# Patient Record
Sex: Male | Born: 1940 | Race: White | Hispanic: No | Marital: Married | Smoking: Former smoker
Health system: Southern US, Community
[De-identification: ages and names within clinical notes are randomized; demographics above are authoritative.]

## PROBLEM LIST (undated history)

## (undated) DIAGNOSIS — I10 Essential (primary) hypertension: Secondary | ICD-10-CM

## (undated) DIAGNOSIS — K5190 Ulcerative colitis, unspecified, without complications: Secondary | ICD-10-CM

## (undated) DIAGNOSIS — C801 Malignant (primary) neoplasm, unspecified: Secondary | ICD-10-CM

## (undated) DIAGNOSIS — M1990 Unspecified osteoarthritis, unspecified site: Secondary | ICD-10-CM

## (undated) DIAGNOSIS — F419 Anxiety disorder, unspecified: Secondary | ICD-10-CM

## (undated) DIAGNOSIS — D689 Coagulation defect, unspecified: Secondary | ICD-10-CM

## (undated) DIAGNOSIS — E785 Hyperlipidemia, unspecified: Secondary | ICD-10-CM

## (undated) DIAGNOSIS — D126 Benign neoplasm of colon, unspecified: Secondary | ICD-10-CM

## (undated) HISTORY — DX: Coagulation defect, unspecified: D68.9

## (undated) HISTORY — DX: Benign neoplasm of colon, unspecified: D12.6

## (undated) HISTORY — DX: Hyperlipidemia, unspecified: E78.5

## (undated) HISTORY — PX: JOINT REPLACEMENT: SHX530

## (undated) HISTORY — DX: Malignant (primary) neoplasm, unspecified: C80.1

## (undated) HISTORY — DX: Essential (primary) hypertension: I10

## (undated) HISTORY — PX: COLONOSCOPY W/ BIOPSIES AND POLYPECTOMY: SHX1376

## (undated) HISTORY — PX: TOTAL HIP ARTHROPLASTY: SHX124

## (undated) HISTORY — DX: Unspecified osteoarthritis, unspecified site: M19.90

## (undated) HISTORY — DX: Anxiety disorder, unspecified: F41.9

## (undated) HISTORY — DX: Ulcerative colitis, unspecified, without complications: K51.90

---

## 1989-10-03 DIAGNOSIS — C679 Malignant neoplasm of bladder, unspecified: Secondary | ICD-10-CM

## 1989-10-03 HISTORY — PX: OTHER SURGICAL HISTORY: SHX169

## 1989-10-03 HISTORY — DX: Malignant neoplasm of bladder, unspecified: C67.9

## 2011-11-03 ENCOUNTER — Encounter: Payer: Self-pay | Admitting: *Deleted

## 2011-11-03 DIAGNOSIS — I10 Essential (primary) hypertension: Secondary | ICD-10-CM | POA: Insufficient documentation

## 2011-11-03 DIAGNOSIS — I2699 Other pulmonary embolism without acute cor pulmonale: Secondary | ICD-10-CM

## 2011-11-03 DIAGNOSIS — N3090 Cystitis, unspecified without hematuria: Secondary | ICD-10-CM

## 2011-11-03 DIAGNOSIS — I82409 Acute embolism and thrombosis of unspecified deep veins of unspecified lower extremity: Secondary | ICD-10-CM

## 2011-11-03 DIAGNOSIS — F419 Anxiety disorder, unspecified: Secondary | ICD-10-CM

## 2011-11-03 DIAGNOSIS — M1990 Unspecified osteoarthritis, unspecified site: Secondary | ICD-10-CM | POA: Insufficient documentation

## 2011-11-04 ENCOUNTER — Ambulatory Visit (INDEPENDENT_AMBULATORY_CARE_PROVIDER_SITE_OTHER): Payer: Medicare Other | Admitting: Family Medicine

## 2011-11-04 ENCOUNTER — Encounter: Payer: Self-pay | Admitting: Family Medicine

## 2011-11-04 VITALS — BP 120/77 | HR 69 | Temp 97.1°F | Resp 20 | Ht 73.0 in | Wt 209.0 lb

## 2011-11-04 DIAGNOSIS — M109 Gout, unspecified: Secondary | ICD-10-CM

## 2011-11-04 DIAGNOSIS — E785 Hyperlipidemia, unspecified: Secondary | ICD-10-CM

## 2011-11-04 DIAGNOSIS — E782 Mixed hyperlipidemia: Secondary | ICD-10-CM

## 2011-11-04 DIAGNOSIS — I10 Essential (primary) hypertension: Secondary | ICD-10-CM

## 2011-11-04 DIAGNOSIS — Z0000 Encounter for general adult medical examination without abnormal findings: Secondary | ICD-10-CM

## 2011-11-04 LAB — COMPREHENSIVE METABOLIC PANEL
AST: 16 U/L (ref 0–37)
Albumin: 4.7 g/dL (ref 3.5–5.2)
Alkaline Phosphatase: 70 U/L (ref 39–117)
BUN: 26 mg/dL — ABNORMAL HIGH (ref 6–23)
Creat: 1.12 mg/dL (ref 0.50–1.35)
Glucose, Bld: 99 mg/dL (ref 70–99)
Potassium: 3.9 mEq/L (ref 3.5–5.3)
Total Bilirubin: 0.6 mg/dL (ref 0.3–1.2)

## 2011-11-04 LAB — LIPID PANEL
HDL: 36 mg/dL — ABNORMAL LOW (ref >39)
LDL Cholesterol: 147 mg/dL — ABNORMAL HIGH (ref 0–99)
Total CHOL/HDL Ratio: 7.2 Ratio
Triglycerides: 382 mg/dL — ABNORMAL HIGH (ref <150)

## 2011-11-04 LAB — URIC ACID: Uric Acid, Serum: 5.9 mg/dL (ref 4.0–7.8)

## 2011-11-04 MED ORDER — FENOFIBRATE 145 MG PO TABS: 145.0000 mg | ORAL_TABLET | Freq: Every day | ORAL | Status: DC

## 2011-11-04 MED ORDER — LOSARTAN POTASSIUM-HCTZ 50-12.5 MG PO TABS: 1.0000 | ORAL_TABLET | Freq: Every day | ORAL | Status: DC

## 2011-11-04 NOTE — Progress Notes (Deleted)
  Subjective:    Patient ID: Paul Luna, male    DOB: 31-May-1941, 71 y.o.   MRN: [id number]  HPI    Review of Systems     Objective:   Physical Exam        Assessment & Plan:

## 2011-11-04 NOTE — Progress Notes (Signed)
Subjective:    Paul Luna is a 71 y.o. male who presents for Medicare Initial preventive examination. He presents a document which is a summary of results for ARIC (a study being conducted a [employer]) in which the patient has been an active participant for 25 years. (these will be scanned into the record).  Preventive Screening-Counseling & Management  Tobacco History  Smoking status  . Former Smoker  . Quit date: 10/03/1989  Smokeless tobacco  . Not on file    Problems Prior to Visit 1. 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 HTN- normal ECG  July 2012 4. Arthritis 5. 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 Gout 7. Chronic Anxiety  Current Problems (verified) Patient Active Problem List  Diagnoses  . Pulmonary emboli  . DVT (deep venous thrombosis)  . Hypertension  . Arthritis  . Anxiety  . Bladder infection    Medications Prior to Visit Current Outpatient Prescriptions on File Prior to Visit  Medication Sig Dispense Refill  . ALLOPURINOL PO Take 100 mg by mouth.       . Sertraline HCl (ZOLOFT PO) Take 100 mg by mouth daily.       . Warfarin Sodium (COUMADIN PO) Take 5 mg by mouth daily.         Current Medications (verified) Current Outpatient Prescriptions  Medication Sig Dispense Refill  . acetaminophen (TYLENOL) 100 MG/ML solution Take 10 mg/kg by mouth as needed.      . ALLOPURINOL PO Take 100 mg by mouth.       . fenofibrate (TRICOR) 145 MG tablet Take 1 tablet (145 mg total) by mouth daily.  90 tablet  1  . Sertraline HCl (ZOLOFT PO) Take 100 mg by mouth daily.       . Warfarin Sodium (COUMADIN PO) Take 5 mg by mouth daily.       Marland Kitchen losartan-hydrochlorothiazide (HYZAAR) 50-12.5 MG per tablet Take 1 tablet by mouth daily.  90 tablet  3     Allergies (verified) Iodine and Shellfish allergy   PAST HISTORY  Family  History Family History  Problem Relation Age of Onset  . Hypertension Mother   . Heart disease Mother 49  . Hypertension Father   . Arthritis Father   . Stroke Father 78  . Cancer Brother   . Heart disease Brother 30    Social History History  Substance Use Topics  . Smoking status: Former Smoker    Quit date: 10/03/1989  . Smokeless tobacco: Not on file  . Alcohol Use: No    Are there smokers in your home (other than you)?  No  Risk Factors Current exercise habits: Home exercise routine includes walking 30 minutes daily hrs.  Dietary issues discussed: Heart Healthy; Gout prevention.  Cardiac risk factors: advanced age (older than 55 for men, 43 for women), dyslipidemia, hypertension, male gender and smoking/ tobacco exposure.  Depression Screen      ( Beck's Depression Inventory was completed) (Note: if answer to either of the following is "Yes", a more complete depression screening is indicated)   Q1: Over the past two weeks, have you felt down, depressed or hopeless? No  Q2: Over the past two weeks, have you felt little interest or pleasure in doing things? No  Have you lost interest or pleasure in daily life? No  Do you often feel hopeless? No  Do you cry easily over simple problems? No  Activities of Daily Living In your present state of health, do you have any difficulty performing the following activities?:  Driving? No Managing money?  No Feeding yourself? No Getting from bed to chair? No Climbing a flight of stairs? No Preparing food and eating?: No Bathing or showering? No Getting dressed: No Getting to the toilet? No Using the toilet:No Moving around from place to place: No In the past year have you fallen or had a near fall?:No   Are you sexually active?  No  Do you have more than one partner?  No  Hearing Difficulties: No Do you often ask people to speak up or repeat themselves? No Do you experience ringing or noises in your ears? No Do you have  difficulty understanding soft or whispered voices? No   Do you feel that you have a problem with memory? No  Do you often misplace items? No  Do you feel safe at home?  No  Cognitive Testing  Alert? Yes  Normal Appearance?Yes  Oriented to person? No  Place? No   Time? Yes  Recall of three objects?   Not performed  Can perform simple calculations?  Not performed  Displays appropriate judgment?Yes  Can read the correct time from a watch face?Yes   Advanced Directives have been discussed with the patient? No   List the Names of Other Physician/Practitioners you currently use: 1.  Dr. Logan Bores- [facility]  Indicate any recent Medical Services you may have received from other than [facility] providers in the past year (date may be approximate).                                       Screening Tests Health Maintenance  Topic Date Due  . Tetanus/tdap  01/05/2011  . Zostavax  01/09/2003  . Pneumococcal Polysaccharide Vaccine Age 41 And Over  01/09/2008  . Influenza Vaccine  07/04/2011  . Colonoscopy  03/03/2021    All answers were reviewed with the patient and necessary referrals were made:  [facility], MD   11/04/2011   History reviewed: allergies, current medications, past family history, past medical history, past social history, past surgical history and problem list  Review of Systems A comprehensive review of systems was negative except as per patient history.    Objective:     Vision by Snellen chart: right eye:, left eye:pt has eye exam done by [employer]; he wears prescription lenses Blood pressure 120/77, pulse 69, temperature 97.1 F (36.2 C), temperature source Oral, resp. rate 20, height 6\' 1"  (1.854 m), weight 209 lb (94.802 kg). Body mass index is 27.57 kg/(m^2).  General appearance: alert, cooperative, appears stated age and no distress  HEENT: Negative  Neck: Supple, normal ROM; no Thyromegaly, no LAN, no JVD, no bruits  Resp: nl effort, clear in  all fields w/o wheezes or rales  CV: reg rate and rhythm; no murmurs, gallops, rubs  Abd: soft, flat, nontender, no masses or organomegaly; no hernias  GU: DRE- prostate slightly enlarged, nontender; no rectal masses  Skin: warm and dry; no abnormal lesions or rashes  Ms/Sk: nl ROM in all major joints w/o erythema, swelling, atrophy, crepitus, warmth  Back: nl ROM w/o pain to palpation; no CVA tenderness  Neuro: A&O; CN 2-12 grossly intact. Motor and sensory function grossly normal. Gait is normal. DTRs 2+/equal.  Cerebral function is grossly normal.   ARIC results: 06/15/2011             Echocardiogram:  LVEF 64.7%                                                      Spirometry: Normal                                                      Total Cholesterol: 217                                                      LDL- chol: 123                                                      HDL- chol: 34                                                      TGs: 299                                                      Uric acid: 5.8                                                      Serum creatinine: 1.21   Assessment:   1. Routine general medical examination at a health care facility  Maintaining good general health; pt has a question re: use of Xarelto for anticoagulation (instead of Warfarin)the patient state this medication has been in use in [place] for years and he is considering switching to this medication for the benefits of no monthly lab test ,no dietary restrictions. I think this is reasonsable but the decision ultimately lies with Mr. Carrico.  2. Hyperlipidemia  He is trying to maintain healthy nutrition and an active lifestyle. Will advise him of results and any needed changes.  3. Gout  Uric acid checked; continue diet restrictions.   4.   Hypertension                                           Well controlled; continue current medication.        Plan:    As  noted above, Mr. Venezia will continues  current medications and follow-up with Dr.Evans at [facility] as well as [facility] in [place], [place].   During the course of the visit the patient was educated and counseled about appropriate screening and preventive services including:    Advanced directives:status unknown.  Diet review for nutrition referral? Yes ____  Not Indicated __x__   Patient Instructions (the written plan) was given to the patient.  Medicare Attestation I have personally reviewed: The patient's medical and social history Their use of alcohol, tobacco or illicit drugs Their current medications and supplements The patient's functional ability including ADLs,fall risks, home safety risks, cognitive, and hearing and visual impairment Diet and physical activities Evidence for depression or mood disorders  The patient's weight, height, BMI, and visual acuity have been recorded in the chart.  I have made referrals, counseling, and provided education to the patient based on review of the above and I have provided the patient with a written personalized care plan for preventive services.     Dow Adolph, MD   11/04/2011

## 2011-11-04 NOTE — Patient Instructions (Signed)
Today, we addressed Health Care Maintenance  And I feel that you are in good health and are compliant with your medications to insure control of your health issues. I refilled the Losartan- HCTZ and increased your Fenofibrate dose to 145 mg. Remember to separate  Warfarin from your other medications. Also begin a Vitamin D Supplement- 1000 IU daily is a good dose. You can let me know what you decide re: Xarelto.  Take care and it was a pleasure meeting you!

## 2011-11-07 ENCOUNTER — Encounter: Payer: Self-pay | Admitting: Family Medicine

## 2011-11-07 ENCOUNTER — Other Ambulatory Visit: Payer: Self-pay | Admitting: Family Medicine

## 2011-11-07 DIAGNOSIS — E785 Hyperlipidemia, unspecified: Secondary | ICD-10-CM

## 2011-11-07 NOTE — Progress Notes (Addendum)
Quick Note:  You labs are abnormal. Please call pt and notify him that his lipids are elevated. I recommend he start a medication to lower these values. I will route a prescription to his pharmacy for Pravastatin 20 mg for 4 months and schedule repeat Fasting Lipids in 8 weeks.  His uric acid level (Gout ) is normal.  After further review of the pt's meds and labs, I will not be prescribing Pravastatin 20 mg; at the visit on 11/03/10, Fenofibrate was increased to 145 mg; the pt will take this increased dose and return, as scheduled, for repeat labs in 8 weeks.  ______

## 2012-01-14 ENCOUNTER — Ambulatory Visit (INDEPENDENT_AMBULATORY_CARE_PROVIDER_SITE_OTHER): Payer: Medicare Other | Admitting: Family Medicine

## 2012-01-14 VITALS — BP 110/63 | HR 89 | Temp 98.8°F | Resp 16 | Ht 72.5 in | Wt 217.0 lb

## 2012-01-14 DIAGNOSIS — H6090 Unspecified otitis externa, unspecified ear: Secondary | ICD-10-CM

## 2012-01-14 DIAGNOSIS — H60399 Other infective otitis externa, unspecified ear: Secondary | ICD-10-CM

## 2012-01-14 DIAGNOSIS — L0390 Cellulitis, unspecified: Secondary | ICD-10-CM

## 2012-01-14 DIAGNOSIS — H6010 Cellulitis of external ear, unspecified ear: Secondary | ICD-10-CM

## 2012-01-14 DIAGNOSIS — H9209 Otalgia, unspecified ear: Secondary | ICD-10-CM

## 2012-01-14 DIAGNOSIS — L0291 Cutaneous abscess, unspecified: Secondary | ICD-10-CM

## 2012-01-14 MED ORDER — NEOMYCIN-POLYMYXIN-HC 3.5-10000-1 OT SOLN: 3.0000 [drp] | Freq: Three times a day (TID) | OTIC | Status: AC

## 2012-01-14 MED ORDER — DOXYCYCLINE HYCLATE 100 MG PO TABS: 100.0000 mg | ORAL_TABLET | Freq: Two times a day (BID) | ORAL | Status: AC

## 2012-01-14 MED ORDER — HYDROCODONE-ACETAMINOPHEN 5-500 MG PO TABS: 1.0000 | ORAL_TABLET | Freq: Three times a day (TID) | ORAL | Status: AC | PRN

## 2012-01-14 NOTE — Progress Notes (Signed)
  Subjective:    Patient ID: Paul Luna, male    DOB: 10/02/1941, 71 y.o.   MRN: [id number]  HPI 71 yo male on coumadin for h/o dvt with pe here with ear pain. Right.  Usually wears hearing aid in that ear.  Swollen, inflamed, throbbed.  Going on 4 days.  Hearing feels blocked.  NO fever.     Review of Systems Negative except as per HPI     Objective:   Physical Exam  Constitutional: He appears well-developed.  Pulmonary/Chest: Effort normal.  Neurological: He is alert.    Right ear - pinna red and inflamed.  Canal edematous - swollen shut.  Unable to see TM.  TTP and movement.   Ear wick inserted with small alligator forceps and 4 gtts of cortisporin otic instilled.       Assessment & Plan:  Ear pain Otitis externa Cellulitis  Cortisporin otic, doxy, and vicodin 5 #10.  F/u in 48 hours to have wick checked/removed if indicated.

## 2012-01-17 ENCOUNTER — Ambulatory Visit (INDEPENDENT_AMBULATORY_CARE_PROVIDER_SITE_OTHER): Payer: Medicare Other | Admitting: Family Medicine

## 2012-01-17 VITALS — BP 117/67 | HR 77 | Temp 98.0°F | Resp 16 | Ht 74.0 in | Wt 218.0 lb

## 2012-01-17 DIAGNOSIS — H6090 Unspecified otitis externa, unspecified ear: Secondary | ICD-10-CM

## 2012-01-17 DIAGNOSIS — H60399 Other infective otitis externa, unspecified ear: Secondary | ICD-10-CM

## 2012-01-17 NOTE — Progress Notes (Signed)
  Patient Name: Paul Luna Date of Birth: Jan 02, 1941 Medical Record Number: [id number] Gender: male Date of Encounter: 01/17/2012  History of Present Illness:  Paul Luna is a 71 y.o. very pleasant male patient who presents with the following:  Here today to recheck a right ear OE.   See last OV from 4/13- he had an ear wick placed for severe OE at that time.  The wick fell out yesterday and he feels that he is making steady progress. Pain is much less, hearing is better, ear canal is much less swollen and redness of pinna has resolved  Patient Active Problem List  Diagnoses  . Pulmonary emboli  . DVT (deep venous thrombosis)  . Hypertension  . Arthritis  . Anxiety  . Bladder infection   Past Medical History  Diagnosis Date  . Hypertension   . Arthritis   . 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 Anxiety     chronic   Past Surgical History  Procedure Date  . Cancer bladder 1991    managed by urologist  . Total hip arthroplasty     right   History  Substance Use Topics  . Smoking status: Former Smoker    Quit date: 10/03/1989  . Smokeless tobacco: Not on file  . Alcohol Use: No   Family History  Problem Relation Age of Onset  . Hypertension Mother   . Heart disease Mother 60  . Hypertension Father   . Arthritis Father   . Stroke Father 26  . Cancer Brother   . Heart disease Brother 81   Allergies  Allergen Reactions  . Iodine     childhood  . Shellfish Allergy Nausea And Vomiting    Gastrointestinal    Medication list has been reviewed and updated.  Review of Systems: As per HPI- otherwise negative. Feels that he is getting better- hearing in right ear better although he is not yet able to wear his hearing aid  Physical Examination: Filed Vitals:   01/17/12 0803  BP: 117/67  Pulse: 77  Temp: 98 F (36.7 C)  TempSrc: Oral  Resp: 16  Height: 6\' 2"  (1.88 m)  Weight: 218 lb (98.884 kg)    Body mass index is 27.99 kg/(m^2).  GEN: WDWN, NAD, Non-toxic, A  & O x 3 HEENT: Atraumatic, Normocephalic. Neck supple. No masses, No LAD.  Right ear: some ear canal edema but easily able to visualize TM which is intact.  No redness or swelling of pinna, no tenderness with external ear movement.  TM is dull with debris but is not red or bulging.  Left ear wnl internally and externally.   Oropharynx wnl, PEERL Ears and Nose: No external deformity. CV: RRR, No M/G/R. No JVD. No thrill. No extra heart sounds. PULM: CTA B, no wheezes, crackles, rhonchi. No retractions. No resp. distress. No accessory muscle use. ABD: S, NT, ND, +BS. No rebound. No HSM. EXTR: No c/c/e NEURO Normal gait.  PSYCH: Normally interactive. Conversant. Not depressed or anxious appearing.  Calm demeanor.    Assessment and Plan: 1. Otitis externa    Getting better-continue current regimen.  No need to replace wick today.  If not continuing to improve please let us know!  Sooner if worse.

## 2012-04-02 ENCOUNTER — Encounter: Payer: Self-pay | Admitting: Family Medicine

## 2012-04-16 ENCOUNTER — Telehealth: Payer: Self-pay

## 2012-04-16 NOTE — Telephone Encounter (Signed)
PT REQUESTING ALLOPURINAL 300 MG QTY 30,WE HAVE NOT FILLED THIS RX BEFORE,BUT DR MACPHERSON IS AWARE PT IS ON MED.   BEST PHONE NUMBER [phone]  PHARMACY [facility] @ [phone]

## 2012-04-17 MED ORDER — ALLOPURINOL 300 MG PO TABS: 300.0000 mg | ORAL_TABLET | Freq: Every day | ORAL | Status: DC

## 2012-04-17 NOTE — Telephone Encounter (Signed)
Dr. Audria Nine,   Do you want to RF?

## 2012-04-17 NOTE — Telephone Encounter (Signed)
I called the pharmacy to verify the dose ( Allopurinol 100 mg is dose on medication profile); pharmacy verified  Allopurinol 300 mg for last few months. I will authorize RFs.

## 2012-04-19 ENCOUNTER — Other Ambulatory Visit: Payer: Self-pay | Admitting: Family Medicine

## 2012-04-24 ENCOUNTER — Telehealth: Payer: Self-pay | Admitting: Family Medicine

## 2012-04-26 NOTE — Telephone Encounter (Signed)
n

## 2012-05-23 ENCOUNTER — Telehealth: Payer: Self-pay

## 2012-05-23 MED ORDER — SERTRALINE HCL 100 MG PO TABS: 100.0000 mg | ORAL_TABLET | Freq: Every day | ORAL | Status: DC

## 2012-05-23 NOTE — Telephone Encounter (Signed)
Was here in Feb for physical with Dr Audria Nine.

## 2012-05-23 NOTE — Telephone Encounter (Signed)
Pt was taking this medication at the time of his CPE in Feb 2013; I will e-prescribe refills for this medication. Please notify pt.

## 2012-05-23 NOTE — Telephone Encounter (Signed)
DR [facility]  PT WOULD LIKE A REFILL FOR HIS ZOLOFT 100 MG.  W/ 90 SUPPLY.  DR [facility] HAS NOT PRESCRIBED FOR THIS BEFORE, HE HAS GOTTEN FROM ANOTHER DR. TOLD HIM THAT HE WOULD PROBABLY NEED TO BE SEEN.

## 2012-05-23 NOTE — Telephone Encounter (Signed)
Have called patient to advise.  

## 2012-05-23 NOTE — Telephone Encounter (Signed)
Will forward to Dr. Audria Nine for review.

## 2012-06-02 ENCOUNTER — Other Ambulatory Visit: Payer: Self-pay | Admitting: Physician Assistant

## 2012-06-22 ENCOUNTER — Encounter: Payer: Self-pay | Admitting: Family Medicine

## 2012-06-22 ENCOUNTER — Ambulatory Visit (INDEPENDENT_AMBULATORY_CARE_PROVIDER_SITE_OTHER): Payer: Medicare Other | Admitting: Family Medicine

## 2012-06-22 VITALS — BP 108/71 | HR 86 | Temp 97.8°F | Resp 16 | Ht 72.75 in | Wt 214.4 lb

## 2012-06-22 DIAGNOSIS — I10 Essential (primary) hypertension: Secondary | ICD-10-CM

## 2012-06-22 DIAGNOSIS — Z23 Encounter for immunization: Secondary | ICD-10-CM

## 2012-06-22 DIAGNOSIS — E785 Hyperlipidemia, unspecified: Secondary | ICD-10-CM

## 2012-06-22 DIAGNOSIS — M109 Gout, unspecified: Secondary | ICD-10-CM | POA: Insufficient documentation

## 2012-06-22 LAB — BASIC METABOLIC PANEL
BUN: 36 mg/dL — ABNORMAL HIGH (ref 6–23)
Creat: 1.23 mg/dL (ref 0.50–1.35)

## 2012-06-22 LAB — ALT: ALT: 15 U/L (ref 0–53)

## 2012-06-22 LAB — LIPID PANEL: Cholesterol: 242 mg/dL — ABNORMAL HIGH (ref 0–200)

## 2012-06-22 MED ORDER — FENOFIBRATE 145 MG PO TABS: 145.0000 mg | ORAL_TABLET | Freq: Every day | ORAL | Status: DC

## 2012-06-22 MED ORDER — ALLOPURINOL 300 MG PO TABS: 300.0000 mg | ORAL_TABLET | Freq: Every day | ORAL | Status: DC

## 2012-06-22 NOTE — Progress Notes (Signed)
S: This pleasant 71 y.o. Cauc male is here for medication refill and labs for HTN and hyperlipidemia. He feels good and has new twin granddaughters. He has improved lifestyle choices and is compliant with medications. PT/INR is checked monthly at [facility]/ Cardiologist.  ROS: Noncontributory.  O: Filed Vitals:   06/22/12 0908  BP: 108/71  Pulse: 86  Temp: 97.8 F (36.6 C)  Resp: 16   GEN: In NAD; WN,WD. HENT: /AT: EOMI, conj/scl clear COR: RRR LUNGS; Normal resp rate and effort. NEURO: A&O x 3; CNs intact; otherwise normal.  A/P: 1. HTN (hypertension)  Basic metabolic panel  2. Hyperlipidemia  ALT, Lipid panel  3. Immunization due  Flu vaccine greater than or equal to 3yo preservative free IM  Pt advised to have Pneumovax but he declines to have it today. He will return to have it within next year (probably near next birthday).

## 2012-06-22 NOTE — Patient Instructions (Signed)
You are being reminded to have Pneumonia vaccine within the next 12 months. You will have a handout given about this vaccine.

## 2012-06-24 NOTE — Progress Notes (Signed)
Quick Note:  Please call pt and advise that the following labs are abnormal... Compared to your results from Sept 2012, your lipid profile is significantly worse. The values are only slightly better than 7 months ago. Continue your medication and aggressively change your nutrition habits. Maintain regular exercise habits; these values will be rechecked at your next visit. If there is no significant improvement /reversal of these numbers, medication change will have to occur.   Copy to pt. ______

## 2012-07-17 ENCOUNTER — Telehealth: Payer: Self-pay

## 2012-07-17 NOTE — Telephone Encounter (Signed)
The following email was submitted to your website from Paul Luna I would appreciate the posting on MyChart of the lipid panel and any other test results from my office visit on 06/22/2012.  Many thanks.  Date of birth: 01-16-1941

## 2012-07-17 NOTE — Telephone Encounter (Signed)
I sent him messages in my chart. Amy

## 2012-07-25 ENCOUNTER — Encounter: Payer: Self-pay | Admitting: Radiology

## 2012-11-03 ENCOUNTER — Telehealth: Payer: Self-pay

## 2012-11-03 ENCOUNTER — Other Ambulatory Visit: Payer: Self-pay | Admitting: Family Medicine

## 2012-11-03 NOTE — Telephone Encounter (Signed)
Pt came by and requested warfarin,lorartan,sertraline Has an appointment on 12/12/12 [facility]/ [facility]   He will call the pharmacy and ask they send these via e-script  [phone]

## 2012-11-05 NOTE — Telephone Encounter (Signed)
Looks like this was already done.

## 2012-12-12 ENCOUNTER — Encounter: Payer: Self-pay | Admitting: Family Medicine

## 2012-12-12 ENCOUNTER — Ambulatory Visit (INDEPENDENT_AMBULATORY_CARE_PROVIDER_SITE_OTHER): Payer: Medicare Other | Admitting: Family Medicine

## 2012-12-12 VITALS — BP 130/70 | HR 69 | Temp 98.5°F | Resp 16 | Ht 73.0 in | Wt 216.4 lb

## 2012-12-12 DIAGNOSIS — E785 Hyperlipidemia, unspecified: Secondary | ICD-10-CM

## 2012-12-12 DIAGNOSIS — Z0000 Encounter for general adult medical examination without abnormal findings: Secondary | ICD-10-CM

## 2012-12-12 DIAGNOSIS — Z8551 Personal history of malignant neoplasm of bladder: Secondary | ICD-10-CM | POA: Insufficient documentation

## 2012-12-12 DIAGNOSIS — Z130 Encounter for screening for diseases of the blood and blood-forming organs and certain disorders involving the immune mechanism: Secondary | ICD-10-CM

## 2012-12-12 DIAGNOSIS — I10 Essential (primary) hypertension: Secondary | ICD-10-CM

## 2012-12-12 DIAGNOSIS — Z23 Encounter for immunization: Secondary | ICD-10-CM

## 2012-12-12 LAB — COMPREHENSIVE METABOLIC PANEL
Albumin: 4.7 g/dL (ref 3.5–5.2)
BUN: 28 mg/dL — ABNORMAL HIGH (ref 6–23)
Calcium: 9.3 mg/dL (ref 8.4–10.5)
Chloride: 106 mEq/L (ref 96–112)
Glucose, Bld: 96 mg/dL (ref 70–99)
Potassium: 4.1 mEq/L (ref 3.5–5.3)
Total Protein: 7.1 g/dL (ref 6.0–8.3)

## 2012-12-12 LAB — POCT URINALYSIS DIPSTICK
Glucose, UA: NEGATIVE
Ketones, UA: NEGATIVE
Leukocytes, UA: NEGATIVE
Spec Grav, UA: 1.02
Urobilinogen, UA: 0.2

## 2012-12-12 LAB — CBC WITH DIFFERENTIAL/PLATELET
Basophils Relative: 1 % (ref 0–1)
Hemoglobin: 14.9 g/dL (ref 13.0–17.0)
Lymphs Abs: 1.1 10*3/uL (ref 0.7–4.0)
MCHC: 35 g/dL (ref 30.0–36.0)
Monocytes Relative: 11 % (ref 3–12)
Neutro Abs: 3.3 10*3/uL (ref 1.7–7.7)
Neutrophils Relative %: 63 % (ref 43–77)
Platelets: 274 10*3/uL (ref 150–400)
RBC: 4.87 MIL/uL (ref 4.22–5.81)

## 2012-12-12 LAB — LIPID PANEL: Cholesterol: 234 mg/dL — ABNORMAL HIGH (ref 0–200)

## 2012-12-12 MED ORDER — SERTRALINE HCL 100 MG PO TABS: 100.0000 mg | ORAL_TABLET | Freq: Every day | ORAL | Status: DC

## 2012-12-12 MED ORDER — LOSARTAN POTASSIUM-HCTZ 50-12.5 MG PO TABS: 1.0000 | ORAL_TABLET | Freq: Every day | ORAL | Status: DC

## 2012-12-12 MED ORDER — FENOFIBRATE 145 MG PO TABS: 145.0000 mg | ORAL_TABLET | Freq: Every day | ORAL | Status: DC

## 2012-12-12 NOTE — Patient Instructions (Signed)
Keeping you healthy  Get these tests  Blood pressure- Have your blood pressure checked once a year by your healthcare provider.  Normal blood pressure is 120/80  Weight- Have your body mass index (BMI) calculated to screen for obesity.  BMI is a measure of body fat based on height and weight. You can also calculate your own BMI at [email or web address].  Cholesterol- Have your cholesterol checked every year.  Diabetes- Have your blood sugar checked regularly if you have high blood pressure, high cholesterol, have a family history of diabetes or if you are overweight.  Screening for Colon Cancer- Colonoscopy starting at age 57.  Screening may begin sooner depending on your family history and other health conditions. Follow up colonoscopy as directed by your Gastroenterologist.  Screening for Prostate Cancer- Both blood work (PSA) and a rectal exam help screen for Prostate Cancer.  Screening begins at age 70 with African-American men and at age 18 with Caucasian men.  Screening may begin sooner depending on your family history.  Take these medicines  Aspirin- One aspirin daily can help prevent Heart disease and Stroke.  Flu shot- Every fall.  Tetanus- Every 10 years. UTD- 2012.  Zostavax- Once after the age of 98 to prevent Shingles. You received a prescription to have this vaccine given at  [facility] or [address].  Pneumonia shot- Once after the age of 72; if you are younger than 51, ask your healthcare provider if you need a Pneumonia shot. You received this vaccine today.  Take these steps  Don't smoke- If you do smoke, talk to your doctor about quitting.  For tips on how to quit, go to www.smokefree.gov or call 1-800-QUIT-NOW.  Be physically active- Exercise 5 days a week for at least 30 minutes.  If you are not already physically active start slow and gradually work up to 30 minutes of moderate physical activity.  Examples of moderate activity include walking briskly,  mowing the yard, dancing, swimming, bicycling, etc.  Eat a healthy diet- Eat a variety of healthy food such as fruits, vegetables, low fat milk, low fat cheese, yogurt, lean meant, poultry, fish, beans, tofu, etc. For more information go to www.thenutritionsource.org  Drink alcohol in moderation- Limit alcohol intake to less than two drinks a day. Never drink and drive.  Dentist- Brush and floss twice daily; visit your dentist twice a year.  Depression- Your emotional health is as important as your physical health. If you're feeling down, or losing interest in things you would normally enjoy please talk to your healthcare provider.  Eye exam- Visit your eye doctor every year.  Safe sex- If you may be exposed to a sexually transmitted infection, use a condom.  Seat belts- Seat belts can save your life; always wear one.  Smoke/Carbon Monoxide detectors- These detectors need to be installed on the appropriate level of your home.  Replace batteries at least once a year.  Skin cancer- When out in the sun, cover up and use sunscreen 15 SPF or higher.  Violence- If anyone is threatening you, please tell your healthcare provider.  Living Will/ Health care power of attorney- Speak with your healthcare provider and family.   Remember to ask your Urologist of he does the stool test for blood (which should be done yearly) when he does your prostate exam.

## 2012-12-12 NOTE — Progress Notes (Signed)
Subjective:    Patient ID: Paul Luna, male    DOB: 14-Apr-1941, 72 y.o.   MRN: [id number]  HPI  This 72 y.o. Cauc male is here for [facility] annual exam; he feels well. Daily exercise involves  walking dogs for 30 minutes; weight reduction is one of his concerns.   He enjoys an occasion beer or glass of wine. He is married and wife is currently hospitalized w/  GI problem; she is having a colonoscopy this afternoon.   He has bladder cancer follow-up annually in [place], [place] with [facility]-  Dr. Orlinda Blalock. At last visit in October 2013, DRE , PSA and cysto were performed.  Pt thinks physician performed stool test for occult blood. Last colonoscopy was in [place], [place]  in June 2012 (normal).   Pt is on chronic anticoagulation (Warfarin); INR is checked every 6 weeks at [facility].    Review of Systems  Constitutional: Negative.   HENT: Negative.   Eyes: Negative.   Respiratory: Negative.   Cardiovascular: Negative.   Gastrointestinal: Negative.   Endocrine: Negative.   Genitourinary: Positive for frequency.  Musculoskeletal: Negative.   Skin: Negative.   Allergic/Immunologic: Negative.   Neurological: Negative.   Hematological: Negative.   Psychiatric/Behavioral: Negative.        Objective:   Physical Exam  Nursing note and vitals reviewed. Constitutional: He is oriented to person, place, and time. Vital signs are normal. He appears well-developed and well-nourished. No distress.  HENT:  Head: Normocephalic and atraumatic.  Right Ear: Hearing, external ear and ear canal normal. Tympanic membrane is scarred. Tympanic membrane is not erythematous and not retracted. No middle ear effusion.  Left Ear: Hearing, external ear and ear canal normal. Tympanic membrane is scarred. Tympanic membrane is not erythematous and not retracted.  No middle ear effusion.  Nose: Nose normal. No mucosal edema, nasal deformity or septal deviation.  Mouth/Throat:  Uvula is midline, oropharynx is clear and moist and mucous membranes are normal. No oral lesions. Normal dentition. No dental caries.  Eyes: Conjunctivae and EOM are normal. Pupils are equal, round, and reactive to light. No scleral icterus.  Neck: Normal range of motion. Neck supple. No JVD present. No thyromegaly present.  Cardiovascular: Normal rate, regular rhythm, normal heart sounds and intact distal pulses.  Exam reveals no gallop and no friction rub.   No murmur heard. Pulmonary/Chest: Effort normal and breath sounds normal. No respiratory distress.  Abdominal: Soft. Normal appearance and bowel sounds are normal. He exhibits no distension, no pulsatile midline mass and no mass. There is no hepatosplenomegaly. There is no tenderness. There is no guarding and no CVA tenderness. No hernia.  Genitourinary:  Deferred- sees Urologist once a year  Musculoskeletal: Normal range of motion. He exhibits no edema and no tenderness.  Lymphadenopathy:    He has no cervical adenopathy.  Neurological: He is alert and oriented to person, place, and time. He has normal strength. No cranial nerve deficit or sensory deficit. He exhibits normal muscle tone. Coordination and gait normal.  Reflex Scores:      Tricep reflexes are 1+ on the right side and 1+ on the left side.      Bicep reflexes are 1+ on the right side and 1+ on the left side.      Patellar reflexes are 1+ on the right side and 1+ on the left side. Skin: Skin is warm and dry. No rash noted. No erythema. No pallor.  Psychiatric: He  has a normal mood and affect. His behavior is normal. Judgment and thought content normal.    Results for orders placed in visit on 12/12/12  POCT URINALYSIS DIPSTICK      Result Value Range   Color, UA yellow     Clarity, UA clear     Glucose, UA neg     Bilirubin, UA neg     Ketones, UA neg     Spec Grav, UA 1.020     Blood, UA neg     pH, UA 6.0     Protein, UA neg     Urobilinogen, UA 0.2     Nitrite,  UA neg     Leukocytes, UA Negative           Assessment & Plan:  Routine general medical examination at a health care facility - Plan: POCT urinalysis dipstick, CANCELED: POCT UA - Microscopic Only  Other and unspecified hyperlipidemia - continue Fenofibrate; add Fish Oil capsule cap daily.  Plan: Lipid panel, Comprehensive metabolic panel  Essential hypertension, benign - Plan: CBC with Differential  Screening for endocrine, nutritional, metabolic and immunity disorder  Need for prophylactic vaccination against Streptococcus pneumoniae (pneumococcus) - Plan: Pneumococcal polysaccharide vaccine 23-valent greater than or equal to 2yo subcutaneous/IM   Meds ordered this encounter  Medications  . losartan-hydrochlorothiazide (HYZAAR) 50-12.5 MG per tablet    Sig: Take 1 tablet by mouth daily.    Dispense:  90 tablet    Refill:  3  . sertraline (ZOLOFT) 100 MG tablet    Sig: Take 1 tablet (100 mg total) by mouth daily.    Dispense:  90 tablet    Refill:  3

## 2012-12-14 ENCOUNTER — Encounter: Payer: Self-pay | Admitting: Family Medicine

## 2013-02-18 ENCOUNTER — Other Ambulatory Visit: Payer: Self-pay | Admitting: Family Medicine

## 2013-04-02 ENCOUNTER — Other Ambulatory Visit: Payer: Self-pay | Admitting: Neurosurgery

## 2013-04-02 ENCOUNTER — Ambulatory Visit: Payer: Medicare Other

## 2013-04-02 ENCOUNTER — Ambulatory Visit (INDEPENDENT_AMBULATORY_CARE_PROVIDER_SITE_OTHER): Payer: Medicare Other | Admitting: Family Medicine

## 2013-04-02 VITALS — BP 122/74 | HR 66 | Temp 97.7°F | Resp 16 | Ht 73.0 in | Wt 217.0 lb

## 2013-04-02 DIAGNOSIS — M5430 Sciatica, unspecified side: Secondary | ICD-10-CM

## 2013-04-02 DIAGNOSIS — M216X9 Other acquired deformities of unspecified foot: Secondary | ICD-10-CM

## 2013-04-02 DIAGNOSIS — M21379 Foot drop, unspecified foot: Secondary | ICD-10-CM

## 2013-04-02 DIAGNOSIS — M549 Dorsalgia, unspecified: Secondary | ICD-10-CM

## 2013-04-02 NOTE — Patient Instructions (Addendum)
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 Go see the neuro surgeon today at [facility]. They are at [address].    Sciatica Sciatica is pain, weakness, numbness, or tingling along the path of the sciatic nerve. The nerve starts in the lower back and runs down the back of each leg. The nerve controls the muscles in the lower leg and in the back of the knee, while also providing sensation to the back of the thigh, lower leg, and the sole of your foot. Sciatica is a symptom of another medical condition. For instance, nerve damage or certain conditions, such as a herniated disk or bone spur on the spine, pinch or put pressure on the sciatic nerve. This causes the pain, weakness, or other sensations normally associated with sciatica. Generally, sciatica only affects one side of the body. CAUSES   Herniated or slipped disc.  Degenerative disk disease.  A pain disorder involving the narrow muscle in the buttocks (piriformis syndrome).  Pelvic injury or fracture.  Pregnancy.  Tumor (rare). SYMPTOMS  Symptoms can vary from mild to very severe. The symptoms usually travel from the low back to the buttocks and down the back of the leg. Symptoms can include:  Mild tingling or dull aches in the lower back, leg, or hip.  Numbness in the back of the calf or sole of the foot.  Burning sensations in the lower back, leg, or hip.  Sharp pains in the lower back, leg, or hip.  Leg weakness.  Severe back pain inhibiting movement. These symptoms may get worse with coughing, sneezing, laughing, or prolonged sitting or standing. Also, being overweight may worsen symptoms. DIAGNOSIS  Your caregiver will perform a physical exam to look for common symptoms of sciatica. He or she may ask you to do certain movements or activities that would trigger sciatic nerve pain. Other tests may be performed to find the cause of the sciatica. These may include:  Blood  tests.  X-rays.  Imaging tests, such as an MRI or CT scan. TREATMENT  Treatment is directed at the cause of the sciatic pain. Sometimes, treatment is not necessary and the pain and discomfort goes away on its own. If treatment is needed, your caregiver may suggest:  Over-the-counter medicines to relieve pain.  Prescription medicines, such as anti-inflammatory medicine, muscle relaxants, or narcotics.  Applying heat or ice to the painful area.  Steroid injections to lessen pain, irritation, and inflammation around the nerve.  Reducing activity during periods of pain.  Exercising and stretching to strengthen your abdomen and improve flexibility of your spine. Your caregiver may suggest losing weight if the extra weight makes the back pain worse.  Physical therapy.  Surgery to eliminate what is pressing or pinching the nerve, such as a bone spur or part of a herniated disk. HOME CARE INSTRUCTIONS   Only take over-the-counter or prescription medicines for pain or discomfort as directed by your caregiver.  Apply ice to the affected area for 20 minutes, 3 4 times a day for the first 48 72 hours. Then try heat in the same way.  Exercise, stretch, or perform your usual activities if these do not aggravate your pain.  Attend physical therapy sessions as directed by your caregiver.  Keep all follow-up appointments as directed by your caregiver.  Do not wear high heels or shoes that do not provide proper support.  Check your mattress to see if it is too soft. A firm mattress may  lessen your pain and discomfort. SEEK IMMEDIATE MEDICAL CARE IF:   You lose control of your bowel or bladder (incontinence).  You have increasing weakness in the lower back, pelvis, buttocks, or legs.  You have redness or swelling of your back.  You have a burning sensation when you urinate.  You have pain that gets worse when you lie down or awakens you at night.  Your pain is worse than you have  experienced in the past.  Your pain is lasting longer than 4 weeks.  You are suddenly losing weight without reason. MAKE SURE YOU:  Understand these instructions.  Will watch your condition.  Will get help right away if you are not doing well or get worse. Document Released: 09/13/2001 Document Revised: 03/20/2012 Document Reviewed: 01/29/2012 [facility] Patient Information 2014 [place], [place].

## 2013-04-02 NOTE — Progress Notes (Addendum)
Is a 73 year old gentleman who is retired from [employer] and comes in with acute right sciatica x1 week and foot drop on the right x5 days. He's had sciatica in the past but has always cleared. Is the worst episode he's ever had. He has no history of trauma.  Patient does have an associated history of pulmonary emboli and is on Coumadin for this. He's had some increase in swelling of both lower extremities but he has no localized pain, redness, or tenderness in the calves.  Other than the foot drop, patient feels that he has some instability of gait and is worried that he might have his leg on the right given out on him.  Objective: No acute distress, very pleasant and articulate gentleman Positive straight-leg raising on the right, negative on the left. Calves are nontender but he does have mild pretibial and ankle edema bilaterally. Patient exhibits foot drop on the right and is unable to dorsiflex the right ankle. Reflexes are absent in both ankles and very weak in the knees. Range of motion of knees and ankle are normal. Patient is tender over the right SI joint and posterior superior iliac spine on the right. [facility] reading (PRIMARY) by  Dr. Darvis Croft-L/S spine films show disc space narrowing between L5 and S1 and some spondylosis between the 23.  Assessment: Acute foot drop with right sciatica  Plan: Urgent neurosurgical consultation today at 2:00. Signed, Elvina Sidle and.  Marland Kitchen

## 2013-04-03 ENCOUNTER — Ambulatory Visit
Admission: RE | Admit: 2013-04-03 | Discharge: 2013-04-03 | Disposition: A | Discharge status: Home / Self Care | Payer: Medicare Other | Source: Ambulatory Visit | Attending: Neurosurgery | Admitting: Neurosurgery

## 2013-04-03 ENCOUNTER — Encounter (HOSPITAL_COMMUNITY): Payer: Self-pay | Admitting: Pharmacy Technician

## 2013-04-03 ENCOUNTER — Other Ambulatory Visit: Payer: Self-pay | Admitting: Neurosurgery

## 2013-04-03 DIAGNOSIS — M549 Dorsalgia, unspecified: Secondary | ICD-10-CM

## 2013-04-04 ENCOUNTER — Encounter (HOSPITAL_COMMUNITY): Payer: Self-pay | Admitting: *Deleted

## 2013-04-04 NOTE — Progress Notes (Signed)
Pt denies SOB, chest pain, and being under the care of a cardiologist. Pt states that he had a stress test more than five years ago in [place]. Pt PCP made aware of [facility] results. Pt asked that his wife be notified by phone when he is ready to be discharged; Mrs. Mullens phone number is [phone].

## 2013-04-07 MED ORDER — CEFAZOLIN SODIUM-DEXTROSE 2-3 GM-% IV SOLR
2.0000 g | INTRAVENOUS | Status: AC
  Administered 2013-04-08: 2 g via INTRAVENOUS
  Filled 2013-04-07: qty 50

## 2013-04-08 ENCOUNTER — Inpatient Hospital Stay (HOSPITAL_COMMUNITY): Payer: Medicare Other

## 2013-04-08 ENCOUNTER — Observation Stay (HOSPITAL_COMMUNITY)
Admission: RE | Admit: 2013-04-08 | Discharge: 2013-04-08 | Disposition: A | Discharge status: Home / Self Care | Payer: Medicare Other | Source: Ambulatory Visit | Attending: Neurosurgery | Admitting: Neurosurgery

## 2013-04-08 ENCOUNTER — Encounter (HOSPITAL_COMMUNITY): Payer: Self-pay | Admitting: Anesthesiology

## 2013-04-08 ENCOUNTER — Encounter (HOSPITAL_COMMUNITY)
Admission: RE | Disposition: A | Discharge status: Home / Self Care | Payer: Self-pay | Source: Ambulatory Visit | Attending: Neurosurgery

## 2013-04-08 ENCOUNTER — Inpatient Hospital Stay (HOSPITAL_COMMUNITY): Payer: Medicare Other | Admitting: Anesthesiology

## 2013-04-08 DIAGNOSIS — Z7901 Long term (current) use of anticoagulants: Secondary | ICD-10-CM | POA: Insufficient documentation

## 2013-04-08 DIAGNOSIS — Z79899 Other long term (current) drug therapy: Secondary | ICD-10-CM | POA: Insufficient documentation

## 2013-04-08 DIAGNOSIS — I10 Essential (primary) hypertension: Secondary | ICD-10-CM | POA: Insufficient documentation

## 2013-04-08 DIAGNOSIS — M5126 Other intervertebral disc displacement, lumbar region: Principal | ICD-10-CM | POA: Insufficient documentation

## 2013-04-08 HISTORY — PX: LUMBAR LAMINECTOMY/DECOMPRESSION MICRODISCECTOMY: SHX5026

## 2013-04-08 LAB — CBC WITH DIFFERENTIAL/PLATELET
Basophils Absolute: 0 10*3/uL (ref 0.0–0.1)
Basophils Relative: 1 % (ref 0–1)
HCT: 44.7 % (ref 39.0–52.0)
MCHC: 34.9 g/dL (ref 30.0–36.0)
Monocytes Absolute: 0.8 10*3/uL (ref 0.1–1.0)
Neutro Abs: 6.1 10*3/uL (ref 1.7–7.7)
Neutrophils Relative %: 74 % (ref 43–77)
Platelets: 257 10*3/uL (ref 150–400)
RDW: 13.7 % (ref 11.5–15.5)

## 2013-04-08 LAB — PROTIME-INR: Prothrombin Time: 13.5 seconds (ref 11.6–15.2)

## 2013-04-08 LAB — BASIC METABOLIC PANEL
BUN: 32 mg/dL — ABNORMAL HIGH (ref 6–23)
Calcium: 9.5 mg/dL (ref 8.4–10.5)
Creatinine, Ser: 1.32 mg/dL (ref 0.50–1.35)
GFR calc Af Amer: 61 mL/min — ABNORMAL LOW (ref >90)
GFR calc non Af Amer: 53 mL/min — ABNORMAL LOW (ref >90)

## 2013-04-08 LAB — SURGICAL PCR SCREEN
MRSA, PCR: POSITIVE — AB
Staphylococcus aureus: POSITIVE — AB

## 2013-04-08 LAB — APTT: aPTT: 24 seconds (ref 24–37)

## 2013-04-08 SURGERY — LUMBAR LAMINECTOMY/DECOMPRESSION MICRODISCECTOMY 1 LEVEL
Anesthesia: General | Site: Back | Laterality: Right | Wound class: Clean

## 2013-04-08 MED ORDER — EPHEDRINE SULFATE 50 MG/ML IJ SOLN
INTRAMUSCULAR | Status: DC | PRN
  Administered 2013-04-08 (×2): 5 mg via INTRAVENOUS
  Administered 2013-04-08: 10 mg via INTRAVENOUS
  Administered 2013-04-08: 5 mg via INTRAVENOUS

## 2013-04-08 MED ORDER — THROMBIN 5000 UNITS EX SOLR
CUTANEOUS | Status: DC | PRN
  Administered 2013-04-08 (×2): 5000 [IU] via TOPICAL

## 2013-04-08 MED ORDER — MUPIROCIN 2 % EX OINT
TOPICAL_OINTMENT | Freq: Two times a day (BID) | CUTANEOUS | Status: DC
  Filled 2013-04-08: qty 22

## 2013-04-08 MED ORDER — LACTATED RINGERS IV SOLN
INTRAVENOUS | Status: DC | PRN
  Administered 2013-04-08 (×2): via INTRAVENOUS

## 2013-04-08 MED ORDER — HEMOSTATIC AGENTS (NO CHARGE) OPTIME
TOPICAL | Status: DC | PRN
  Administered 2013-04-08: 1 via TOPICAL

## 2013-04-08 MED ORDER — CYCLOBENZAPRINE HCL 10 MG PO TABS: 10.0000 mg | ORAL_TABLET | Freq: Three times a day (TID) | ORAL | Status: DC | PRN

## 2013-04-08 MED ORDER — MENTHOL 3 MG MT LOZG: 1.0000 | LOZENGE | OROMUCOSAL | Status: DC | PRN

## 2013-04-08 MED ORDER — BACITRACIN 50000 UNITS IM SOLR
INTRAMUSCULAR | Status: DC | PRN
  Administered 2013-04-08: 08:00:00

## 2013-04-08 MED ORDER — ACETAMINOPHEN 325 MG PO TABS: 650.0000 mg | ORAL_TABLET | ORAL | Status: DC | PRN

## 2013-04-08 MED ORDER — CEFAZOLIN SODIUM 1-5 GM-% IV SOLN
1.0000 g | Freq: Three times a day (TID) | INTRAVENOUS | Status: DC
  Administered 2013-04-08: 1 g via INTRAVENOUS
  Filled 2013-04-08 (×2): qty 50

## 2013-04-08 MED ORDER — OXYCODONE-ACETAMINOPHEN 5-325 MG PO TABS: 1.0000 | ORAL_TABLET | ORAL | Status: DC | PRN

## 2013-04-08 MED ORDER — PROMETHAZINE HCL 25 MG/ML IJ SOLN: 6.2500 mg | INTRAMUSCULAR | Status: DC | PRN

## 2013-04-08 MED ORDER — LIDOCAINE HCL (CARDIAC) 20 MG/ML IV SOLN
INTRAVENOUS | Status: DC | PRN
  Administered 2013-04-08: 100 mg via INTRAVENOUS

## 2013-04-08 MED ORDER — HYDROMORPHONE HCL PF 1 MG/ML IJ SOLN: 0.2500 mg | INTRAMUSCULAR | Status: DC | PRN

## 2013-04-08 MED ORDER — NEOSTIGMINE METHYLSULFATE 1 MG/ML IJ SOLN
INTRAMUSCULAR | Status: DC | PRN
  Administered 2013-04-08: 3 mg via INTRAVENOUS

## 2013-04-08 MED ORDER — HYDROMORPHONE HCL PF 1 MG/ML IJ SOLN
0.5000 mg | INTRAMUSCULAR | Status: DC | PRN
Start: 2013-04-08 — End: 2013-04-08

## 2013-04-08 MED ORDER — 0.9 % SODIUM CHLORIDE (POUR BTL) OPTIME
TOPICAL | Status: DC | PRN
  Administered 2013-04-08: 1000 mL

## 2013-04-08 MED ORDER — KETOROLAC TROMETHAMINE 30 MG/ML IJ SOLN
30.0000 mg | Freq: Four times a day (QID) | INTRAMUSCULAR | Status: DC
  Administered 2013-04-08: 30 mg via INTRAVENOUS
  Filled 2013-04-08: qty 1

## 2013-04-08 MED ORDER — ALUM & MAG HYDROXIDE-SIMETH 200-200-20 MG/5ML PO SUSP: 30.0000 mL | Freq: Four times a day (QID) | ORAL | Status: DC | PRN

## 2013-04-08 MED ORDER — OXYCODONE HCL 5 MG PO TABS: 5.0000 mg | ORAL_TABLET | Freq: Once | ORAL | Status: DC | PRN

## 2013-04-08 MED ORDER — HYDROCODONE-ACETAMINOPHEN 5-325 MG PO TABS: 1.0000 | ORAL_TABLET | ORAL | Status: DC | PRN

## 2013-04-08 MED ORDER — SODIUM CHLORIDE 0.9 % IJ SOLN
3.0000 mL | Freq: Two times a day (BID) | INTRAMUSCULAR | Status: DC
  Administered 2013-04-08: 3 mL via INTRAVENOUS

## 2013-04-08 MED ORDER — BUPIVACAINE HCL (PF) 0.25 % IJ SOLN
INTRAMUSCULAR | Status: DC | PRN
  Administered 2013-04-08: 20 mL

## 2013-04-08 MED ORDER — MUPIROCIN 2 % EX OINT
TOPICAL_OINTMENT | CUTANEOUS | Status: AC
  Administered 2013-04-08: 1 via NASAL
  Filled 2013-04-08: qty 22

## 2013-04-08 MED ORDER — ONDANSETRON HCL 4 MG/2ML IJ SOLN
INTRAMUSCULAR | Status: DC | PRN
  Administered 2013-04-08: 4 mg via INTRAVENOUS

## 2013-04-08 MED ORDER — BACITRACIN 50000 UNITS IM SOLR
INTRAMUSCULAR | Status: AC
  Filled 2013-04-08: qty 1

## 2013-04-08 MED ORDER — KETOROLAC TROMETHAMINE 30 MG/ML IJ SOLN
INTRAMUSCULAR | Status: DC | PRN
  Administered 2013-04-08: 30 mg via INTRAVENOUS

## 2013-04-08 MED ORDER — SENNA 8.6 MG PO TABS: 1.0000 | ORAL_TABLET | Freq: Two times a day (BID) | ORAL | Status: DC

## 2013-04-08 MED ORDER — ROCURONIUM BROMIDE 100 MG/10ML IV SOLN
INTRAVENOUS | Status: DC | PRN
  Administered 2013-04-08: 50 mg via INTRAVENOUS

## 2013-04-08 MED ORDER — SODIUM CHLORIDE 0.9 % IJ SOLN: 3.0000 mL | INTRAMUSCULAR | Status: DC | PRN

## 2013-04-08 MED ORDER — DEXAMETHASONE SODIUM PHOSPHATE 10 MG/ML IJ SOLN
10.0000 mg | INTRAMUSCULAR | Status: AC
  Administered 2013-04-08: 10 mg via INTRAVENOUS

## 2013-04-08 MED ORDER — ARTIFICIAL TEARS OP OINT
TOPICAL_OINTMENT | OPHTHALMIC | Status: DC | PRN
  Administered 2013-04-08: 1 via OPHTHALMIC

## 2013-04-08 MED ORDER — SODIUM CHLORIDE 0.9 % IV SOLN
INTRAVENOUS | Status: AC
  Filled 2013-04-08: qty 500

## 2013-04-08 MED ORDER — LIDOCAINE HCL 4 % MT SOLN
OROMUCOSAL | Status: DC | PRN
  Administered 2013-04-08: 4 mL via TOPICAL

## 2013-04-08 MED ORDER — DEXAMETHASONE SODIUM PHOSPHATE 10 MG/ML IJ SOLN
INTRAMUSCULAR | Status: AC
  Filled 2013-04-08: qty 1

## 2013-04-08 MED ORDER — ONDANSETRON HCL 4 MG/2ML IJ SOLN: 4.0000 mg | INTRAMUSCULAR | Status: DC | PRN

## 2013-04-08 MED ORDER — OXYCODONE HCL 5 MG/5ML PO SOLN: 5.0000 mg | Freq: Once | ORAL | Status: DC | PRN

## 2013-04-08 MED ORDER — GLYCOPYRROLATE 0.2 MG/ML IJ SOLN
INTRAMUSCULAR | Status: DC | PRN
  Administered 2013-04-08: 0.4 mg via INTRAVENOUS

## 2013-04-08 MED ORDER — ACETAMINOPHEN 650 MG RE SUPP: 650.0000 mg | RECTAL | Status: DC | PRN

## 2013-04-08 MED ORDER — PROPOFOL 10 MG/ML IV BOLUS
INTRAVENOUS | Status: DC | PRN
  Administered 2013-04-08: 200 mg via INTRAVENOUS

## 2013-04-08 MED ORDER — PHENOL 1.4 % MT LIQD: 1.0000 | OROMUCOSAL | Status: DC | PRN

## 2013-04-08 MED ORDER — FENTANYL CITRATE 0.05 MG/ML IJ SOLN
INTRAMUSCULAR | Status: DC | PRN
  Administered 2013-04-08: 100 ug via INTRAVENOUS
  Administered 2013-04-08: 50 ug via INTRAVENOUS

## 2013-04-08 MED ORDER — MIDAZOLAM HCL 5 MG/5ML IJ SOLN
INTRAMUSCULAR | Status: DC | PRN
  Administered 2013-04-08: 2 mg via INTRAVENOUS

## 2013-04-08 SURGICAL SUPPLY — 53 items
BAG DECANTER FOR FLEXI CONT (MISCELLANEOUS) ×2 IMPLANT
BENZOIN TINCTURE PRP APPL 2/3 (GAUZE/BANDAGES/DRESSINGS) ×2 IMPLANT
BLADE SURG ROTATE 9660 (MISCELLANEOUS) IMPLANT
BRUSH SCRUB EZ PLAIN DRY (MISCELLANEOUS) ×2 IMPLANT
BUR CUTTER 7.0 ROUND (BURR) ×2 IMPLANT
CANISTER SUCTION 2500CC (MISCELLANEOUS) ×2 IMPLANT
CLOTH BEACON ORANGE TIMEOUT ST (SAFETY) ×2 IMPLANT
CONT SPEC 4OZ CLIKSEAL STRL BL (MISCELLANEOUS) ×2 IMPLANT
DECANTER SPIKE VIAL GLASS SM (MISCELLANEOUS) ×2 IMPLANT
DERMABOND ADHESIVE PROPEN (GAUZE/BANDAGES/DRESSINGS) ×1
DERMABOND ADVANCED (GAUZE/BANDAGES/DRESSINGS) ×1
DERMABOND ADVANCED .7 DNX12 (GAUZE/BANDAGES/DRESSINGS) ×1 IMPLANT
DERMABOND ADVANCED .7 DNX6 (GAUZE/BANDAGES/DRESSINGS) ×1 IMPLANT
DRAPE LAPAROTOMY 100X72X124 (DRAPES) ×2 IMPLANT
DRAPE MICROSCOPE LEICA (MISCELLANEOUS) ×2 IMPLANT
DRAPE MICROSCOPE ZEISS OPMI (DRAPES) IMPLANT
DRAPE POUCH INSTRU U-SHP 10X18 (DRAPES) ×2 IMPLANT
DRAPE PROXIMA HALF (DRAPES) IMPLANT
DRAPE SURG 17X23 STRL (DRAPES) ×4 IMPLANT
DURAPREP 26ML APPLICATOR (WOUND CARE) ×2 IMPLANT
ELECT REM PT RETURN 9FT ADLT (ELECTROSURGICAL) ×2
ELECTRODE REM PT RTRN 9FT ADLT (ELECTROSURGICAL) ×1 IMPLANT
GAUZE SPONGE 4X4 16PLY XRAY LF (GAUZE/BANDAGES/DRESSINGS) IMPLANT
GLOVE BIOGEL PI IND STRL 7.0 (GLOVE) ×2 IMPLANT
GLOVE BIOGEL PI INDICATOR 7.0 (GLOVE) ×2
GLOVE ECLIPSE 8.5 STRL (GLOVE) ×2 IMPLANT
GLOVE EXAM NITRILE LRG STRL (GLOVE) IMPLANT
GLOVE EXAM NITRILE MD LF STRL (GLOVE) IMPLANT
GLOVE EXAM NITRILE XL STR (GLOVE) IMPLANT
GLOVE EXAM NITRILE XS STR PU (GLOVE) IMPLANT
GLOVE SS BIOGEL STRL SZ 6.5 (GLOVE) ×2 IMPLANT
GLOVE SUPERSENSE BIOGEL SZ 6.5 (GLOVE) ×2
GOWN BRE IMP SLV AUR LG STRL (GOWN DISPOSABLE) ×2 IMPLANT
GOWN BRE IMP SLV AUR XL STRL (GOWN DISPOSABLE) ×2 IMPLANT
GOWN STRL REIN 2XL LVL4 (GOWN DISPOSABLE) IMPLANT
KIT BASIN OR (CUSTOM PROCEDURE TRAY) ×2 IMPLANT
KIT ROOM TURNOVER OR (KITS) ×2 IMPLANT
NEEDLE HYPO 22GX1.5 SAFETY (NEEDLE) ×2 IMPLANT
NEEDLE SPNL 22GX3.5 QUINCKE BK (NEEDLE) ×2 IMPLANT
NS IRRIG 1000ML POUR BTL (IV SOLUTION) ×2 IMPLANT
PACK LAMINECTOMY NEURO (CUSTOM PROCEDURE TRAY) ×2 IMPLANT
PAD ARMBOARD 7.5X6 YLW CONV (MISCELLANEOUS) ×6 IMPLANT
RUBBERBAND STERILE (MISCELLANEOUS) ×4 IMPLANT
SPONGE GAUZE 4X4 12PLY (GAUZE/BANDAGES/DRESSINGS) ×2 IMPLANT
SPONGE SURGIFOAM ABS GEL SZ50 (HEMOSTASIS) ×2 IMPLANT
STRIP CLOSURE SKIN 1/2X4 (GAUZE/BANDAGES/DRESSINGS) ×2 IMPLANT
SUT VIC AB 2-0 CT1 18 (SUTURE) ×2 IMPLANT
SUT VIC AB 3-0 SH 8-18 (SUTURE) ×2 IMPLANT
SYR 20ML ECCENTRIC (SYRINGE) ×2 IMPLANT
TAPE CLOTH SURG 4X10 WHT LF (GAUZE/BANDAGES/DRESSINGS) ×2 IMPLANT
TOWEL OR 17X24 6PK STRL BLUE (TOWEL DISPOSABLE) ×2 IMPLANT
TOWEL OR 17X26 10 PK STRL BLUE (TOWEL DISPOSABLE) ×2 IMPLANT
WATER STERILE IRR 1000ML POUR (IV SOLUTION) ×2 IMPLANT

## 2013-04-08 NOTE — Anesthesia Postprocedure Evaluation (Signed)
Anesthesia Post Note  Patient: Paul Luna  Procedure(s) Performed: Procedure(s) (LRB): LUMBAR FOUR-FIVE LUMBAR LAMINECTOMY/DECOMPRESSION MICRODISCECTOMY 1 LEVEL (Right)  Anesthesia type: general  Patient location: PACU  Post pain: Pain level controlled  Post assessment: Patient's Cardiovascular Status Stable  Last Vitals:  Filed Vitals:   04/08/13 1001  BP: 157/89  Pulse: 64  Temp: 36.9 C  Resp: 18    Post vital signs: Reviewed and stable  Level of consciousness: sedated  Complications: No apparent anesthesia complications

## 2013-04-08 NOTE — Anesthesia Preprocedure Evaluation (Addendum)
Anesthesia Evaluation  Patient identified by MRN, date of birth, ID band Patient awake    Reviewed: Allergy & Precautions, H&P , NPO status , Patient's Chart, lab work & pertinent test results  Airway Mallampati: II TM Distance: >3 FB Neck ROM: Full    Dental  (+) Teeth Intact and Dental Advisory Given   Pulmonary neg pulmonary ROS, former smoker,    Pulmonary exam normal       Cardiovascular hypertension, Pt. on medications     Neuro/Psych PSYCHIATRIC DISORDERS Anxiety negative neurological ROS     GI/Hepatic negative GI ROS, Neg liver ROS,   Endo/Other  negative endocrine ROS  Renal/GU negative Renal ROS     Musculoskeletal   Abdominal   Peds  Hematology   Anesthesia Other Findings   Reproductive/Obstetrics                          Anesthesia Physical Anesthesia Plan  ASA: III  Anesthesia Plan: General   Post-op Pain Management:    Induction: Intravenous  Airway Management Planned: Oral ETT  Additional Equipment:   Intra-op Plan:   Post-operative Plan: Extubation in OR  Informed Consent: I have reviewed the patients History and Physical, chart, labs and discussed the procedure including the risks, benefits and alternatives for the proposed anesthesia with the patient or authorized representative who has indicated his/her understanding and acceptance.   Dental advisory given  Plan Discussed with: CRNA, Anesthesiologist and Surgeon  Anesthesia Plan Comments:        Anesthesia Quick Evaluation

## 2013-04-08 NOTE — Transfer of Care (Signed)
Immediate Anesthesia Transfer of Care Note  Patient: Paul Luna  Procedure(s) Performed: Procedure(s): LUMBAR FOUR-FIVE LUMBAR LAMINECTOMY/DECOMPRESSION MICRODISCECTOMY 1 LEVEL (Right)  Patient Location: PACU  Anesthesia Type:General  Level of Consciousness: awake, alert  and oriented  Airway & Oxygen Therapy: Patient Spontanous Breathing and Patient connected to face mask oxygen  Post-op Assessment: Report given to PACU RN  Post vital signs: Reviewed and stable  Complications: No apparent anesthesia complications

## 2013-04-08 NOTE — Brief Op Note (Signed)
04/08/2013  8:49 AM  PATIENT:  Paul Luna  72 y.o. male  PRE-OPERATIVE DIAGNOSIS:  Herniated Nucleus Pulposus  POST-OPERATIVE DIAGNOSIS:  * No post-op diagnosis entered *  PROCEDURE:  Procedure(s): LUMBAR FOUR-FIVE LUMBAR LAMINECTOMY/DECOMPRESSION MICRODISCECTOMY 1 LEVEL (Right)  SURGEON:  Surgeon(s) and Role:    * Temple Pacini, MD - Primary  PHYSICIAN ASSISTANT:   ASSISTANTS:    ANESTHESIA:   general  EBL:  Total I/O In: 1250 [I.V.:1250] Out: 50 [Blood:50]  BLOOD ADMINISTERED:none  DRAINS: none   LOCAL MEDICATIONS USED:  MARCAINE     SPECIMEN:  No Specimen  DISPOSITION OF SPECIMEN:  N/A  COUNTS:  YES  TOURNIQUET:  * No tourniquets in log *  DICTATION: .Dragon Dictation  PLAN OF CARE: Admit for overnight observation  PATIENT DISPOSITION:  PACU - hemodynamically stable.   Delay start of Pharmacological VTE agent (>24hrs) due to surgical blood loss or risk of bleeding: yes

## 2013-04-08 NOTE — Discharge Summary (Signed)
Physician Discharge Summary  Patient ID: Paul Luna MRN: [id number] DOB/AGE: 70-Dec-1942 70 y.o.  Admit date: 04/08/2013 Discharge date: 04/08/2013  Admission Diagnoses:  Discharge Diagnoses:  Active Problems:   * No active hospital problems. *   Discharged Condition: good  Hospital Course: Patient in the hospital where and when uncomplicated L4-5 laminotomy and microdiscectomy. Postoperative Z. very well. Back and lower trimming pain much improved. Strength much improved. Ready for discharge home.  Consults:  Significant Diagnostic Studies:   Treatments:   Discharge Exam: Blood pressure 146/76, pulse 80, temperature 97.4 F (36.3 C), temperature source Oral, resp. rate 18, height 6\' 1"  (1.854 m), weight 98.884 kg (218 lb), SpO2 94.00%. Awake and alert. Oriented and appropriate cranial nerve function intact. Motor exam intact except for some very minimal dorsiflexion weakness on the right. Sensory exam normal. Wound clean and dry. Chest and abdomen benign.  Disposition: Final discharge disposition not confirmed   Future Appointments Provider Department Dept Phone   06/19/2013 9:00 AM Maurice March, MD [facility] [phone]       Medication List         acetaminophen 325 MG tablet  Commonly known as:  TYLENOL  Take 650 mg by mouth every 6 (six) hours as needed for pain. As Needed     allopurinol 300 MG tablet  Commonly known as:  ZYLOPRIM  Take 1 tablet (300 mg total) by mouth daily.     ascorbic acid 500 MG tablet  Commonly known as:  VITAMIN C  Take 500 mg by mouth daily.     COUMADIN PO  Take 4 mg by mouth daily.     cyclobenzaprine 10 MG tablet  Commonly known as:  FLEXERIL  Take 1 tablet (10 mg total) by mouth 3 (three) times daily as needed for muscle spasms.     fenofibrate 145 MG tablet  Commonly known as:  TRICOR  Take 1 tablet (145 mg total) by mouth daily.     HYDROcodone-acetaminophen 5-325 MG per tablet  Commonly known as:   NORCO/VICODIN  Take 1-2 tablets by mouth every 4 (four) hours as needed.     KRILL OIL PO  Take 1 capsule by mouth daily.     losartan-hydrochlorothiazide 50-12.5 MG per tablet  Commonly known as:  HYZAAR  Take 1 tablet by mouth daily.     methylPREDNISolone 4 MG tablet  Commonly known as:  MEDROL  Take 4 mg by mouth daily.     oxycodone-acetaminophen 2.5-325 MG per tablet  Commonly known as:  PERCOCET  Take 1 tablet by mouth every 4 (four) hours as needed for pain.     sertraline 100 MG tablet  Commonly known as:  ZOLOFT  Take 1 tablet (100 mg total) by mouth daily.     VITAMIN D-3 PO  Take 1 tablet by mouth daily.           Follow-up Information   Follow up with Darean Rote A, MD. Call in 1 week. (ext [phone])    Contact information:   [address] [place] [address] [phone]       Signed: Julio Sicks A 04/08/2013, 5:24 PM

## 2013-04-08 NOTE — H&P (Signed)
Paul Luna is an 72 y.o. male.   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 Symptoms and ongoing pro over 2 weeks. Patient has not had marked dorsiflexion weakness during that time. He has not responded to conservative management. He has no left-sided symptoms. Back pain is recently minimal. 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  Past Medical History  Diagnosis Date  . Hypertension   . Arthritis   . 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 Anxiety     chronic  . Clotting disorder     Past Surgical History  Procedure Laterality Date  . Cancer bladder  1991    managed by urologist  . Total hip arthroplasty      right  . Joint replacement    . Colonoscopy w/ biopsies and polypectomy      Hx: of    Family History  Problem Relation Age of Onset  . Hypertension Mother   . Heart disease Mother 66  . Hypertension Father   . Arthritis Father   . Stroke Father 108  . Cancer Brother   . Heart disease Brother 75   Social History:  reports that he quit smoking about 23 years ago. He has never used smokeless tobacco. He reports that  drinks alcohol. He reports that he does not use illicit drugs.  Allergies:  Allergies  Allergen Reactions  . Iodine     childhood  . Shellfish Allergy Nausea And Vomiting    Gastrointestinal    Medications Prior to Admission  Medication Sig Dispense Refill  . acetaminophen (TYLENOL) 325 MG tablet Take 650 mg by mouth every 6 (six) hours as needed for pain. As Needed      . allopurinol (ZYLOPRIM) 300 MG tablet Take 1 tablet (300 mg total) by mouth daily.  90 tablet   3  . ascorbic acid (VITAMIN C) 500 MG tablet Take 500 mg by mouth daily.      . Cholecalciferol (VITAMIN D-3 PO) Take 1 tablet by mouth daily.      . fenofibrate (TRICOR) 145 MG tablet Take 1 tablet (145 mg total) by mouth daily.  90 tablet  3  . KRILL OIL PO Take 1 capsule by mouth daily.      Marland Kitchen losartan-hydrochlorothiazide (HYZAAR) 50-12.5 MG per tablet Take 1 tablet by mouth daily.  90 tablet  3  . methylPREDNISolone (MEDROL) 4 MG tablet Take 4 mg by mouth daily.      Marland Kitchen oxycodone-acetaminophen (PERCOCET) 2.5-325 MG per tablet Take 1 tablet by mouth every 4 (four) hours as needed for pain.      Marland Kitchen sertraline (ZOLOFT) 100 MG tablet Take 1 tablet (100 mg total) by mouth daily.  90 tablet  3  . Warfarin Sodium (COUMADIN PO) Take 4 mg by mouth daily.         Results for orders placed during the hospital encounter of 04/08/13 (from the past 48 hour(s))  BASIC METABOLIC PANEL     Status: Abnormal   Collection Time    04/08/13  6:25 AM      Result Value Range   Sodium 139  135 - 145 mEq/L   Potassium 4.4  3.5 -  5.1 mEq/L   Chloride 100  96 - 112 mEq/L   CO2 31  19 - 32 mEq/L   Glucose, Bld 111 (*) 70 - 99 mg/dL   BUN 32 (*) 6 - 23 mg/dL   Creatinine, Ser 1.61  0.50 - 1.35 mg/dL   Calcium 9.5  8.4 - 09.6 mg/dL   GFR calc non Af Amer 53 (*) >90 mL/min   GFR calc Af Amer 61 (*) >90 mL/min   Comment:            The eGFR has been calculated     using the CKD EPI equation.     This calculation has not been     validated in all clinical     situations.     eGFR's persistently     <90 mL/min signify     possible Chronic Kidney Disease.  CBC WITH DIFFERENTIAL     Status: None   Collection Time    04/08/13  6:25 AM      Result Value Range   WBC 8.2  4.0 - 10.5 K/uL   RBC 4.97  4.22 - 5.81 MIL/uL   Hemoglobin 15.6  13.0 - 17.0 g/dL   HCT 04.5  40.9 - 81.1 %   MCV 89.9  78.0 - 100.0 fL   MCH 31.4  26.0 - 34.0 pg   MCHC 34.9  30.0 - 36.0 g/dL   RDW 91.4  78.2 - 95.6 %   Platelets 257   150 - 400 K/uL   Neutrophils Relative % 74  43 - 77 %   Neutro Abs 6.1  1.7 - 7.7 K/uL   Lymphocytes Relative 14  12 - 46 %   Lymphs Abs 1.2  0.7 - 4.0 K/uL   Monocytes Relative 10  3 - 12 %   Monocytes Absolute 0.8  0.1 - 1.0 K/uL   Eosinophils Relative 2  0 - 5 %   Eosinophils Absolute 0.2  0.0 - 0.7 K/uL   Basophils Relative 1  0 - 1 %   Basophils Absolute 0.0  0.0 - 0.1 K/uL  PROTIME-INR     Status: None   Collection Time    04/08/13  6:46 AM      Result Value Range   Prothrombin Time 13.5  11.6 - 15.2 seconds   INR 1.05  0.00 - 1.49  APTT     Status: None   Collection Time    04/08/13  6:46 AM      Result Value Range   aPTT 24  24 - 37 seconds   Dg Chest 2 View  04/08/2013   *RADIOLOGY REPORT*  Clinical Data: Preop for lumbar surgery today.  Previous smoker.  CHEST - 2 VIEW  Comparison: None.  Findings: Mild hyperinflation suggesting emphysematous change. Increased density in the right cardiophrenic angle likely representing prominent fat pad or cyst.  No focal airspace consolidation in the lungs.  No blunting of costophrenic angles. No pneumothorax.  Degenerative changes in the spine.  IMPRESSION: No evidence of active pulmonary disease.   Original Report Authenticated By: Burman Nieves, M.D.    Review of Systems  Constitutional: Negative.   HENT: Negative.   Eyes: Negative.   Respiratory: Negative.   Cardiovascular: Negative.   Gastrointestinal: Negative.   Genitourinary: Negative.   Musculoskeletal: Negative.   Skin: Negative.   Neurological: Negative.   Endo/Heme/Allergies: Negative.   Psychiatric/Behavioral: Negative.     Blood pressure 145/89, pulse 68, temperature 98.1 F (36.7  C), temperature source Oral, resp. rate 18, height 6\' 1"  (1.854 m), weight 98.884 kg (218 lb), SpO2 95.00%. Physical Exam  Constitutional: He is oriented to person, place, and time. He appears well-developed and well-nourished. No distress.  HENT:  Head: Normocephalic and atraumatic.   Right Ear: External ear normal.  Left Ear: External ear normal.  Nose: Nose normal.  Mouth/Throat: Oropharynx is clear and moist.  Eyes: Conjunctivae and EOM are normal. Pupils are equal, round, and reactive to light. Right eye exhibits no discharge. Left eye exhibits no discharge.  Neck: Normal range of motion. Neck supple. No tracheal deviation present. No thyromegaly present.  Cardiovascular: Normal rate, regular rhythm, normal heart sounds and intact distal pulses.  Exam reveals no friction rub.   No murmur heard. Respiratory: Effort normal and breath sounds normal. No respiratory distress. He has no wheezes.  GI: Soft. Bowel sounds are normal. He exhibits no distension. There is no tenderness.  Musculoskeletal: Normal range of motion. He exhibits no edema and no tenderness.  Neurological: He is alert and oriented to person, place, and time. He has normal reflexes. No cranial nerve deficit. Coordination normal.  Skin: Skin is warm and dry. No rash noted. He is not diaphoretic. No erythema. No pallor.  Psychiatric: He has a normal mood and affect. His behavior is normal. Judgment and thought content normal.     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 Risks and benefits have been explained. Patient wishes to proceed.  Bodhi Moradi A 04/08/2013, 7:38 AM

## 2013-04-08 NOTE — Op Note (Signed)
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  Asst. Surgeon: None  Anesthesia: General  Indication: 72 year old male with back and right lower extremity pain paresthesias and weakness consistent with a right-sided L5 radiculopathy presents now for microdiscectomy in hopes of improving his symptoms.  Operative note: After induction anesthesia, patient positioned prone onto Wilson frame and appropriately padded. Murray region prepped and draped. Incision made overlying L4-5. Supper off Henderson Newcomer performed the right side exposing the lamina facet joints of L4 and L5. Deep self-retaining retractor was placed x-ray taken. L5-S1 exposed. Dissection redirected 1 level cephalad. Retractor replaced. Laminotomy performed using high-speed drill and Kerrison rongeurs. Ligament flavum elevated and resected. Underlying thecal sac and L5 nerve root identified. Microscope brought into the field these were microdissection of the L5 nerve root and underlying disc herniation. Epidural venous plexus was coagulated and cut. Thecal sac and nerve root gently mobilized and a moderately large right-sided L4-5 disc herniation with inferior free fragment was readily apparent. Fragment was dissected free and removed using pituitary rongeurs and blunt nerve hooks. The space and incised 15 blade in a rectangular fashion. Y. disc space clear was achieved using pituitary rongeurs operating theater rongeurs and Epstein curettes. All loose are obviously degenerative tear was removed and interspace. This point a very thorough discectomy had been achieved. There is no evidence of injury to thecal sac and nerve roots. Wound is then irrigated with and bike solution. Gelfoam was placed topically for  hemostasis which then the beaded. Microscope and retractor system were removed. Hemostasis of the muscle was achieved Elaina Hoops. Wounds and close in layers with Vicryl sutures. Steri-Strips and sterile dressing were applied. There were no apparent locations. Patient tolerated the procedure well and he returns to the recovery room postop.

## 2013-04-08 NOTE — Plan of Care (Signed)
Problem: Consults Goal: Diagnosis - Spinal Surgery Outcome: Completed/Met Date Met:  04/08/13 Microdiscectomy     

## 2013-04-08 NOTE — Progress Notes (Signed)
Pt. Alert and oriented,follows simple instructions, denies pain. Incision area without swelling, redness or S/S of infection. Voiding adequate clear yellow urine. Moving all extremities well and vitals stable and documented. Lumbar surgery notes instructions given to patient and family member for home safety and precautions. Pt. and family stated understanding of instructions given.  

## 2013-04-08 NOTE — Preoperative (Signed)
Beta Blockers   Reason not to administer Beta Blockers:Not Applicable 

## 2013-04-09 ENCOUNTER — Encounter (HOSPITAL_COMMUNITY): Payer: Self-pay | Admitting: Neurosurgery

## 2013-04-10 ENCOUNTER — Ambulatory Visit (HOSPITAL_COMMUNITY)
Admission: RE | Admit: 2013-04-10 | Discharge: 2013-04-10 | Disposition: A | Discharge status: Home / Self Care | Payer: Medicare Other | Source: Ambulatory Visit | Attending: Family Medicine | Admitting: Family Medicine

## 2013-04-10 ENCOUNTER — Ambulatory Visit (INDEPENDENT_AMBULATORY_CARE_PROVIDER_SITE_OTHER): Payer: Medicare Other | Admitting: Family Medicine

## 2013-04-10 VITALS — BP 122/78 | HR 90 | Temp 97.9°F | Resp 18 | Ht 73.0 in | Wt 218.0 lb

## 2013-04-10 DIAGNOSIS — I2699 Other pulmonary embolism without acute cor pulmonale: Secondary | ICD-10-CM

## 2013-04-10 DIAGNOSIS — M79609 Pain in unspecified limb: Secondary | ICD-10-CM | POA: Insufficient documentation

## 2013-04-10 DIAGNOSIS — M7989 Other specified soft tissue disorders: Secondary | ICD-10-CM

## 2013-04-10 DIAGNOSIS — Z7901 Long term (current) use of anticoagulants: Secondary | ICD-10-CM

## 2013-04-10 MED ORDER — ENOXAPARIN SODIUM 100 MG/ML ~~LOC~~ SOLN: 100.0000 mg | Freq: Two times a day (BID) | SUBCUTANEOUS | Status: DC

## 2013-04-10 MED ORDER — ENOXAPARIN SODIUM 100 MG/ML ~~LOC~~ SOLN
100.0000 mg | Freq: Once | SUBCUTANEOUS | Status: AC
  Administered 2013-04-10: 100 mg via SUBCUTANEOUS

## 2013-04-10 NOTE — Progress Notes (Signed)
VASCULAR LAB PRELIMINARY  PRELIMINARY  PRELIMINARY  PRELIMINARY  Left lower extremity venous duplex completed.    Preliminary report:  Left:  No evidence of DVT or Baker's cyst. Positive for a superficial thrombus noted in the left greater saphenous coursing from the knee to just distal to the saphenofemoral junction.  Bettina Warn, RVS 04/10/2013, 6:46 PM

## 2013-04-10 NOTE — Progress Notes (Signed)
Subjective:    Patient ID: Paul Luna, male    DOB: 15-Mar-1941, 72 y.o.   MRN: [id number] Chief Complaint  Patient presents with  . Follow-up   HPI  Was seen here 1 week previously for sciatica with foot drop - seen by Dr. Cala Bradford - had urgent neursurg eval with MRI same day showing disc impingement onto L4-5 nerve root.  Had surgery 2d ago and is doing great. Both weakness and numbness are starting to improve a little.  However, left foot is now swollen - first noticed today.  He also had a red rash on inner upper thigh this morning - was warm and firm.  Had chills last night and subjective fever this morning.  Had an unprovoked PE 4 years ago. Was hospitalized at [facility] with full w/u - no etiology found but his mother has also had DVTs and father had PVD.  Does not have any chest pain, tightness, palpitations, shortness of breath, cough - remembers these symptoms well from his prior PE and nothing like that at all.  INR is followed by [facility] - usu runs about 3.4 - 3.6. Stopped coumadin last Wed - 5d prior to surgery, restarted yesterday, no bridge - was told he didn't need one. Hasn't wanted to change to one of the new anticoag as he is so stable on his coumadin normally.  Past Medical History  Diagnosis Date  . Hypertension   . Arthritis   . 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 Anxiety     chronic  . Clotting disorder    Current Outpatient Prescriptions on File Prior to Visit  Medication Sig Dispense Refill  . acetaminophen (TYLENOL) 325 MG tablet Take 650 mg by mouth every 6 (six) hours as needed for pain. As Needed      . allopurinol (ZYLOPRIM) 300 MG tablet Take 1 tablet (300 mg total) by mouth daily.  90 tablet  3  . ascorbic acid (VITAMIN C) 500 MG tablet Take 500 mg by mouth daily.      . Cholecalciferol (VITAMIN D-3 PO) Take 1 tablet by mouth daily.      . cyclobenzaprine (FLEXERIL) 10 MG tablet Take 1 tablet (10 mg total) by mouth 3 (three) times daily as needed for muscle  spasms.  30 tablet  0  . fenofibrate (TRICOR) 145 MG tablet Take 1 tablet (145 mg total) by mouth daily.  90 tablet  3  . HYDROcodone-acetaminophen (NORCO/VICODIN) 5-325 MG per tablet Take 1-2 tablets by mouth every 4 (four) hours as needed.  30 tablet  0  . KRILL OIL PO Take 1 capsule by mouth daily.      Marland Kitchen losartan-hydrochlorothiazide (HYZAAR) 50-12.5 MG per tablet Take 1 tablet by mouth daily.  90 tablet  3  . methylPREDNISolone (MEDROL) 4 MG tablet Take 4 mg by mouth daily.      Marland Kitchen oxycodone-acetaminophen (PERCOCET) 2.5-325 MG per tablet Take 1 tablet by mouth every 4 (four) hours as needed for pain.      Marland Kitchen sertraline (ZOLOFT) 100 MG tablet Take 1 tablet (100 mg total) by mouth daily.  90 tablet  3  . Warfarin Sodium (COUMADIN PO) Take 4 mg by mouth daily.        No current facility-administered medications on file prior to visit.   Allergies  Allergen Reactions  . Iodine     childhood  . Shellfish Allergy Nausea And Vomiting    Gastrointestinal     Review of Systems  Constitutional: Positive for activity change. Negative for fever and chills.  Eyes: Negative for visual disturbance.  Respiratory: Negative for cough, chest tightness and shortness of breath.   Cardiovascular: Positive for leg swelling. Negative for chest pain and palpitations.  Musculoskeletal: Positive for myalgias, back pain, joint swelling and gait problem.  Skin: Positive for color change and rash.  Neurological: Positive for weakness and numbness. Negative for dizziness, syncope, facial asymmetry, light-headedness and headaches.  Hematological: Negative for adenopathy.      BP 122/78  Pulse 90  Temp(Src) 97.9 F (36.6 C) (Oral)  Resp 18  Ht 6\' 1"  (1.854 m)  Wt 218 lb (98.884 kg)  BMI 28.77 kg/m2  SpO2 96% Objective:   Physical Exam  Constitutional: He is oriented to person, place, and time. He appears well-developed and well-nourished. No distress.  HENT:  Head: Normocephalic and atraumatic.  Eyes:  No scleral icterus.  Pulmonary/Chest: Effort normal.  Musculoskeletal:  9 cm distal to tibial tuberosity left leg 41.5 cm right leg 43 cm Cap refill equal bilaterally at approx 2 secs 1+ pitting edema bilaterally. Upper left medial thigh with mild induration and VERY MILD erhythema  Neurological: He is alert and oriented to person, place, and time.  Skin: Skin is warm and dry. He is not diaphoretic.  Psychiatric: He has a normal mood and affect. His behavior is normal.     Pt sent to [facility] lab for stat doppler to r/o DVT -     Left lower ext venous doppler Preliminary report: Left: No evidence of DVT or Baker's cyst. Positive for a superficial thrombus noted in the left greater saphenous coursing from the knee to just distal to the saphenofemoral junction. 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  Pt and wife feel unable to administer lovenox so he plans to come to clinic bid for sq injection and will bring the lovenox w/ him  Meds ordered this encounter  Medications  . enoxaparin (LOVENOX) 100 MG/ML injection    Sig: Inject 1 mL (100 mg total) into the skin every 12 (twelve) hours.    Dispense:  6 mL    Refill:  0  . enoxaparin (LOVENOX) injection 100 mg    Sig:

## 2013-04-11 ENCOUNTER — Ambulatory Visit (INDEPENDENT_AMBULATORY_CARE_PROVIDER_SITE_OTHER): Payer: Medicare Other | Admitting: *Deleted

## 2013-04-11 DIAGNOSIS — I749 Embolism and thrombosis of unspecified artery: Secondary | ICD-10-CM

## 2013-04-11 DIAGNOSIS — I8290 Acute embolism and thrombosis of unspecified vein: Secondary | ICD-10-CM

## 2013-04-11 MED ORDER — ENOXAPARIN SODIUM 100 MG/ML ~~LOC~~ SOLN
100.0000 mg | Freq: Once | SUBCUTANEOUS | Status: AC
  Administered 2013-04-11: 100 mg via SUBCUTANEOUS

## 2013-04-11 NOTE — Addendum Note (Signed)
Addended by: Thelma Barge D on: 04/11/2013 06:33 PM   Modules accepted: Orders

## 2013-04-12 ENCOUNTER — Ambulatory Visit (INDEPENDENT_AMBULATORY_CARE_PROVIDER_SITE_OTHER): Payer: Medicare Other | Admitting: *Deleted

## 2013-04-12 VITALS — BP 100/80 | HR 85 | Temp 98.0°F | Resp 16

## 2013-04-12 DIAGNOSIS — I82409 Acute embolism and thrombosis of unspecified deep veins of unspecified lower extremity: Secondary | ICD-10-CM

## 2013-04-12 MED ORDER — ENOXAPARIN SODIUM 100 MG/ML ~~LOC~~ SOLN
100.0000 mg | Freq: Once | SUBCUTANEOUS | Status: AC
  Administered 2013-04-12: 100 mg via SUBCUTANEOUS

## 2013-04-13 ENCOUNTER — Ambulatory Visit (INDEPENDENT_AMBULATORY_CARE_PROVIDER_SITE_OTHER): Payer: Medicare Other | Admitting: Family Medicine

## 2013-04-13 DIAGNOSIS — I82402 Acute embolism and thrombosis of unspecified deep veins of left lower extremity: Secondary | ICD-10-CM

## 2013-04-13 DIAGNOSIS — I82812 Embolism and thrombosis of superficial veins of left lower extremities: Secondary | ICD-10-CM

## 2013-04-13 DIAGNOSIS — I82409 Acute embolism and thrombosis of unspecified deep veins of unspecified lower extremity: Secondary | ICD-10-CM

## 2013-04-13 DIAGNOSIS — Z7901 Long term (current) use of anticoagulants: Secondary | ICD-10-CM

## 2013-04-13 LAB — PROTIME-INR: INR: 1.16 (ref <1.50)

## 2013-04-13 MED ORDER — ENOXAPARIN SODIUM 100 MG/ML ~~LOC~~ SOLN
100.0000 mg | Freq: Once | SUBCUTANEOUS | Status: AC
  Administered 2013-04-13: 100 mg via SUBCUTANEOUS

## 2013-04-13 MED ORDER — ENOXAPARIN SODIUM 100 MG/ML ~~LOC~~ SOLN: 100.0000 mg | Freq: Two times a day (BID) | SUBCUTANEOUS | Status: DC

## 2013-04-14 ENCOUNTER — Ambulatory Visit (INDEPENDENT_AMBULATORY_CARE_PROVIDER_SITE_OTHER): Payer: Medicare Other | Admitting: Radiology

## 2013-04-14 DIAGNOSIS — Z7901 Long term (current) use of anticoagulants: Secondary | ICD-10-CM

## 2013-04-14 DIAGNOSIS — I82409 Acute embolism and thrombosis of unspecified deep veins of unspecified lower extremity: Secondary | ICD-10-CM

## 2013-04-14 MED ORDER — ENOXAPARIN SODIUM 100 MG/ML ~~LOC~~ SOLN
100.0000 mg | Freq: Once | SUBCUTANEOUS | Status: AC
  Administered 2013-04-14: 100 mg via SUBCUTANEOUS

## 2013-04-14 NOTE — Addendum Note (Signed)
Addended by: Eddie Candle on: 04/14/2013 05:23 PM   Modules accepted: Orders

## 2013-04-15 ENCOUNTER — Ambulatory Visit (INDEPENDENT_AMBULATORY_CARE_PROVIDER_SITE_OTHER): Payer: Medicare Other | Admitting: *Deleted

## 2013-04-15 VITALS — BP 113/71 | HR 72 | Temp 98.1°F | Resp 16

## 2013-04-15 DIAGNOSIS — I82402 Acute embolism and thrombosis of unspecified deep veins of left lower extremity: Secondary | ICD-10-CM

## 2013-04-15 DIAGNOSIS — I82409 Acute embolism and thrombosis of unspecified deep veins of unspecified lower extremity: Secondary | ICD-10-CM

## 2013-04-15 DIAGNOSIS — Z7901 Long term (current) use of anticoagulants: Secondary | ICD-10-CM

## 2013-04-15 LAB — PROTIME-INR: Prothrombin Time: 16 seconds — ABNORMAL HIGH (ref 11.6–15.2)

## 2013-04-15 MED ORDER — ENOXAPARIN SODIUM 100 MG/ML ~~LOC~~ SOLN
100.0000 mg | Freq: Once | SUBCUTANEOUS | Status: AC
  Administered 2013-04-15: 100 mg via SUBCUTANEOUS

## 2013-04-15 NOTE — Progress Notes (Signed)
  Subjective:    Patient ID: Paul Luna, male    DOB: Feb 20, 1941, 72 y.o.   MRN: [id number]  HPI Patient here for labs only.   Review of Systems     Objective:   Physical Exam        Assessment & Plan:

## 2013-04-16 NOTE — Progress Notes (Signed)
Here for lovenox inj and INR - was not seen by a provider.

## 2013-04-18 ENCOUNTER — Ambulatory Visit (INDEPENDENT_AMBULATORY_CARE_PROVIDER_SITE_OTHER): Payer: Medicare Other | Admitting: Family Medicine

## 2013-04-18 ENCOUNTER — Encounter: Payer: Self-pay | Admitting: Family Medicine

## 2013-04-18 DIAGNOSIS — I82409 Acute embolism and thrombosis of unspecified deep veins of unspecified lower extremity: Secondary | ICD-10-CM

## 2013-04-18 DIAGNOSIS — Z7901 Long term (current) use of anticoagulants: Secondary | ICD-10-CM

## 2013-04-18 LAB — PROTIME-INR: INR: 1.8 — ABNORMAL HIGH (ref <1.50)

## 2013-04-18 NOTE — Progress Notes (Signed)
Lab visit only. 

## 2013-06-19 ENCOUNTER — Encounter: Payer: Self-pay | Admitting: Family Medicine

## 2013-06-19 ENCOUNTER — Ambulatory Visit (INDEPENDENT_AMBULATORY_CARE_PROVIDER_SITE_OTHER): Payer: Medicare Other | Admitting: Family Medicine

## 2013-06-19 VITALS — BP 120/71 | HR 68 | Temp 97.8°F | Resp 16 | Ht 73.0 in | Wt 216.0 lb

## 2013-06-19 DIAGNOSIS — M5137 Other intervertebral disc degeneration, lumbosacral region: Secondary | ICD-10-CM

## 2013-06-19 DIAGNOSIS — Z23 Encounter for immunization: Secondary | ICD-10-CM

## 2013-06-19 DIAGNOSIS — I10 Essential (primary) hypertension: Secondary | ICD-10-CM

## 2013-06-19 DIAGNOSIS — M5136 Other intervertebral disc degeneration, lumbar region: Secondary | ICD-10-CM | POA: Insufficient documentation

## 2013-06-19 DIAGNOSIS — M51379 Other intervertebral disc degeneration, lumbosacral region without mention of lumbar back pain or lower extremity pain: Secondary | ICD-10-CM

## 2013-06-19 DIAGNOSIS — Z760 Encounter for issue of repeat prescription: Secondary | ICD-10-CM

## 2013-06-19 MED ORDER — ALLOPURINOL 300 MG PO TABS: 300.0000 mg | ORAL_TABLET | Freq: Every day | ORAL | Status: DC

## 2013-06-19 MED ORDER — FENOFIBRATE 145 MG PO TABS: 145.0000 mg | ORAL_TABLET | Freq: Every day | ORAL | Status: DC

## 2013-06-20 NOTE — Progress Notes (Signed)
S: This 72 y.o. Cauc male has HTN and is here for f/u and to provide update re: medical events since last visit. Pt had lumbar laminectomy in July 2014 performed by Dr. Julio Sicks. He is proud to report significant improvement of R foot drop since surgery; PT is ongoing and pt is diligent w/ exercises. He has resumes walking and feels good in general. He continues to take Sertraline for chronic anxiety. Pt has a good appetite, sleeps well and exhibits not unusual behaviors, thoughts or ideations.  HTN- compliance w/ medication w/o side effects. No report of diaphoresis, vision disturbances, CP or tightness, palpitations, edema, SOB or DOE , cough, HA, dizziness, numbness or weakness.   Patient Active Problem List   Diagnosis Date Noted  . DDD (degenerative disc disease), lumbar 06/19/2013  . Long term (current) use of anticoagulants 04/13/2013  . History of bladder cancer 12/12/2012  . Gout 06/22/2012  . Pulmonary emboli 11/03/2011  . DVT (deep venous thrombosis) 11/03/2011  . Hypertension 11/03/2011  . Arthritis 11/03/2011  . Anxiety 11/03/2011   PMHx, Soc Hx and Medications reconciled.  ROS: As per HPI; otherwise noncontributory.  O: Filed Vitals:   06/19/13 0912  BP: 120/71  Pulse: 68  Temp: 97.8 F (36.6 C)  Resp: 16   GEN: In NAD; WN,WD. HENT: [place]/AT; EOMI w/ clear conj/sclerae; otherwise unremarkable. COR: RRR. LUNGS: Normal resp rate and effort. SKIN: W&D; intact w/o erythema, rashes or pallor. NEURO: A&O x3 ; CNs intact. Motor- lower ext R dorsiflexion 3+-4/5; good ROM in R ankle. Gait near normal.  A/P: Hypertension- Stable and well controlled on current medication.  DDD (degenerative disc disease), lumbar- Improving; continue PT and f/u with neurosurgeon as scheduled.  Issue of repeat prescriptions  Need for prophylactic vaccination and inoculation against influenza - Plan: Flu Vaccine QUAD 36+ mos IM  Meds ordered this encounter  Medications  . allopurinol  (ZYLOPRIM) 300 MG tablet    Sig: Take 1 tablet (300 mg total) by mouth daily.    Dispense:  90 tablet    Refill:  3  . fenofibrate (TRICOR) 145 MG tablet    Sig: Take 1 tablet (145 mg total) by mouth daily.    Dispense:  30 tablet    Refill:  5

## 2013-10-13 ENCOUNTER — Ambulatory Visit (INDEPENDENT_AMBULATORY_CARE_PROVIDER_SITE_OTHER): Payer: Medicare Other | Admitting: Family Medicine

## 2013-10-13 VITALS — BP 116/72 | HR 78 | Temp 97.8°F | Resp 18 | Ht 73.0 in | Wt 222.0 lb

## 2013-10-13 DIAGNOSIS — H109 Unspecified conjunctivitis: Secondary | ICD-10-CM

## 2013-10-13 MED ORDER — TOBRAMYCIN 0.3 % OP SOLN: 1.0000 [drp] | Freq: Four times a day (QID) | OPHTHALMIC | Status: DC

## 2013-10-13 NOTE — Progress Notes (Signed)
Patient ID: Paul Luna MRN: [id number], DOB: [birth year], 73 y.o. Date of Encounter: 10/13/2013, 10:03 AM  Primary Physician: Kennon Portela, MD  Chief Complaint: Conjunctivitis  HPI: 73 y.o. year old male with history below presents with sx of bilateral conjunctivitis, onset 1 week ago. Pt reports associated crust and drainage in the mornings. States initial onset began in the left upper eyelid and spread. Pt had back surgery about 8 months ago and reports mild foot drop since then. Pt states x-ray and initial consultation done for back injury was done by Dr. Joseph Art. Pt is from [place] and states he has not been back in the last 5-6 years. Pt states he has not eaten meat in 25-30 years; states he will have chicken from time to time. States daughter and son started out as vegetarians and he soon followed.    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 Authorizing Provider  allopurinol (ZYLOPRIM) 300 MG tablet Take 1 tablet (300 mg total) by mouth daily. 06/19/13 06/19/14 Yes Barton Fanny, MD  ascorbic acid (VITAMIN C) 500 MG tablet Take 500 mg by mouth daily.   Yes Historical Provider, MD  Cholecalciferol (VITAMIN D-3 PO) Take 1 tablet by mouth daily.   Yes Historical Provider, MD  fenofibrate (TRICOR) 145 MG tablet Take 1 tablet (145 mg total) by mouth daily. 06/19/13  Yes Barton Fanny, MD  KRILL OIL PO Take 1 capsule by mouth daily.   Yes Historical Provider, MD  losartan-hydrochlorothiazide (HYZAAR) 50-12.5 MG per tablet Take 1 tablet by mouth daily. 12/12/12  Yes Barton Fanny, MD  sertraline (ZOLOFT) 100 MG tablet Take 1 tablet (100 mg total) by mouth daily. 12/12/12  Yes Barton Fanny, MD  Warfarin Sodium (COUMADIN PO) Take 4 mg by mouth daily.    Yes Historical  Provider, MD    Allergies:  Allergies  Allergen Reactions   Iodine     childhood   Shellfish Allergy Nausea And Vomiting    Gastrointestinal    History   Social History   Marital Status: Married    Spouse Name: N/A    Number of Children: N/A   Years of Education: N/A   Occupational History   Not on file.   Social History Main Topics   Smoking status: Former Smoker    Quit date: 10/03/1989   Smokeless tobacco: Never Used   Alcohol Use: Yes     Comment: 1 glass a week   Drug Use: No   Sexual Activity: Yes    Partners: Male    Museum/gallery curator: None   Other Topics Concern   Not on file   Social History Narrative   No narrative on file     Review of Systems: Constitutional: negative for chills, fever, night sweats, weight changes, or fatigue  HEENT: negative for vision changes, hearing loss, congestion, rhinorrhea, ST, epistaxis, or sinus pressure Eyes: Bilateral conjunctivitis  Cardiovascular: negative for chest pain or palpitations Respiratory: negative for hemoptysis, wheezing, shortness of breath, or cough Abdominal: negative for abdominal pain, nausea, vomiting, diarrhea, or constipation Dermatological: negative for rash Neurologic: negative for headache, dizziness, or syncope All other systems reviewed and are otherwise negative with the exception to those  above and in the HPI.   Physical Exam: Blood pressure 116/72, pulse 78, temperature 97.8 F (36.6 C), temperature source Oral, resp. rate 18, height 6\' 1"  (1.854 m), weight 222 lb (100.699 kg), SpO2 97.00%., Body mass index is 29.3 kg/(m^2). General: Well developed, well nourished, in no acute distress. Head: Normocephalic, atraumatic, eyes without discharge, sclera non-icteric, nares are without discharge. Bilateral auditory canals clear, TM's are without perforation, pearly grey and translucent with reflective cone of light bilaterally. Oral cavity moist, posterior pharynx without  exudate, erythema, peritonsillar abscess, or post nasal drip. Eyes:  Injected eyes bilaterally, greater on the left, with exudates along the eyelash borders of both eyes. Fundi normal Neck: Supple. No thyromegaly. Full ROM. No lymphadenopathy. Lungs: Clear bilaterally to auscultation without wheezes, rales, or rhonchi. Breathing is unlabored. Heart: RRR with S1 S2. No murmurs, rubs, or gallops appreciated. Abdomen: Soft, non-tender, non-distended with normoactive bowel sounds. No hepatomegaly. No rebound/guarding. No obvious abdominal masses. Msk:  Strength and tone normal for age. Extremities/Skin: Warm and dry. No clubbing or cyanosis. No edema. No rashes or suspicious lesions. Neuro: Alert and oriented X 3. Moves all extremities spontaneously. Gait is normal. CNII-XII grossly in tact. Psych:  Responds to questions appropriately with a normal affect.   Labs:   ASSESSMENT AND PLAN:  73 y.o. year old male with Conjunctivitis - Plan: tobramycin (TOBREX) 0.3 % ophthalmic solution     Signed, Robyn Haber, MD 10/13/2013 10:03 AM       HPI Review of Systems Physical Exam

## 2013-10-13 NOTE — Patient Instructions (Signed)

## 2013-11-24 ENCOUNTER — Ambulatory Visit: Payer: Medicare Other

## 2013-11-24 ENCOUNTER — Ambulatory Visit (INDEPENDENT_AMBULATORY_CARE_PROVIDER_SITE_OTHER): Payer: Medicare Other | Admitting: Emergency Medicine

## 2013-11-24 VITALS — BP 130/80 | HR 56 | Temp 97.7°F | Resp 16 | Ht 72.5 in | Wt 220.0 lb

## 2013-11-24 DIAGNOSIS — R071 Chest pain on breathing: Secondary | ICD-10-CM

## 2013-11-24 DIAGNOSIS — R0789 Other chest pain: Secondary | ICD-10-CM

## 2013-11-24 MED ORDER — HYDROCODONE-ACETAMINOPHEN 5-325 MG PO TABS: 1.0000 | ORAL_TABLET | ORAL | Status: DC | PRN

## 2013-11-24 NOTE — Progress Notes (Signed)
[facility] [address], [place] [address] [phone]  Date:  11/24/2013   Name:  Evaan Tidwell   DOB:  December 11, 1940   MRN:  [id number]  PCP:  Kennon Portela, MD    Chief Complaint: Chest Injury   History of Present Illness:  Tavares Levinson is a 73 y.o. very pleasant male patient who presents with the following:  [place] in the yard a week ago and injured his left upper chest.  Since has experienced a sharp pain in the infraclavicular fossa that is increasing and associated with movement, coughing, deep breathing and sneezing.  No shortness of breath, hemoptysis or wheezing. No nausea or vomiting.  Denies other injury from the fall.  No improvement with over the counter medications or other home remedies. Denies other complaint or health concern today.   Patient Active Problem List   Diagnosis Date Noted  . DDD (degenerative disc disease), lumbar 06/19/2013  . Long term (current) use of anticoagulants 04/13/2013  . History of bladder cancer 12/12/2012  . Gout 06/22/2012  . Pulmonary emboli 11/03/2011  . DVT (deep venous thrombosis) 11/03/2011  . Hypertension 11/03/2011  . Arthritis 11/03/2011  . Anxiety 11/03/2011    Past Medical History  Diagnosis Date  . Hypertension   . Arthritis   . 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 Anxiety     chronic  . Clotting disorder     Past Surgical History  Procedure Laterality Date  . Cancer bladder  1991    managed by urologist  . Total hip arthroplasty      right  . Joint replacement    . Colonoscopy w/ biopsies and polypectomy      Hx: of  . Lumbar laminectomy/decompression microdiscectomy Right 04/08/2013    Procedure: LUMBAR FOUR-FIVE LUMBAR LAMINECTOMY/DECOMPRESSION MICRODISCECTOMY 1 LEVEL;  Surgeon: Charlie Pitter, MD;  Location: [facility];  Service: Neurosurgery;  Laterality: Right;    History  Substance Use Topics  . Smoking status: Former Smoker    Quit date: 10/03/1989  . Smokeless tobacco: Never Used   . Alcohol Use: Yes     Comment: 1 glass a week    Family History  Problem Relation Age of Onset  . Hypertension Mother   . Heart disease Mother 48  . Hypertension Father   . Arthritis Father   . Stroke Father 8  . Cancer Brother   . Heart disease Brother 27    Allergies  Allergen Reactions  . Iodine     childhood  . Shellfish Allergy Nausea And Vomiting    Gastrointestinal    Medication list has been reviewed and updated.  Current Outpatient Prescriptions on File Prior to Visit  Medication Sig Dispense Refill  . ascorbic acid (VITAMIN C) 500 MG tablet Take 500 mg by mouth daily.      . Cholecalciferol (VITAMIN D-3 PO) Take 1 tablet by mouth daily.      . fenofibrate (TRICOR) 145 MG tablet Take 1 tablet (145 mg total) by mouth daily.  30 tablet  5  . KRILL OIL PO Take 1 capsule by mouth daily.      Marland Kitchen losartan-hydrochlorothiazide (HYZAAR) 50-12.5 MG per tablet Take 1 tablet by mouth daily.  90 tablet  3  . sertraline (ZOLOFT) 100 MG tablet Take 1 tablet (100 mg total) by mouth daily.  90 tablet  3  . Warfarin Sodium (COUMADIN PO) Take 4 mg by mouth daily.       Marland Kitchen  allopurinol (ZYLOPRIM) 300 MG tablet Take 1 tablet (300 mg total) by mouth daily.  90 tablet  3   No current facility-administered medications on file prior to visit.    Review of Systems:  As per HPI, otherwise negative.    Physical Examination: Filed Vitals:   11/24/13 1307  BP: 130/80  Pulse: 56  Temp: 97.7 F (36.5 C)  Resp: 16   Filed Vitals:   11/24/13 1307  Height: 6' 0.5" (1.842 m)  Weight: 220 lb (99.791 kg)   Body mass index is 29.41 kg/(m^2). Ideal Body Weight: Weight in (lb) to have BMI = 25: 186.5  GEN: WDWN, NAD, Non-toxic, A & O x 3 HEENT: Atraumatic, Normocephalic. Neck supple. No masses, No LAD. Ears and Nose: No external deformity. CV: RRR, No M/G/R. No JVD. No thrill. No extra heart sounds. PULM: CTA B, no wheezes, crackles, rhonchi. No retractions. No resp. distress. No  accessory muscle use. ABD: S, NT, ND, +BS. No rebound. No HSM. EXTR: No c/c/e NEURO Normal gait.  PSYCH: Normally interactive. Conversant. Not depressed or anxious appearing.  Calm demeanor.  CHEST:  Splints left upper.  Marked tenderness.  No ecchymosis or flail or crepitus  Assessment and Plan: Chest wall contusion possible rib fracture  vicodin   Signed,  Ellison Carwin, MD   [facility] reading (PRIMARY) by  Dr. Ouida Sills negative chest.

## 2013-11-24 NOTE — Patient Instructions (Signed)
Rib Fracture  A rib fracture is a break or crack in one of the bones of the ribs. The ribs are a group of long, curved bones that wrap around your chest and attach to your spine. They protect your lungs and other organs in the chest cavity. A broken or cracked rib is often painful, but most do not cause other problems. Most rib fractures heal on their own over time. However, rib fractures can be more serious if multiple ribs are broken or if broken ribs move out of place and push against other structures.  CAUSES   · A direct blow to the chest. For example, this could happen during contact sports, a car accident, or a fall against a hard object.  · Repetitive movements with high force, such as pitching a baseball or having severe coughing spells.  SYMPTOMS   · Pain when you breathe in or cough.  · Pain when someone presses on the injured area.  DIAGNOSIS   Your caregiver will perform a physical exam. Various imaging tests may be ordered to confirm the diagnosis and to look for related injuries. These tests may include a chest X-ray, computed tomography (CT), magnetic resonance imaging (MRI), or a bone scan.  TREATMENT   Rib fractures usually heal on their own in 1 3 months. The longer healing period is often associated with a continued cough or other aggravating activities. During the healing period, pain control is very important. Medication is usually given to control pain. Hospitalization or surgery may be needed for more severe injuries, such as those in which multiple ribs are broken or the ribs have moved out of place.   HOME CARE INSTRUCTIONS   · Avoid strenuous activity and any activities or movements that cause pain. Be careful during activities and avoid bumping the injured rib.  · Gradually increase activity as directed by your caregiver.  · Only take over-the-counter or prescription medications as directed by your caregiver. Do not take other medications without asking your caregiver first.  · Apply ice  to the injured area for the first 1 2 days after you have been treated or as directed by your caregiver. Applying ice helps to reduce inflammation and pain.  · Put ice in a plastic bag.  · Place a towel between your skin and the bag.    · Leave the ice on for 15 20 minutes at a time, every 2 hours while you are awake.  · Perform deep breathing as directed by your caregiver. This will help prevent pneumonia, which is a common complication of a broken rib. Your caregiver may instruct you to:  · Take deep breaths several times a day.  · Try to cough several times a day, holding a pillow against the injured area.  · Use a device called an incentive spirometer to practice deep breathing several times a day.  · Drink enough fluids to keep your urine clear or pale yellow. This will help you avoid constipation.    · Do not wear a rib belt or binder. These restrict breathing, which can lead to pneumonia.    SEEK IMMEDIATE MEDICAL CARE IF:   · You have a fever.    · You have difficulty breathing or shortness of breath.    · You develop a continual cough, or you cough up thick or bloody sputum.  · You feel sick to your stomach (nausea), throw up (vomit), or have abdominal pain.    · You have worsening pain not controlled with medications.      MAKE SURE YOU:  · Understand these instructions.  · Will watch your condition.  · Will get help right away if you are not doing well or get worse.  Document Released: 09/19/2005 Document Revised: 05/22/2013 Document Reviewed: 11/21/2012  [employer]® Patient Information ©2014 [employer].

## 2013-12-07 ENCOUNTER — Telehealth: Payer: Self-pay

## 2013-12-07 NOTE — Telephone Encounter (Signed)
PATIENT HAS APPT WITH DR MCPHERSON IN 2 WEEKS BUT BEFORE THEN NEEDS REFILLS ON HIS LOSARTAN AND SERTRALINE IF POSSIBLE TO THE [facility] [place]

## 2013-12-09 ENCOUNTER — Other Ambulatory Visit: Payer: Self-pay | Admitting: Family Medicine

## 2013-12-09 MED ORDER — SERTRALINE HCL 100 MG PO TABS: 100.0000 mg | ORAL_TABLET | Freq: Every day | ORAL | Status: DC

## 2013-12-09 MED ORDER — LOSARTAN POTASSIUM-HCTZ 50-12.5 MG PO TABS: 1.0000 | ORAL_TABLET | Freq: Every day | ORAL | Status: DC

## 2013-12-09 NOTE — Telephone Encounter (Signed)
Sent in RFs and notified pt. 

## 2013-12-18 ENCOUNTER — Ambulatory Visit: Payer: Medicare Other | Admitting: Family Medicine

## 2013-12-20 ENCOUNTER — Ambulatory Visit (INDEPENDENT_AMBULATORY_CARE_PROVIDER_SITE_OTHER): Payer: Medicare Other | Admitting: Family Medicine

## 2013-12-20 ENCOUNTER — Encounter: Payer: Self-pay | Admitting: Family Medicine

## 2013-12-20 VITALS — BP 137/80 | HR 76 | Temp 97.7°F | Resp 16 | Ht 73.0 in | Wt 219.0 lb

## 2013-12-20 DIAGNOSIS — I4949 Other premature depolarization: Secondary | ICD-10-CM

## 2013-12-20 DIAGNOSIS — I10 Essential (primary) hypertension: Secondary | ICD-10-CM

## 2013-12-20 DIAGNOSIS — I493 Ventricular premature depolarization: Secondary | ICD-10-CM

## 2013-12-20 DIAGNOSIS — Z0000 Encounter for general adult medical examination without abnormal findings: Secondary | ICD-10-CM

## 2013-12-20 DIAGNOSIS — E785 Hyperlipidemia, unspecified: Secondary | ICD-10-CM

## 2013-12-20 DIAGNOSIS — Z139 Encounter for screening, unspecified: Secondary | ICD-10-CM

## 2013-12-20 LAB — LIPID PANEL
Cholesterol: 236 mg/dL — ABNORMAL HIGH (ref 0–200)
HDL: 35 mg/dL — ABNORMAL LOW (ref >39)
LDL CALC: 146 mg/dL — AB (ref 0–99)
Total CHOL/HDL Ratio: 6.7 Ratio
Triglycerides: 277 mg/dL — ABNORMAL HIGH (ref <150)
VLDL: 55 mg/dL — AB (ref 0–40)

## 2013-12-20 LAB — COMPREHENSIVE METABOLIC PANEL
ALT: 16 U/L (ref 0–53)
AST: 15 U/L (ref 0–37)
Albumin: 4.2 g/dL (ref 3.5–5.2)
Alkaline Phosphatase: 56 U/L (ref 39–117)
BILIRUBIN TOTAL: 0.5 mg/dL (ref 0.2–1.2)
BUN: 25 mg/dL — AB (ref 6–23)
CHLORIDE: 104 meq/L (ref 96–112)
CO2: 28 mEq/L (ref 19–32)
CREATININE: 1.21 mg/dL (ref 0.50–1.35)
Calcium: 9.5 mg/dL (ref 8.4–10.5)
Glucose, Bld: 95 mg/dL (ref 70–99)
Potassium: 4.2 mEq/L (ref 3.5–5.3)
Sodium: 140 mEq/L (ref 135–145)
Total Protein: 6.8 g/dL (ref 6.0–8.3)

## 2013-12-20 LAB — IFOBT (OCCULT BLOOD): IFOBT: POSITIVE

## 2013-12-20 MED ORDER — FENOFIBRATE 145 MG PO TABS: 145.0000 mg | ORAL_TABLET | Freq: Every day | ORAL | Status: DC

## 2013-12-20 MED ORDER — HYDROCORTISONE ACETATE 25 MG RE SUPP: 25.0000 mg | Freq: Two times a day (BID) | RECTAL | Status: DC | PRN

## 2013-12-20 MED ORDER — LOSARTAN POTASSIUM-HCTZ 50-12.5 MG PO TABS: 1.0000 | ORAL_TABLET | Freq: Every day | ORAL | Status: DC

## 2013-12-20 MED ORDER — SERTRALINE HCL 100 MG PO TABS: 100.0000 mg | ORAL_TABLET | Freq: Every day | ORAL | Status: DC

## 2013-12-20 NOTE — Patient Instructions (Addendum)
Premature Ventricular Contraction Premature ventricular contraction (PVC) is an irregularity of the heart rhythm involving extra or skipped heartbeats. In some cases, they may occur without obvious cause or heart disease. Other times, they can be caused by an electrolyte change in the blood. These need to be corrected. They can also be seen when there is not enough oxygen going to the heart. A common cause of this is plaque or cholesterol buildup. This buildup decreases the blood supply to the heart. In addition, extra beats may be caused or aggravated by:  Excessive smoking.  Alcohol consumption.  Caffeine.  Certain medications  Some street drugs. SYMPTOMS   The sensation of feeling your heart skipping a beat (palpitations).  In many cases, the person may have no symptoms. SIGNS AND TESTS   A physical examination may show an occasional irregularity, but if the PVC beats do not happen often, they may not be found on physical exam.  Blood pressure is usually normal.  Other tests that may find extra beats of the heart are:  An EKG (electrocardiogram)  A Holter monitor which can monitor your heart over longer periods of time  An Angiogram (study of the heart arteries). TREATMENT  Usually extra heartbeats do not need treatment. The condition is treated only if symptoms are severe or if extra beats are very frequent or are causing problems. An underlying cause, if discovered, may also require treatment.  Treatment may also be needed if there may be a risk for other more serious cardiac arrhythmias.  PREVENTION   Moderation in caffeine, alcohol, and tobacco use may reduce the risk of ectopic heartbeats in some people.  Exercise often helps people who lead a sedentary (inactive) lifestyle. PROGNOSIS  PVC heartbeats are generally harmless and do not need treatment.  RISKS AND COMPLICATIONS   Ventricular tachycardia (occasionally).  There usually are no complications.  Other  arrhythmias (occasionally). SEEK IMMEDIATE MEDICAL CARE IF:   You feel palpitations that are frequent or continual.  You develop chest pain or other problems such as shortness of breath, sweating, or nausea and vomiting.  You become light-headed or faint (pass out).  You get worse or do not improve with treatment. Document Released: 05/06/2004 Document Revised: 12/12/2011 Document Reviewed: 11/16/2007 [facility] Patient Information 2014 [place].   I have ordered a referral to Dr. Daneen Schick for full cardiac evaluation; continue current medications. Return if symptoms occur or you feel "unwell".  A member of our staff will contact you about your appointment within the nexr week or so.

## 2013-12-20 NOTE — Progress Notes (Signed)
Subjective:    Patient ID: Paul Luna, male    DOB: [birth year], 73 y.o.   MRN: [id number]  HPI  This 73 y.o. 73 male is here for [facility] annual CPE; he feels well. [place]. He had lumbar spine surgery last summer and is fully recovered. He is compliant w/ medications w/o adverse effects.  HCM: CRS- Current (negative; has internal hemorrhoids).           IMM- CUrrent.           PSA- Sees Urology for follow-up bladder cancer.           Vision- Annually or biannually.           Dental- Routine.  Patient Active Problem List   Diagnosis Date Noted  . Other and unspecified hyperlipidemia 12/20/2013  . DDD (degenerative disc disease), lumbar 06/19/2013  . Long term (current) use of anticoagulants 04/13/2013  . History of bladder cancer 12/12/2012  . Gout 06/22/2012  . Pulmonary emboli 11/03/2011  . DVT (deep venous thrombosis) 11/03/2011  . Hypertension 11/03/2011  . Arthritis 11/03/2011  . Anxiety 11/03/2011    Prior to Admission medications   Medication Sig Start Date End Date Taking? Authorizing Provider  allopurinol (ZYLOPRIM) 300 MG tablet Take 1 tablet (300 mg total) by mouth daily. 06/19/13  Yes Barton Fanny, MD  ascorbic acid (VITAMIN C) 500 MG tablet Take 500 mg by mouth daily.   Yes Historical Provider, MD  Cholecalciferol (VITAMIN D-3 PO) Take 1 tablet by mouth daily.   Yes Historical Provider, MD  fenofibrate (TRICOR) 145 MG tablet Take 1 tablet (145 mg total) by mouth daily.   Yes Barton Fanny, MD  HYDROcodone-acetaminophen (NORCO) 5-325 MG per tablet Take 1-2 tablets by mouth every 4 (four) hours as needed for moderate pain. 11/24/13  Yes Ellison Carwin, MD  KRILL OIL PO Take 1 capsule by mouth daily.   Yes Historical Provider, MD  losartan-hydrochlorothiazide (HYZAAR) 50-12.5 MG per tablet Take 1 tablet by mouth daily.   Yes Barton Fanny, MD  sertraline (ZOLOFT) 100 MG tablet Take 1 tablet  (100 mg total) by mouth daily.   Yes Barton Fanny, MD  Warfarin Sodium (COUMADIN PO) Take 4 mg by mouth daily.    Yes Historical Provider, MD  hydrocortisone (ANUSOL-HC) 25 MG suppository Place 1 suppository (25 mg total) rectally 2 (two) times daily as needed for hemorrhoids or itching.    Barton Fanny, MD   PMHx, Surg Hx, Soc and Fam Hx reviewed.  HOME SAFETY Assessment: No hazards identified.   Review of Systems  Constitutional: Positive for activity change.       Exercising 3 days a week w/ wife at free program at the [employer].  Gastrointestinal: Positive for anal bleeding.       Known hemorrhoids.  Psychiatric/Behavioral:       Chronic anxiety and depression well controlled on current medication.      Objective:   Physical Exam  Nursing note and vitals reviewed. Constitutional: He is oriented to person, place, and time. Vital signs are normal. He appears well-developed and well-nourished. No distress.  HENT:  Head: Normocephalic and atraumatic.  Right Ear: Hearing, external ear and ear canal normal.  Left Ear: Hearing, external ear and ear canal normal.  Eyes: Conjunctivae, EOM and lids are normal. Pupils are equal, round, and reactive to light. No scleral icterus.  Fundoscopic exam:  The right eye shows no papilledema. The right eye shows red reflex.       The left eye shows no papilledema. The left eye shows red reflex.  Wears corrective lenses.  Neck: Trachea normal, normal range of motion and full passive range of motion without pain. Neck supple. No JVD present. No spinous process tenderness and no muscular tenderness present. Carotid bruit is not present. No mass and no thyromegaly present.  Cardiovascular: Normal rate, regular rhythm, S1 normal, S2 normal, normal heart sounds and normal pulses.   Occasional extrasystoles are present. PMI is not displaced.  Exam reveals no gallop and no friction rub.   No murmur heard. Pulmonary/Chest: Effort normal  and breath sounds normal. No respiratory distress.  Abdominal: Soft. Normal appearance and bowel sounds are normal. He exhibits no distension, no abdominal bruit, no pulsatile midline mass and no mass. There is no hepatosplenomegaly. There is no tenderness. There is no guarding and no CVA tenderness. No hernia.  Genitourinary: Prostate normal. Rectal exam shows mass, tenderness and anal tone abnormal. Rectal exam shows no fissure. Guaiac positive stool.  Musculoskeletal: Normal range of motion. He exhibits no edema and no tenderness.       Cervical back: Normal.       Thoracic back: Normal.       Lumbar back: Normal.       Back:  Lymphadenopathy:       Head (right side): No submental, no submandibular, no tonsillar, no posterior auricular and no occipital adenopathy present.       Head (left side): No submental, no submandibular, no tonsillar, no posterior auricular and no occipital adenopathy present.    He has no cervical adenopathy.       Right: No inguinal and no supraclavicular adenopathy present.       Left: No inguinal and no supraclavicular adenopathy present.  Neurological: He is alert and oriented to person, place, and time. He has normal strength and normal reflexes. He displays no atrophy and no tremor. No cranial nerve deficit or sensory deficit. He exhibits normal muscle tone. Coordination and gait normal.  Skin: Skin is warm, dry and intact. No bruising, no lesion and no rash noted. He is not diaphoretic. No cyanosis or erythema. No pallor. Nails show no clubbing.  Psychiatric: He has a normal mood and affect. His speech is normal and behavior is normal. Judgment and thought content normal. Cognition and memory are normal.    Results for orders placed in visit on 12/20/13  IFOBT (OCCULT BLOOD)      Result Value Ref Range   IFOBT Positive     ECG: NSR w/ unifocal PVC.     Assessment & Plan:  Routine general medical examination at a health care facility - Plan: EKG 12-Lead,  IFOBT POC (occult bld, rslt in office), Hepatitis C antibody, Lipid panel, Comprehensive metabolic panel  Other and unspecified hyperlipidemia - Plan: Lipid panel, Comprehensive metabolic panel  Hypertension - Stable and well controlled on current medication. Plan: Comprehensive metabolic panel  Asymptomatic PVCs - Plan: Ambulatory referral to Cardiology  Meds ordered this encounter  Medications  . fenofibrate (TRICOR) 145 MG tablet    Sig: Take 1 tablet (145 mg total) by mouth daily.    Dispense:  30 tablet    Refill:  11  . losartan-hydrochlorothiazide (HYZAAR) 50-12.5 MG per tablet    Sig: Take 1 tablet by mouth daily.    Dispense:  30 tablet    Refill:  11  .  sertraline (ZOLOFT) 100 MG tablet    Sig: Take 1 tablet (100 mg total) by mouth daily.    Dispense:  30 tablet    Refill:  11  . hydrocortisone (ANUSOL-HC) 25 MG suppository    Sig: Place 1 suppository (25 mg total) rectally 2 (two) times daily as needed for hemorrhoids or itching.    Dispense:  12 suppository    Refill:  1

## 2013-12-21 LAB — HEPATITIS C ANTIBODY: HCV Ab: NEGATIVE

## 2013-12-22 ENCOUNTER — Encounter: Payer: Self-pay | Admitting: Family Medicine

## 2013-12-23 ENCOUNTER — Encounter: Payer: Self-pay | Admitting: Family Medicine

## 2014-01-08 ENCOUNTER — Other Ambulatory Visit: Payer: Self-pay | Admitting: Family Medicine

## 2014-01-22 ENCOUNTER — Institutional Professional Consult (permissible substitution): Payer: Medicare Other | Admitting: Interventional Cardiology

## 2014-02-13 ENCOUNTER — Encounter: Payer: Self-pay | Admitting: Cardiology

## 2014-02-13 ENCOUNTER — Ambulatory Visit (INDEPENDENT_AMBULATORY_CARE_PROVIDER_SITE_OTHER): Payer: Medicare Other | Admitting: Cardiology

## 2014-02-13 VITALS — BP 122/58 | HR 72 | Ht 73.0 in | Wt 219.0 lb

## 2014-02-13 DIAGNOSIS — I491 Atrial premature depolarization: Secondary | ICD-10-CM | POA: Insufficient documentation

## 2014-02-13 NOTE — Patient Instructions (Signed)
Your physician recommends that you continue on your current medications as directed. Please refer to the Current Medication list given to you today.   Your physician recommends that you schedule a follow-up appointment as needed  

## 2014-02-13 NOTE — Progress Notes (Signed)
[place], [place], [place]  [address] Phone: [phone] Fax:  [phone]  Date:  02/13/2014   ID:  Paul Luna, DOB 15-May-1941, MRN [id number]  PCP:  Kennon Portela, MD  Cardiologist:  Fransico Him, MD     History of Present Illness: This is a 73yo male with a history of HTN who presents today for evaluation of "PVC's" that were noted on recent EKG on routine PE.  He is completely asymptomatic and does not feel the PVC's.  He denies any chest pain, SOB, DOE, PND, orthopnea, dizziness or syncope.  Occasionally he has some RLE edema which is from prior DVT.  He spends an hour at the [facility] days weekly and then walks his dog for an hour.   Wt Readings from Last 3 Encounters:  02/13/14 219 lb (99.338 kg)  12/20/13 219 lb (99.338 kg)  11/24/13 220 lb (99.791 kg)     Past Medical History  Diagnosis Date  . Hypertension   . Arthritis   . 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 Anxiety     chronic  . Clotting disorder     Current Outpatient Prescriptions  Medication Sig Dispense Refill  . allopurinol (ZYLOPRIM) 300 MG tablet Take 1 tablet (300 mg total) by mouth daily.  90 tablet  3  . ascorbic acid (VITAMIN C) 500 MG tablet Take 500 mg by mouth daily.      . Cholecalciferol (VITAMIN D-3 PO) Take 1 tablet by mouth daily.      . fenofibrate (TRICOR) 145 MG tablet Take 1 tablet (145 mg total) by mouth daily.  30 tablet  11  . KRILL OIL PO Take 1 capsule by mouth daily.      Marland Kitchen losartan-hydrochlorothiazide (HYZAAR) 50-12.5 MG per tablet Take 1 tablet by mouth daily.  30 tablet  11  . sertraline (ZOLOFT) 100 MG tablet Take 1 tablet (100 mg total) by mouth daily.  30 tablet  11  . Warfarin Sodium (COUMADIN PO) Take 4 mg by mouth daily.        No current facility-administered medications for this visit.    Allergies:    Allergies  Allergen Reactions  . Iodine     childhood  . Shellfish Allergy Nausea And Vomiting    Gastrointestinal    Social History:  The patient   reports that he quit smoking about 24 years ago. He has never used smokeless tobacco. He reports that he drinks alcohol. He reports that he does not use illicit drugs.   Family History:  The patient's family history includes Arthritis in his father; Cancer in his brother; Heart disease (age of onset: 57) in his brother; Heart disease (age of onset: 49) in his mother; Hypertension in his father and mother; Stroke (age of onset: 56) in his father.   ROS:  Please see the history of present illness.      All other systems reviewed and negative.   PHYSICAL EXAM: VS:  BP 122/58  Pulse 72  Ht 6\' 1"  (1.854 m)  Wt 219 lb (99.338 kg)  BMI 28.90 kg/m2 Well nourished, well developed, in no acute distress HEENT: normal Neck: no JVD Cardiac:  normal S1, S2; RRR; no murmur Lungs:  clear to auscultation bilaterally, no wheezing, rhonchi or rales Abd: soft, nontender, no hepatomegaly Ext: no edema Skin: warm and dry Neuro:  CNs 2-12 intact, no focal abnormalities noted  EKG:  NSR with Pac's     ASSESSMENT  AND PLAN:  1. ? PVC's - EKG was reviewed by me and there is no evidence of PVC's.  The extra heart beats are PAC's and are definitely from the atria.  The QRS is narrow complex.  Some occur in a trigeminal pattern.  These are completely benign which I explained to the patient.  He is completely asymptomatic and no further workup or treatment needed.   Followup with me PRN  Signed, Fransico Him, MD 02/13/2014 11:38 AM

## 2014-04-25 ENCOUNTER — Ambulatory Visit: Payer: Medicare Other | Admitting: Family Medicine

## 2014-05-09 ENCOUNTER — Encounter: Payer: Self-pay | Admitting: Family Medicine

## 2014-05-09 ENCOUNTER — Ambulatory Visit (INDEPENDENT_AMBULATORY_CARE_PROVIDER_SITE_OTHER): Payer: Medicare Other | Admitting: Family Medicine

## 2014-05-09 VITALS — BP 124/70 | HR 79 | Temp 97.6°F | Resp 16 | Ht 73.0 in | Wt 215.4 lb

## 2014-05-09 DIAGNOSIS — E785 Hyperlipidemia, unspecified: Secondary | ICD-10-CM

## 2014-05-09 DIAGNOSIS — I10 Essential (primary) hypertension: Secondary | ICD-10-CM

## 2014-05-10 LAB — LIPID PANEL
CHOL/HDL RATIO: 5.7 ratio
Cholesterol: 247 mg/dL — ABNORMAL HIGH (ref 0–200)
HDL: 43 mg/dL (ref >39)
LDL Cholesterol: 150 mg/dL — ABNORMAL HIGH (ref 0–99)
Triglycerides: 272 mg/dL — ABNORMAL HIGH (ref <150)
VLDL: 54 mg/dL — AB (ref 0–40)

## 2014-05-10 LAB — BASIC METABOLIC PANEL
BUN: 21 mg/dL (ref 6–23)
CO2: 26 mEq/L (ref 19–32)
Calcium: 9.4 mg/dL (ref 8.4–10.5)
Chloride: 104 mEq/L (ref 96–112)
Creat: 1.25 mg/dL (ref 0.50–1.35)
Glucose, Bld: 101 mg/dL — ABNORMAL HIGH (ref 70–99)
Potassium: 4.3 mEq/L (ref 3.5–5.3)
SODIUM: 142 meq/L (ref 135–145)

## 2014-05-10 LAB — ALT: ALT: 17 U/L (ref 0–53)

## 2014-05-10 NOTE — Progress Notes (Signed)
S:  This 73 y.o. Cauc male has HTN, well controlled on current medication w/o adverse effects. He is compliant w/ all medications, including Fenofibrate and OTC krill oil for lipid disorder. He exercises several days per week w/o difficulty and maintains a healthy diet. He is knowledgeable about Mediterranean Diet. Pt arrived earlier to day for fasting lab draw.  Medication RFs are needed.  Patient Active Problem List   Diagnosis Date Noted  . PAC (premature atrial contraction) 02/13/2014  . Other and unspecified hyperlipidemia 12/20/2013  . DDD (degenerative disc disease), lumbar 06/19/2013  . Long term (current) use of anticoagulants 04/13/2013  . History of bladder cancer 12/12/2012  . Gout 06/22/2012  . Pulmonary emboli 11/03/2011  . DVT (deep venous thrombosis) 11/03/2011  . Hypertension 11/03/2011  . Arthritis 11/03/2011  . Anxiety 11/03/2011    Prior to Admission medications   Medication Sig Start Date End Date Taking? Authorizing Provider  allopurinol (ZYLOPRIM) 300 MG tablet Take 1 tablet (300 mg total) by mouth daily. 06/19/13 06/19/14 Yes Barton Fanny, MD  ascorbic acid (VITAMIN C) 500 MG tablet Take 500 mg by mouth daily.   Yes Historical Provider, MD  Cholecalciferol (VITAMIN D-3 PO) Take 1 tablet by mouth daily.   Yes Historical Provider, MD  fenofibrate (TRICOR) 145 MG tablet Take 1 tablet (145 mg total) by mouth daily. 12/20/13  Yes Barton Fanny, MD  KRILL OIL PO Take 1 capsule by mouth daily.   Yes Historical Provider, MD  losartan-hydrochlorothiazide (HYZAAR) 50-12.5 MG per tablet Take 1 tablet by mouth daily. 12/20/13  Yes Barton Fanny, MD  sertraline (ZOLOFT) 100 MG tablet Take 1 tablet (100 mg total) by mouth daily. 12/20/13  Yes Barton Fanny, MD  Warfarin Sodium (COUMADIN PO) Take 4 mg by mouth daily.    Yes Historical Provider, MD    Allergies  Allergen Reactions  . Iodine     childhood  . Shellfish Allergy Nausea And Vomiting   Gastrointestinal    Past Surgical History  Procedure Laterality Date  . Cancer bladder  1991    managed by urologist  . Total hip arthroplasty      right  . Joint replacement    . Colonoscopy w/ biopsies and polypectomy      Hx: of  . Lumbar laminectomy/decompression microdiscectomy Right 04/08/2013    Procedure: LUMBAR FOUR-FIVE LUMBAR LAMINECTOMY/DECOMPRESSION MICRODISCECTOMY 1 LEVEL;  Surgeon: Charlie Pitter, MD;  Location: [facility];  Service: Neurosurgery;  Laterality: Right;    Family History  Problem Relation Age of Onset  . Hypertension Mother   . Heart disease Mother 39  . Hypertension Father   . Arthritis Father   . Stroke Father 46  . Cancer Brother   . Heart disease Brother 71    History   Social History  . Marital Status: Married    Spouse Name: N/A    Number of Children: N/A  . Years of Education: N/A   Occupational History  . retired    Social History Main Topics  . Smoking status: Former Smoker    Quit date: 10/03/1989  . Smokeless tobacco: Never Used  . Alcohol Use: Yes     Comment: 1 glass a week  . Drug Use: No  . Sexual Activity: Yes    Partners: Male    Birth Control/ Protection: None   Other Topics Concern  . Not on file   Social History Narrative   From [place], [place]  ROS: Negative for abnormal weight change, fatigue, diaphoresis, vision changes, CP or tightness, palpitations, edema, SOB or DOE, cough, rashes, myalgias, HA, dizziness, numbness, weakness or syncope.   O: Filed Vitals:   05/09/14 1454  BP: 124/70  Pulse: 79  Temp: 97.6 F (36.4 C)  Resp: 16   GEN: In NAD; WN,WD. Weight down 7 lbs tis year. HENT: [place]/AT. EOMI w/ clear conj/sclerae. Otherwise unremarkable. COR: RRR. LUNGS: Unlabored resp. SKIN: W&D; intact w/o erythema or diaphoresis. MS: MAEs;  no deformities or atrophy. NEURO: A&O x 3; CNs intact. Nonfocal.  A/P: Essential hypertension - Stable and well controlled on current medication. Plan: Basic  metabolic panel  Other and unspecified hyperlipidemia - Continue Fenofibrate and krill oil.  Plan: Lipid panel, ALT

## 2014-05-13 ENCOUNTER — Other Ambulatory Visit: Payer: Self-pay | Admitting: Family Medicine

## 2014-05-13 MED ORDER — EZETIMIBE-SIMVASTATIN 10-20 MG PO TABS: 1.0000 | ORAL_TABLET | Freq: Every day | ORAL | Status: DC

## 2014-05-17 ENCOUNTER — Other Ambulatory Visit: Payer: Self-pay | Admitting: Family Medicine

## 2014-05-19 NOTE — Telephone Encounter (Signed)
Dr Leward Quan, you just saw pt and stated pt was well-controlled on HTN meds, but don't see Gout discussed. Do you want to give RFs?

## 2014-05-23 ENCOUNTER — Other Ambulatory Visit: Payer: Self-pay | Admitting: Radiology

## 2014-05-23 MED ORDER — EZETIMIBE-SIMVASTATIN 10-20 MG PO TABS: 1.0000 | ORAL_TABLET | Freq: Every day | ORAL | Status: DC

## 2014-05-24 ENCOUNTER — Other Ambulatory Visit: Payer: Self-pay | Admitting: Family Medicine

## 2014-05-24 NOTE — Telephone Encounter (Signed)
We did briefly discuss gout; he stated he would have pharmacy contact me when he needed med refill.

## 2014-07-01 ENCOUNTER — Other Ambulatory Visit: Payer: Self-pay | Admitting: Family Medicine

## 2014-07-12 ENCOUNTER — Encounter: Payer: Self-pay | Admitting: Family Medicine

## 2014-07-12 ENCOUNTER — Other Ambulatory Visit: Payer: Self-pay | Admitting: Family Medicine

## 2014-07-12 MED ORDER — ALLOPURINOL 300 MG PO TABS: ORAL_TABLET | ORAL | Status: DC

## 2014-09-11 ENCOUNTER — Telehealth: Payer: Self-pay | Admitting: *Deleted

## 2014-09-11 NOTE — Telephone Encounter (Signed)
Spoke with patient's wife regarding the flu shot and they both had injection done at [facility].

## 2014-09-23 ENCOUNTER — Encounter: Payer: Self-pay | Admitting: Internal Medicine

## 2014-09-23 ENCOUNTER — Encounter: Payer: Self-pay | Admitting: Family Medicine

## 2014-10-01 ENCOUNTER — Telehealth: Payer: Self-pay | Admitting: Family Medicine

## 2014-10-01 DIAGNOSIS — E785 Hyperlipidemia, unspecified: Secondary | ICD-10-CM

## 2014-10-01 NOTE — Telephone Encounter (Signed)
Vytorin will become more expensive in Jan 2016; spoke with pt's insurer who submitted a PA for this drug. Decision in 48-72 hours. I spoke w/ pt who agrees to try a different medication (statin) w/ fenofibrate, if PA denied; he has never been on a statin prior to Aug 2015. He is taking fenofibrate and Vytorin w/o adverse effects. He will come in for fasting CMET and lipid panel in mid-Jan 2016.

## 2014-10-02 NOTE — Telephone Encounter (Signed)
Dr Leward Quan, [facility]. Also, pt wanted me to give you a hug from him when I next see you.

## 2014-10-02 NOTE — Telephone Encounter (Signed)
PA was approved for a tier cost sharing exception at tier 3 co-pay through 10/03/15. Notified pt.

## 2014-10-05 ENCOUNTER — Encounter: Payer: Self-pay | Admitting: Family Medicine

## 2014-10-16 ENCOUNTER — Other Ambulatory Visit: Payer: Self-pay | Admitting: Family Medicine

## 2014-11-03 ENCOUNTER — Other Ambulatory Visit (INDEPENDENT_AMBULATORY_CARE_PROVIDER_SITE_OTHER): Payer: Medicare Other | Admitting: Family Medicine

## 2014-11-03 DIAGNOSIS — I10 Essential (primary) hypertension: Secondary | ICD-10-CM

## 2014-11-03 DIAGNOSIS — E785 Hyperlipidemia, unspecified: Secondary | ICD-10-CM

## 2014-11-03 LAB — COMPLETE METABOLIC PANEL WITH GFR
ALT: 17 U/L (ref 0–53)
AST: 19 U/L (ref 0–37)
Albumin: 4.5 g/dL (ref 3.5–5.2)
Alkaline Phosphatase: 53 U/L (ref 39–117)
BUN: 28 mg/dL — ABNORMAL HIGH (ref 6–23)
CHLORIDE: 104 meq/L (ref 96–112)
CO2: 24 mEq/L (ref 19–32)
Calcium: 9.6 mg/dL (ref 8.4–10.5)
Creat: 1.24 mg/dL (ref 0.50–1.35)
GFR, Est African American: 67 mL/min
GFR, Est Non African American: 58 mL/min — ABNORMAL LOW
Glucose, Bld: 99 mg/dL (ref 70–99)
POTASSIUM: 4.3 meq/L (ref 3.5–5.3)
SODIUM: 139 meq/L (ref 135–145)
TOTAL PROTEIN: 7 g/dL (ref 6.0–8.3)
Total Bilirubin: 0.5 mg/dL (ref 0.2–1.2)

## 2014-11-03 LAB — LIPID PANEL
CHOL/HDL RATIO: 4.2 ratio
CHOLESTEROL: 173 mg/dL (ref 0–200)
HDL: 41 mg/dL (ref >39)
LDL CALC: 90 mg/dL (ref 0–99)
Triglycerides: 208 mg/dL — ABNORMAL HIGH (ref <150)
VLDL: 42 mg/dL — ABNORMAL HIGH (ref 0–40)

## 2014-11-11 ENCOUNTER — Ambulatory Visit: Payer: Medicare Other | Admitting: Family Medicine

## 2014-11-12 ENCOUNTER — Encounter: Payer: Self-pay | Admitting: Family Medicine

## 2014-11-12 ENCOUNTER — Ambulatory Visit (INDEPENDENT_AMBULATORY_CARE_PROVIDER_SITE_OTHER): Payer: Medicare Other | Admitting: Family Medicine

## 2014-11-12 VITALS — BP 120/58 | HR 88 | Temp 97.9°F | Resp 16 | Ht 73.0 in | Wt 220.4 lb

## 2014-11-12 DIAGNOSIS — Z23 Encounter for immunization: Secondary | ICD-10-CM

## 2014-11-12 DIAGNOSIS — I10 Essential (primary) hypertension: Secondary | ICD-10-CM

## 2014-11-12 DIAGNOSIS — E785 Hyperlipidemia, unspecified: Secondary | ICD-10-CM

## 2014-11-12 MED ORDER — ALLOPURINOL 300 MG PO TABS: ORAL_TABLET | ORAL | Status: DC

## 2014-11-12 MED ORDER — ZOSTER VACCINE LIVE 19400 UNT/0.65ML ~~LOC~~ SOLR: 0.6500 mL | Freq: Once | SUBCUTANEOUS | Status: DC

## 2014-11-12 MED ORDER — SERTRALINE HCL 100 MG PO TABS: 100.0000 mg | ORAL_TABLET | Freq: Every day | ORAL | Status: DC

## 2014-11-12 MED ORDER — EZETIMIBE-SIMVASTATIN 10-20 MG PO TABS: 1.0000 | ORAL_TABLET | Freq: Every day | ORAL | Status: DC

## 2014-11-12 MED ORDER — FENOFIBRATE 145 MG PO TABS: 145.0000 mg | ORAL_TABLET | Freq: Every day | ORAL | Status: DC

## 2014-11-12 MED ORDER — LOSARTAN POTASSIUM-HCTZ 50-12.5 MG PO TABS: 1.0000 | ORAL_TABLET | Freq: Every day | ORAL | Status: DC

## 2014-11-12 NOTE — Progress Notes (Signed)
S:  This 74 y.o. Male has HTN and hyperlipidemia, most recent labs showed marked improvement compared to 6 months ago. Pt is compliant w. Medications w/o adverse effects. He requests all medications for 90-day refills, cycling at the same time. He is having no fatigue, diaphoresis, vision disturbances, CP or tightness, palpitations, SOB or DOE, cough, GI problems, HA, dizziness, weakness or syncope.  Patient Active Problem List   Diagnosis Date Noted  . PAC (premature atrial contraction) 02/13/2014  . Other and unspecified hyperlipidemia 12/20/2013  . DDD (degenerative disc disease), lumbar 06/19/2013  . Long term (current) use of anticoagulants 04/13/2013  . History of bladder cancer 12/12/2012  . Gout 06/22/2012  . Pulmonary emboli 11/03/2011  . DVT (deep venous thrombosis) 11/03/2011  . Hypertension 11/03/2011  . Arthritis 11/03/2011  . Anxiety 11/03/2011    Prior to Admission medications   Medication Sig Start Date End Date Taking? Authorizing Provider  allopurinol (ZYLOPRIM) 300 MG tablet TAKE ONE TABLET BY MOUTH DAILY   Yes Barton Fanny, MD  ascorbic acid (VITAMIN C) 500 MG tablet Take 500 mg by mouth daily.   Yes Historical Provider, MD  ezetimibe-simvastatin (VYTORIN) 10-20 MG per tablet Take 1 tablet by mouth daily.   Yes Barton Fanny, MD  fenofibrate (TRICOR) 145 MG tablet Take 1 tablet (145 mg total) by mouth daily.   Yes Barton Fanny, MD  KRILL OIL PO Take 1 capsule by mouth daily.   Yes Historical Provider, MD  losartan-hydrochlorothiazide (HYZAAR) 50-12.5 MG per tablet Take 1 tablet by mouth daily.   Yes Barton Fanny, MD  sertraline (ZOLOFT) 100 MG tablet Take 1 tablet (100 mg total) by mouth daily.   Yes Barton Fanny, MD  Warfarin Sodium (COUMADIN PO) Take 4 mg by mouth daily.    Yes Historical Provider, MD  Cholecalciferol (VITAMIN D-3 PO) Take 1 tablet by mouth daily.    Historical Provider, MD    Past Surgical History  Procedure  Laterality Date  . Cancer bladder  1991    managed by urologist  . Total hip arthroplasty      right  . Joint replacement    . Colonoscopy w/ biopsies and polypectomy      Hx: of  . Lumbar laminectomy/decompression microdiscectomy Right 04/08/2013    Procedure: LUMBAR FOUR-FIVE LUMBAR LAMINECTOMY/DECOMPRESSION MICRODISCECTOMY 1 LEVEL;  Surgeon: Charlie Pitter, MD;  Location: [facility];  Service: Neurosurgery;  Laterality: Right;    SOC and FAM Hx reviewed.  ROS: As per HPI.  O: Filed Vitals:   11/12/14 0937  BP: 120/58  Pulse: 88  Temp: 97.9 F (36.6 C)  Resp: 16   GEN: In NAD; WN,WD. HENT: [place]/AT. EOMI w/ clear conj/sclerae. Otherwise unremarkable. COR: RRR. LUNGS: Unlabored resp. SKIN; W&D; intact w/o diaphoresis or pallor. MS: MAEs; no deformities or atrophy. NEURO: A&O x 3; CNs intact. Nonfocal.  A/P: Essential hypertension- Stable and well controlled. No medication change.  Hyperlipidemia with target LDL less than 100- Stable on current statin.  Need for shingles vaccine - Plan: zoster vaccine live, PF, (ZOSTAVAX) 09326 UNT/0.65ML injection  Meds ordered this encounter  Medications  . sertraline (ZOLOFT) 100 MG tablet    Sig: Take 1 tablet (100 mg total) by mouth daily.    Dispense:  90 tablet    Refill:  3  . losartan-hydrochlorothiazide (HYZAAR) 50-12.5 MG per tablet    Sig: Take 1 tablet by mouth daily.    Dispense:  90 tablet  Refill:  3  . ezetimibe-simvastatin (VYTORIN) 10-20 MG per tablet    Sig: Take 1 tablet by mouth daily.    Dispense:  90 tablet    Refill:  3  . allopurinol (ZYLOPRIM) 300 MG tablet    Sig: TAKE ONE TABLET BY MOUTH DAILY    Dispense:  90 tablet    Refill:  3  . fenofibrate (TRICOR) 145 MG tablet    Sig: Take 1 tablet (145 mg total) by mouth daily.    Dispense:  90 tablet    Refill:  3  . zoster vaccine live, PF, (ZOSTAVAX) 94801 UNT/0.65ML injection    Sig: Inject 19,400 Units into the skin once.    Dispense:  1 each     Refill:  0

## 2014-11-26 ENCOUNTER — Other Ambulatory Visit: Payer: Self-pay | Admitting: Family Medicine

## 2015-01-07 ENCOUNTER — Ambulatory Visit (INDEPENDENT_AMBULATORY_CARE_PROVIDER_SITE_OTHER): Payer: Medicare Other | Admitting: Family Medicine

## 2015-01-07 ENCOUNTER — Encounter: Payer: Self-pay | Admitting: Family Medicine

## 2015-01-07 VITALS — BP 120/60 | HR 79 | Temp 97.6°F | Resp 16 | Ht 72.5 in | Wt 217.2 lb

## 2015-01-07 DIAGNOSIS — Z0000 Encounter for general adult medical examination without abnormal findings: Secondary | ICD-10-CM | POA: Diagnosis not present

## 2015-01-07 DIAGNOSIS — Z1211 Encounter for screening for malignant neoplasm of colon: Secondary | ICD-10-CM

## 2015-01-07 DIAGNOSIS — I10 Essential (primary) hypertension: Secondary | ICD-10-CM | POA: Diagnosis not present

## 2015-01-07 MED ORDER — LOSARTAN POTASSIUM 50 MG PO TABS: 50.0000 mg | ORAL_TABLET | Freq: Every day | ORAL | Status: DC

## 2015-01-07 NOTE — Progress Notes (Signed)
Subjective:    Patient ID: Paul Luna, male    DOB: August 18, 1941, 74 y.o.   MRN: [id number]  HPI  This 74 y.o. Male is here for [facility] Subsequent CPE. His specialty care team includes a urologist, ophthalmologist and he followed [facility] for chronic anti-coagulation. Immunizations are current as is CRS.  Patient Active Problem List   Diagnosis Date Noted  . PAC (premature atrial contraction) 02/13/2014  . Other and unspecified hyperlipidemia 12/20/2013  . DDD (degenerative disc disease), lumbar 06/19/2013  . Long term (current) use of anticoagulants 04/13/2013  . History of bladder cancer 12/12/2012  . Gout 06/22/2012  . Pulmonary emboli 11/03/2011  . DVT (deep venous thrombosis) 11/03/2011  . Hypertension 11/03/2011  . Arthritis 11/03/2011  . Anxiety 11/03/2011    Prior to Admission medications   Medication Sig Start Date End Date Taking? Authorizing Provider  allopurinol (ZYLOPRIM) 300 MG tablet TAKE ONE TABLET BY MOUTH DAILY 11/12/14  Yes Barton Fanny, MD  ascorbic acid (VITAMIN C) 500 MG tablet Take 500 mg by mouth daily.   Yes Historical Provider, MD  Cholecalciferol (VITAMIN D-3 PO) Take 1 tablet by mouth daily.   Yes Historical Provider, MD  ezetimibe-simvastatin (VYTORIN) 10-20 MG per tablet Take 1 tablet by mouth daily. 11/12/14  Yes Barton Fanny, MD  fenofibrate (TRICOR) 145 MG tablet Take 1 tablet (145 mg total) by mouth daily. 11/12/14  Yes Barton Fanny, MD  KRILL OIL PO Take 1 capsule by mouth daily.   Yes Historical Provider, MD  losartan-hydrochlorothiazide (HYZAAR) 50-12.5 MG per tablet Take 1 tablet by mouth daily. 11/12/14  Yes Barton Fanny, MD  sertraline (ZOLOFT) 100 MG tablet Take 1 tablet (100 mg total) by mouth daily. 11/12/14  Yes Barton Fanny, MD  Warfarin Sodium (COUMADIN PO) Take 4 mg by mouth daily.    Yes Historical Provider, MD  zoster vaccine live, PF, (ZOSTAVAX) 16010 UNT/0.65ML injection Inject 19,400 Units into  the skin once. 11/12/14   Barton Fanny, MD    Past Surgical History  Procedure Laterality Date  . Cancer bladder  1991    managed by urologist  . Total hip arthroplasty      right  . Joint replacement    . Colonoscopy w/ biopsies and polypectomy      Hx: of  . Lumbar laminectomy/decompression microdiscectomy Right 04/08/2013    Procedure: LUMBAR FOUR-FIVE LUMBAR LAMINECTOMY/DECOMPRESSION MICRODISCECTOMY 1 LEVEL;  Surgeon: Charlie Pitter, MD;  Location: [facility];  Service: Neurosurgery;  Laterality: Right;    History   Social History  . Marital Status: Married    Spouse Name: N/A  . Number of Children: N/A  . Years of Education: N/A   Occupational History  . retired    Social History Main Topics  . Smoking status: Former Smoker    Quit date: 10/03/1989  . Smokeless tobacco: Never Used  . Alcohol Use: Yes     Comment: 1 glass a week  . Drug Use: No  . Sexual Activity:    Partners: Male    Birth Control/ Protection: None   Other Topics Concern  . Not on file   Social History Narrative   From [place], [place]    Family History  Problem Relation Age of Onset  . Hypertension Mother   . Heart disease Mother 46  . Hypertension Father   . Arthritis Father   . Stroke Father 56  . Cancer Brother   . Heart  disease Brother 44    Review of Systems  Constitutional: Negative.   HENT: Positive for hearing loss.        Wears hearing aid in R ear.  Eyes: Negative.   Respiratory: Negative.   Cardiovascular: Negative.   Gastrointestinal: Negative.   Endocrine: Negative.   Genitourinary: Positive for frequency.       Followed by urologist.  Musculoskeletal: Positive for arthralgias.       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  Skin: Negative.   Allergic/Immunologic: Negative.   Neurological: Positive for light-headedness. Negative for dizziness, syncope, speech difficulty, weakness, numbness and headaches.       Occasional lightheadedness when standing up too  fast from stooped position.  Hematological: Negative.   Psychiatric/Behavioral: Negative.        Objective:   Physical Exam  Constitutional: He is oriented to person, place, and time. Vital signs are normal. He appears well-developed and well-nourished. No distress.  Blood pressure 120/60, pulse 79, temperature 97.6 F (36.4 C), temperature source Oral, resp. rate 16, height 6' 0.5" (1.842 m), weight 217 lb 3.2 oz (98.521 kg), SpO2 97 %.   HENT:  Head: Normocephalic and atraumatic.  Right Ear: Tympanic membrane, external ear and ear canal normal. Decreased hearing is noted.  Left Ear: Hearing, tympanic membrane, external ear and ear canal normal.  Nose: Nose normal. No mucosal edema, nasal deformity or septal deviation.  Mouth/Throat: Uvula is midline, oropharynx is clear and moist and mucous membranes are normal. No oral lesions. Normal dentition. No uvula swelling.  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  Eyes: Conjunctivae, EOM and lids are normal. Pupils are equal, round, and reactive to light. No scleral icterus.  Wears corrective lenses.  Neck: Trachea normal and phonation normal. Neck supple. No JVD present. No spinous process tenderness and no muscular tenderness present. Carotid bruit is not present. Decreased range of motion present. No thyroid mass and no thyromegaly present.  Cardiovascular: Normal rate, regular rhythm, S1 normal, S2 normal, normal heart sounds, intact distal pulses and normal pulses.   No extrasystoles are present. PMI is not displaced.  Exam reveals no gallop and no friction rub.   No murmur heard. Pulmonary/Chest: Effort normal and breath sounds normal. No respiratory distress.  Abdominal: Soft. Normal appearance and bowel sounds are normal. He exhibits no distension, no abdominal bruit, no pulsatile midline mass and no mass. There is no hepatosplenomegaly. There is no tenderness. There is no guarding and no CVA tenderness.  Genitourinary:  Deferred.  Musculoskeletal:         Cervical back: He exhibits decreased range of motion. He exhibits no tenderness, no deformity and no spasm.       Thoracic back: Normal.       Lumbar back: Normal.  Well healed midline scar. Remainder of exam unremarkable.  Lymphadenopathy:       Head (right side): No submental, no submandibular, no tonsillar, no preauricular, no posterior auricular and no occipital adenopathy present.       Head (left side): No submental, no submandibular, no tonsillar, no preauricular, no posterior auricular and no occipital adenopathy present.    He has no cervical adenopathy.    He has no axillary adenopathy.       Right: No inguinal and no supraclavicular adenopathy present.       Left: No inguinal and no supraclavicular adenopathy present.  Neurological: He is alert and oriented to person, place, and time. He has normal strength. He displays no  atrophy. No cranial nerve deficit or sensory deficit. He exhibits normal muscle tone. He displays a negative Romberg sign. Coordination and gait normal. He displays no Babinski's sign on the right side. He displays no Babinski's sign on the left side.  Reflex Scores:      Tricep reflexes are 1+ on the right side and 1+ on the left side.      Bicep reflexes are 2+ on the right side and 2+ on the left side.      Brachioradialis reflexes are 1+ on the right side and 1+ on the left side.      Patellar reflexes are 1+ on the right side and 1+ on the left side. GET UP AND GO test: time= 6 seconds.  Skin: Skin is warm, dry and intact. No ecchymosis, no lesion and no rash noted. He is not diaphoretic. No cyanosis or erythema. No pallor. Nails show no clubbing.  Psychiatric: He has a normal mood and affect. His speech is normal and behavior is normal. Judgment and thought content normal. Cognition and memory are normal.  Nursing note and vitals reviewed.      Assessment & Plan:  Medicare annual wellness visit, subsequent  Essential hypertension- Reduce dose of  BP medication- change medication to Cozaar 50 mg 1 tab daily; check BP at home. Follow-up w/ specialists as scheduled.  Encounter for screening fecal occult blood testing - Plan: IFOBT POC (occult bld, rslt in office)  Meds ordered this encounter  Medications  . losartan (COZAAR) 50 MG tablet    Sig: Take 1 tablet (50 mg total) by mouth daily.    Dispense:  90 tablet    Refill:  3

## 2015-01-07 NOTE — Patient Instructions (Addendum)
Keeping you healthy  Get these tests  Blood pressure- Have your blood pressure checked once a year by your healthcare provider.  Normal blood pressure is 120/80  Weight- Have your body mass index (BMI) calculated to screen for obesity.  BMI is a measure of body fat based on height and weight. You can also calculate your own BMI at ViewBanking.si.  Cholesterol- Have your cholesterol checked every year.  Diabetes- Have your blood sugar checked regularly if you have high blood pressure, high cholesterol, have a family history of diabetes or if you are overweight.  Screening for Colon Cancer- Colonoscopy starting at age 37.  Screening may begin sooner depending on your family history and other health conditions. Follow up colonoscopy as directed by your Gastroenterologist.  Screening for Prostate Cancer- Both blood work (PSA) and a rectal exam help screen for Prostate Cancer.  Screening begins at age 46 with African-American men and at age 92 with Caucasian men.  Screening may begin sooner depending on your family history.  Take these medicines  Aspirin- One aspirin daily can help prevent Heart disease and Stroke.  Flu shot- Every fall.  Tetanus- Every 10 years.  Zostavax- Once after the age of 38 to prevent Shingles.  Pneumonia shot- Once after the age of 57; if you are younger than 73, ask your healthcare provider if you need a Pneumonia shot.  Take these steps  Don't smoke- If you do smoke, talk to your doctor about quitting.  For tips on how to quit, go to www.smokefree.gov or call 1-800-QUIT-NOW.  Be physically active- Exercise 5 days a week for at least 30 minutes.  If you are not already physically active start slow and gradually work up to 30 minutes of moderate physical activity.  Examples of moderate activity include walking briskly, mowing the yard, dancing, swimming, bicycling, etc.  Eat a healthy diet- Eat a variety of healthy food such as fruits, vegetables, low  fat milk, low fat cheese, yogurt, lean meant, poultry, fish, beans, tofu, etc. For more information go to www.thenutritionsource.org  Drink alcohol in moderation- Limit alcohol intake to less than two drinks a day. Never drink and drive.  Dentist- Brush and floss twice daily; visit your dentist twice a year.  Depression- Your emotional health is as important as your physical health. If you're feeling down, or losing interest in things you would normally enjoy please talk to your healthcare provider.  Eye exam- Visit your eye doctor every year.  Safe sex- If you may be exposed to a sexually transmitted infection, use a condom.  Seat belts- Seat belts can save your life; always wear one.  Smoke/Carbon Monoxide detectors- These detectors need to be installed on the appropriate level of your home.  Replace batteries at least once a year.  Skin cancer- When out in the sun, cover up and use sunscreen 15 SPF or higher.  Violence- If anyone is threatening you, please tell your healthcare provider.  Living Will/ Health care power of attorney- Speak with your healthcare provider and family.   I has been my great pleasure and privilege to have an opportunity to be your physician. I hope to see you around [place]; [place] city so it is possible.  Take care and be blessed.  If you need anything before March 04, 2015, let me know.

## 2015-07-02 ENCOUNTER — Encounter: Payer: Self-pay | Admitting: Family Medicine

## 2015-07-02 ENCOUNTER — Ambulatory Visit (INDEPENDENT_AMBULATORY_CARE_PROVIDER_SITE_OTHER): Payer: Medicare Other | Admitting: Family Medicine

## 2015-07-02 VITALS — BP 112/62 | HR 78 | Temp 98.0°F | Resp 16 | Wt 212.2 lb

## 2015-07-02 DIAGNOSIS — M109 Gout, unspecified: Secondary | ICD-10-CM

## 2015-07-02 DIAGNOSIS — I10 Essential (primary) hypertension: Secondary | ICD-10-CM

## 2015-07-02 DIAGNOSIS — E785 Hyperlipidemia, unspecified: Secondary | ICD-10-CM | POA: Diagnosis not present

## 2015-07-02 DIAGNOSIS — Z7901 Long term (current) use of anticoagulants: Secondary | ICD-10-CM

## 2015-07-02 DIAGNOSIS — Z79899 Other long term (current) drug therapy: Secondary | ICD-10-CM | POA: Diagnosis not present

## 2015-07-02 NOTE — Patient Instructions (Signed)
Keep up the great work - Your cholesterol was looking SO much better when it was checked last February and your blood pressure is doing better on LESS medicine! No changes - have your pharmacy send refill request whenever needed.

## 2015-07-02 NOTE — Progress Notes (Signed)
Subjective:    Patient ID: Paul Luna, male    DOB: Mar 16, 1941, 74 y.o.   MRN: [id number] Chief Complaint  Patient presents with  . Hypertension    HPI   This 74 y.o. Male has HTN and hyperlipidemia here for his 6 mo follow-up on his chronic medical problems.    Anticoag: Seen every 6 wks at [place] [facility]. The rx his warfarin. Level has been stable between 2-3 for yaers w/o dose change.  HTN: Has BP checked every 6 wks at [facility] and  BP has been well controlled.  hctz was stopped   TLC: Walks dogs for an hour every day. Has made some diet changes - taking krill oil, taking d, c, for a while which had made his lipid panel look sig better.  Pt's wife Shakeel Disney is also a pt of mine.  Meds: No med concerns or side effects. No compliance problems. He requests all medications for 90-day refills, cycling at the same time.  HM: Will make his AWV 6 mos from now.  He had a colonosocpy 5 yrs ago - he does not remember where but same location as his wife (who reports Dr. Laurence Spates at [place])  GU: Is followed at [facility] - the check his PSA.  Past Medical History  Diagnosis Date  . Hypertension   . Arthritis   . 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 Anxiety     chronic  . Clotting disorder    Current Outpatient Prescriptions on File Prior to Visit  Medication Sig Dispense Refill  . ascorbic acid (VITAMIN C) 500 MG tablet Take 500 mg by mouth daily.    . Cholecalciferol (VITAMIN D-3 PO) Take 1 tablet by mouth daily.    Marland Kitchen ezetimibe-simvastatin (VYTORIN) 10-20 MG per tablet Take 1 tablet by mouth daily. 90 tablet 3  . fenofibrate (TRICOR) 145 MG tablet Take 1 tablet (145 mg total) by mouth daily. 90 tablet 3  . KRILL OIL PO Take 1 capsule by mouth daily.    Marland Kitchen losartan (COZAAR) 50 MG tablet Take 1 tablet (50 mg total) by mouth daily. 90 tablet 3  . sertraline (ZOLOFT) 100 MG tablet Take 1 tablet (100 mg total) by mouth daily. 90 tablet 3  . Warfarin Sodium (COUMADIN  PO) Take 4 mg by mouth daily.      No current facility-administered medications on file prior to visit.   Allergies  Allergen Reactions  . Iodine     childhood  . Shellfish Allergy Nausea And Vomiting    Gastrointestinal     Review of Systems  Constitutional: Negative for fever and chills.  Eyes: Negative for visual disturbance.  Respiratory: Negative for shortness of breath.   Cardiovascular: Negative for chest pain and leg swelling.  Neurological: Negative for dizziness, syncope, facial asymmetry, weakness, light-headedness and headaches.       Objective:  BP 112/62 mmHg  Pulse 78  Temp(Src) 98 F (36.7 C) (Oral)  Resp 16  Wt 212 lb 3.2 oz (96.253 kg)  Physical Exam  Constitutional: He is oriented to person, place, and time. He appears well-developed and well-nourished. No distress.  HENT:  Head: Normocephalic and atraumatic.  Eyes: Conjunctivae are normal. Pupils are equal, round, and reactive to light. No scleral icterus.  Neck: Normal range of motion. Neck supple. No thyromegaly present.  Cardiovascular: Normal rate, regular rhythm, normal heart sounds and intact distal pulses.   Pulmonary/Chest: Effort normal and breath sounds  normal. No respiratory distress.  Musculoskeletal: He exhibits no edema.  Lymphadenopathy:    He has no cervical adenopathy.  Neurological: He is alert and oriented to person, place, and time.  Skin: Skin is warm and dry. He is not diaphoretic.  Psychiatric: He has a normal mood and affect. His behavior is normal.          Assessment & Plan:    1. Essential hypertension - Stable and well controlled. No medication change.  2. Hyperlipidemia LDL goal <100 - Stable on current statin.  3. Long term current use of anticoagulant therapy   4. Gout, unspecified cause, unspecified chronicity, unspecified site   5. Encounter for medication management    Reminded to get zostavax at pharmacy.  F/u in 6 mos for AWV. Ok to refill any  medications x 6 mos whenever we receive pharmacy request.  HPL and HTN have both been doing great on less medicine so keep up the tlc - fasting labs in 6 mos.  Delman Cheadle, MD MPH

## 2015-07-09 ENCOUNTER — Other Ambulatory Visit: Payer: Self-pay

## 2015-07-09 MED ORDER — ALLOPURINOL 300 MG PO TABS: ORAL_TABLET | ORAL | Status: DC

## 2015-07-10 ENCOUNTER — Ambulatory Visit: Payer: Medicare Other | Admitting: Family Medicine

## 2015-09-21 ENCOUNTER — Other Ambulatory Visit: Payer: Self-pay

## 2015-09-21 MED ORDER — SERTRALINE HCL 100 MG PO TABS
100.0000 mg | ORAL_TABLET | Freq: Every day | ORAL | Status: DC
Start: 2015-09-21 — End: 2015-12-31

## 2015-10-04 DIAGNOSIS — D689 Coagulation defect, unspecified: Secondary | ICD-10-CM

## 2015-10-04 HISTORY — DX: Coagulation defect, unspecified: D68.9

## 2015-12-31 ENCOUNTER — Other Ambulatory Visit: Payer: Self-pay | Admitting: Family Medicine

## 2016-01-07 ENCOUNTER — Encounter: Payer: Medicare Other | Admitting: Family Medicine

## 2019-10-04 HISTORY — PX: COLONOSCOPY: SHX174

## 2020-03-03 ENCOUNTER — Encounter: Payer: Self-pay | Admitting: Physician Assistant

## 2020-03-31 ENCOUNTER — Telehealth: Payer: Self-pay | Admitting: *Deleted

## 2020-03-31 NOTE — Telephone Encounter (Signed)
===  View-only below this line=== ----- Message ----- From: Alfredia Ferguson, PA-C Sent: 03/31/2020   8:44 AM EDT To: Oda Kilts, CMA Subject: RE: Wednesdays schedule                        Shalana Jardin- not sure why he would have scheduled appt - would you mind calling pt to see why appt was scheduled since already seeing GI in [place] , and getting infusions - thanks ----- Message ----- From: Oda Kilts, CMA Sent: 03/30/2020   9:38 AM EDT To: Alfredia Ferguson, PA-C Subject: Wednesdays schedule                            Will you take a look at his, Im with you on Wednesday and noticed this patient is already seeing gastro in [place]   and receiving his infusions already Is this someone I should remove from your schedule?? Thanks Shirlean Mylar    They moved to this area the infusions were being done in [place]   He wants to be established here at our office because he moved He needs Entyvio set up with [place]

## 2020-03-31 NOTE — Telephone Encounter (Signed)
Ok - will need his records

## 2020-04-01 NOTE — Telephone Encounter (Signed)
Hes in Care Everywhere  I printed some stuff  Colon report said colon every 5 years May have to sign to get his colon

## 2020-04-02 ENCOUNTER — Ambulatory Visit (INDEPENDENT_AMBULATORY_CARE_PROVIDER_SITE_OTHER): Payer: Medicare Other | Admitting: Physician Assistant

## 2020-04-02 ENCOUNTER — Telehealth: Payer: Self-pay | Admitting: *Deleted

## 2020-04-02 ENCOUNTER — Encounter: Payer: Self-pay | Admitting: Physician Assistant

## 2020-04-02 VITALS — BP 144/80 | HR 80 | Ht 72.25 in | Wt 205.5 lb

## 2020-04-02 DIAGNOSIS — K5100 Ulcerative (chronic) pancolitis without complications: Secondary | ICD-10-CM

## 2020-04-02 DIAGNOSIS — K635 Polyp of colon: Secondary | ICD-10-CM

## 2020-04-02 MED ORDER — SUPREP BOWEL PREP KIT 17.5-3.13-1.6 GM/177ML PO SOLN: 1.0000 | Freq: Once | ORAL | 0 refills | Status: AC

## 2020-04-02 NOTE — Telephone Encounter (Signed)
Will have to contact office and fax letter    04/02/2020   RE: Ryun Velez DOB: [birth year] MRN: [id number]   Dear Dr Valora Piccolo,    We have scheduled the above patient for an endoscopic procedure. Our records show that he is on anticoagulation therapy.   Please advise as to how long the patient may come off his therapy of Coumadin prior to the procedure, which is scheduled for 05/26/2020  Will need lovenox bridge .  Please fax back/ or route the completed form to [place] at [phone].   Sincerely,    Tonita Phoenix

## 2020-04-02 NOTE — Patient Instructions (Signed)
You have been scheduled for a colonoscopy. Please follow written instructions given to you at your visit today.  Please pick up your prep supplies at the pharmacy within the next 1-3 days. If you use inhalers (even only as needed), please bring them with you on the day of your procedure. Your physician has requested that you go to www.startemmi.com and enter the access code given to you at your visit today. This web site gives a general overview about your procedure. However, you should still follow specific instructions given to you by our office regarding your preparation for the procedure.  You will be contaced by our office prior to your procedure for directions on holding your Coumadin/Warfarin.  If you do not hear from our office 1 week prior to your scheduled procedure, please call [phone] to discuss.  We will refill your Budesonide today  I appreciate the  opportunity to care for you  Thank You   Amy Shane Crutch

## 2020-04-02 NOTE — Telephone Encounter (Signed)
I have faxed anti coag letter to [phone]  Waiting on response

## 2020-04-03 NOTE — Telephone Encounter (Signed)
STOP Warfain 5 days prior to procedure and resume ASAP afterwards   NO Lovenox bridge necessary per fax from Dr Tiana Loft    LOW Risk DVT with short interruption of warfarin therapy per Dr Valora Piccolo  Will keep fax until after pt has procedure and then will have scanned in     [place] pt to inform information about coumadin

## 2020-04-07 ENCOUNTER — Encounter: Payer: Self-pay | Admitting: Physician Assistant

## 2020-04-07 DIAGNOSIS — K5190 Ulcerative colitis, unspecified, without complications: Secondary | ICD-10-CM | POA: Insufficient documentation

## 2020-04-07 NOTE — Progress Notes (Signed)
Subjective:    Patient ID: Paul Luna, male    DOB: [birth year], 79 y.o.   MRN: [id number]  HPI Paul Luna is a pleasant 79 year old white male, new to GI today, self-referred to establish, with history of ulcerative colitis on Entyvio. Patient has recently relocated from [place] to [place], and had been followed by Dr. Georgiann Mccoy in [place].  He was diagnosed with ulcerative colitis in 2016, had been treated with steroids initially and then budesonide which she has found helpful.  He is currently maintained on budesonide 3 mg/day.  He underwent follow-up colonoscopy in January 2021 with finding of Mayo score 1-2 pancolitis and also had several polyps removed.  Biopsies showed inflammatory polyps or serrated polyps stains were done for dysplasia which returned showing 3 serrated polyps and 2 inflamed serrated polyps no dysplasia but felt possibility of dysplasia significant.  Follow-up was recommended in 6 months.  Path from the remainder of the colon showed moderate colitis .  He was advised at that time to start biologic therapy and initiated Entyvio in February 2021.  His last infusion was 03/26/2020. He says he had been relatively asymptomatic prior to starting the [facility] and has not noticed any real change in symptoms as he had not been having any abdominal pain diarrhea or bleeding.  He continues to feel well. Last labs on 03/19/2020 c-Met unremarkable and CBC in March 2021 within normal limits. Patient has history of recurrent DVT and PE and has been maintained on Coumadin for several years.  Also with history of hypertension, osteoarthritis, anxiety, history of bladder cancer, and has had 2 hip repairs. He is recently established with Dr. Quillian Quince [name] in [place] for primary care. Family history is positive for colon cancer in his brother.  Review of Systems Pertinent positive and negative review of systems were noted in the above HPI section.  All other review of systems was otherwise  negative.  Outpatient Encounter Medications as of 04/02/2020  Medication Sig  . ascorbic acid (VITAMIN C) 500 MG tablet Take 500 mg by mouth daily.  . budesonide (ENTOCORT EC) 3 MG 24 hr capsule Take 1 capsule by mouth daily.  . Calcium Carbonate (CALCIUM 600 PO) Take 1 tablet by mouth daily.  . Cholecalciferol (VITAMIN D-3 PO) Take 1 tablet by mouth daily.  Marland Kitchen ezetimibe (ZETIA) 10 MG tablet Take 1 tablet by mouth daily.  . Omega-3 Fatty Acids (FISH OIL PO) Take 550 mg by mouth daily.  . sertraline (ZOLOFT) 100 MG tablet Take 200 mg by mouth daily.  . simvastatin (ZOCOR) 40 MG tablet Take 1 tablet by mouth daily.  . Vedolizumab (ENTYVIO IV) Inject into the vein.  Marland Kitchen warfarin (JANTOVEN) 6 MG tablet Take 6 mg by mouth daily.  . [EXPIRED] Na Sulfate-K Sulfate-Mg Sulf (SUPREP BOWEL PREP KIT) 17.5-3.13-1.6 GM/177ML SOLN Take 1 kit by mouth once for 1 dose.  . [DISCONTINUED] allopurinol (ZYLOPRIM) 300 MG tablet TAKE ONE TABLET BY MOUTH DAILY  . [DISCONTINUED] ezetimibe-simvastatin (VYTORIN) 10-20 MG per tablet Take 1 tablet by mouth daily.  . [DISCONTINUED] fenofibrate (TRICOR) 145 MG tablet Take 1 tablet (145 mg total) by mouth daily.  . [DISCONTINUED] KRILL OIL PO Take 1 capsule by mouth daily.  . [DISCONTINUED] losartan (COZAAR) 50 MG tablet Take 1 tablet (50 mg total) by mouth daily.  . [DISCONTINUED] sertraline (ZOLOFT) 100 MG tablet TAKE 1 TABLET (100 MG TOTAL) BY MOUTH DAILY. (Patient taking differently: Take 200 mg by mouth daily. )  . [DISCONTINUED] Warfarin Sodium (COUMADIN PO)  Take 4 mg by mouth daily.    No facility-administered encounter medications on file as of 04/02/2020.   Allergies  Allergen Reactions  . Iodine Nausea And Vomiting    childhood  . Shellfish Allergy Nausea And Vomiting    Gastrointestinal   Patient Active Problem List   Diagnosis Date Noted  . Ulcerative colitis ([place]) 04/07/2020  . PAC (premature atrial contraction) 02/13/2014  . Hyperlipidemia LDL goal <100  12/20/2013  . DDD (degenerative disc disease), lumbar 06/19/2013  . Long term current use of anticoagulant therapy 04/13/2013  . History of bladder cancer 12/12/2012  . Gout 06/22/2012  . Pulmonary emboli ([place]) 11/03/2011  . DVT (deep venous thrombosis) ([place]) 11/03/2011  . Hypertension 11/03/2011  . Arthritis 11/03/2011  . Anxiety 11/03/2011   Social History   Socioeconomic History  . Marital status: Married    Spouse name: Not on file  . Number of children: 2  . Years of education: Not on file  . Highest education level: Not on file  Occupational History  . Occupation: retired  Tobacco Use  . Smoking status: Former Smoker    Quit date: 10/03/1989    Years since quitting: 30.5  . Smokeless tobacco: Never Used  Vaping Use  . Vaping Use: Never used  Substance and Sexual Activity  . Alcohol use: Yes    Comment: 1 glass a week  . Drug use: No  . Sexual activity: Yes    Partners: Male    Birth control/protection: None  Other Topics Concern  . Not on file  Social History Narrative   From [place], [place]   Social Determinants of Health   Financial Resource Strain:   . Difficulty of Paying Living Expenses:   Food Insecurity:   . Worried About Charity fundraiser in the Last Year:   . Arboriculturist in the Last Year:   Transportation Needs:   . Film/video editor (Medical):   Marland Kitchen Lack of Transportation (Non-Medical):   Physical Activity:   . Days of Exercise per Week:   . Minutes of Exercise per Session:   Stress:   . Feeling of Stress :   Social Connections:   . Frequency of Communication with Friends and Family:   . Frequency of Social Gatherings with Friends and Family:   . Attends Religious Services:   . Active Member of Clubs or Organizations:   . Attends Archivist Meetings:   Marland Kitchen Marital Status:   Intimate Partner Violence:   . Fear of Current or Ex-Partner:   . Emotionally Abused:   Marland Kitchen Physically Abused:   . Sexually Abused:     Paul Luna  family history includes Arthritis in his father; Colon cancer (age of onset: 17) in his brother; Heart disease (age of onset: 59) in his brother; Heart disease (age of onset: 65) in his mother; Hypertension in his father and mother; Stroke (age of onset: 95) in his father.      Objective:    Vitals:   04/02/20 0936  BP: (!) 144/80  Pulse: 80    Physical Exam Well-developed well-nourished  Older WM/male in no acute distress.  Height, Weight,205  BMI27.8  HEENT; nontraumatic normocephalic, EOMI, PER R LA, sclera anicteric. Oropharynx;not examined Neck; supple, no JVD Cardiovascular; regular rate and rhythm with S1-S2, no murmur rub or gallop Pulmonary; Clear bilaterally Abdomen; soft, nontender, nondistended, no palpable mass or hepatosplenomegaly, bowel sounds are active Rectal;not done today Skin; benign exam, no jaundice rash  or appreciable lesions Extremities; no clubbing cyanosis or edema skin warm and dry Neuro/Psych; alert and oriented x4, grossly nonfocal mood and affect appropriate       Assessment & Plan:   #35 79 year old white male with pan ulcerative colitis, transferring care to our practice today as has recently relocated to [place].  He has been maintained on budesonide 3 mg p.o. daily, and initiated [facility] February 2021.  He has been feeling well and is asymptomatic. #2 multiple sessile serrated polyps at recent colonoscopy and inflamed serrated polyps, stains done for dysplasia which were negative but possibility of dysplasia felt significant, and therefore follow-up colonoscopy was recommended at a 26-monthinterval. #3 Chronic anticoagulation-patient maintained on Coumadin 4.,  History of recurrent DVT and PE 5.  Hypertension 6.  Prior history of bladder cancer 7.  Osteoarthritis #8 family history of colon cancer-patient's brother  Plan; refill budesonide 3 mg p.o. daily. We will plan to continue Entyvio 300 mg IV every 8 weeks, will be due for next dose  05/26/2020.  We will arrange for outpatient infusions. CBC and c-Met today Patient will be scheduled for colonoscopy with Dr. NSilverio Decamp  Procedure was discussed in detail with the patient including indications risks and benefits and he is agreeable to proceed. Patient will need to hold Coumadin for 5 days prior to procedure.  He relates that he has done Lovenox bridge in the past.  We will communicate with his new primary care physician Dr. DTiana Loftto arrange for Lovenox bridge. Patient has been vaccinated for COVID-19.   Othman Masur S Hernando Reali PA-C 04/07/2020   Cc: SShawnee Knapp MD

## 2020-04-08 NOTE — Telephone Encounter (Signed)
Ok, thank you

## 2020-04-22 ENCOUNTER — Telehealth: Payer: Self-pay

## 2020-04-22 NOTE — Telephone Encounter (Signed)
Records, insurance information and order for Entyvio infusions faxed to [facility].  Patient will need to have his appointment by 05/26/20 for continuation of the q 8 week maintenance dosing. He was previously receiving his infusion in a different city.

## 2020-04-27 ENCOUNTER — Other Ambulatory Visit: Payer: Self-pay

## 2020-04-27 DIAGNOSIS — K5100 Ulcerative (chronic) pancolitis without complications: Secondary | ICD-10-CM

## 2020-04-27 NOTE — Telephone Encounter (Signed)
Changed to Paul Luna Short Stay for 05/25/20 at 10:00 am. Patient notified of location and of the date.

## 2020-04-27 NOTE — Telephone Encounter (Signed)
Paul Luna from [facility] is requesting lab orders to be placed for Entyvio infusion. TB and Hep B surface antigen.  CB [phone] Ext [phone]

## 2020-05-25 ENCOUNTER — Ambulatory Visit (HOSPITAL_COMMUNITY)
Admission: RE | Admit: 2020-05-25 | Discharge: 2020-05-25 | Disposition: A | Discharge status: Home / Self Care | Payer: Medicare Other | Source: Ambulatory Visit | Attending: Gastroenterology | Admitting: Gastroenterology

## 2020-05-25 ENCOUNTER — Other Ambulatory Visit: Payer: Self-pay

## 2020-05-25 DIAGNOSIS — K5100 Ulcerative (chronic) pancolitis without complications: Secondary | ICD-10-CM | POA: Diagnosis present

## 2020-05-25 MED ORDER — VEDOLIZUMAB 300 MG IV SOLR
300.0000 mg | INTRAVENOUS | Status: DC
  Administered 2020-05-25: 300 mg via INTRAVENOUS
  Filled 2020-05-25: qty 5

## 2020-05-26 ENCOUNTER — Ambulatory Visit: Payer: Medicare Other | Admitting: Gastroenterology

## 2020-05-26 ENCOUNTER — Encounter: Payer: Self-pay | Admitting: Gastroenterology

## 2020-05-26 ENCOUNTER — Other Ambulatory Visit: Payer: Self-pay

## 2020-05-26 VITALS — BP 146/85 | HR 76 | Temp 97.1°F | Ht 72.0 in | Wt 205.0 lb

## 2020-05-26 MED ORDER — SODIUM CHLORIDE 0.9 % IV SOLN: 500.0000 mL | Freq: Once | INTRAVENOUS | Status: DC

## 2020-05-26 NOTE — Progress Notes (Signed)
Pt's states no medical or surgical changes since previsit or office visit.  VS NS  Last dose of Coumadin was today 05/26/20.  Notified Dr. Silverio Decamp. States will look at him but may not be able to remove certain polyps.  On further discussion pt requested to talk to Dr. Silverio Decamp.   It was decided to stop to reschedule  Procedure being sure to hold the coumadin   Procedure rescheduled 9/9 at 8:00  New instructions printed and given to pt after review

## 2020-05-26 NOTE — Progress Notes (Signed)
Discussed with patient that given h/o colon polyps, ulcerative colitis and family h/o colon cancer, he will likely need multiple biopsies for surveillance of UC and also may not be able to remove any large polyps due to increased risk of bleeding.   Patient was instructed to hold coumadin for 5 days and that he doesn't need Lovenox bridge per Dr Valora Piccolo. Patient misunderstood and didn't think he had to hold taking Coumadin as he was previously always placed on Lovenox bridge prior to procedure.  After reviewing potential benefits and risks, he agrees to reschedule procedure  The risks and benefits as well as alternatives of endoscopic procedure(s) have been discussed and reviewed. All questions answered. The patient agrees to proceed.

## 2020-06-11 ENCOUNTER — Ambulatory Visit (AMBULATORY_SURGERY_CENTER): Payer: Medicare Other | Admitting: Gastroenterology

## 2020-06-11 ENCOUNTER — Telehealth: Payer: Self-pay | Admitting: *Deleted

## 2020-06-11 ENCOUNTER — Encounter: Payer: Self-pay | Admitting: Gastroenterology

## 2020-06-11 ENCOUNTER — Other Ambulatory Visit: Payer: Self-pay

## 2020-06-11 VITALS — BP 131/75 | HR 61 | Temp 97.8°F | Resp 15 | Ht 72.0 in | Wt 205.0 lb

## 2020-06-11 DIAGNOSIS — Z86010 Personal history of colonic polyps: Secondary | ICD-10-CM

## 2020-06-11 DIAGNOSIS — K5100 Ulcerative (chronic) pancolitis without complications: Secondary | ICD-10-CM | POA: Diagnosis present

## 2020-06-11 MED ORDER — SODIUM CHLORIDE 0.9 % IV SOLN: 500.0000 mL | Freq: Once | INTRAVENOUS | Status: DC

## 2020-06-11 MED ORDER — BUDESONIDE ER 9 MG PO TB24: 9.0000 mg | ORAL_TABLET | Freq: Two times a day (BID) | ORAL | 3 refills | Status: DC

## 2020-06-11 NOTE — Op Note (Signed)
[facility] Patient Name: Paul Luna Procedure Date: 06/11/2020 8:01 AM MRN: [id number] Endoscopist: Mauri Pole , MD Age: 79 Referring MD:  Date of Birth: April 01, 1941 Gender: Male Account #: [id number] Procedure:                Colonoscopy Indications:              High risk colon cancer surveillance: Personal                            history of sessile serrated colon polyp (10 mm or                            greater in size), High risk colon cancer                            surveillance: Ulcerative colitis with known                            dysplasia (previous visible dysplastic lesion                            removed completely) Medicines:                Monitored Anesthesia Care Procedure:                Pre-Anesthesia Assessment:                           - Prior to the procedure, a History and Physical                            was performed, and patient medications and                            allergies were reviewed. The patient's tolerance of                            previous anesthesia was also reviewed. The risks                            and benefits of the procedure and the sedation                            options and risks were discussed with the patient.                            All questions were answered, and informed consent                            was obtained. Prior Anticoagulants: The patient                            last took Coumadin (warfarin) 5 days prior to the  procedure. ASA Grade Assessment: III - A patient                            with severe systemic disease. After reviewing the                            risks and benefits, the patient was deemed in                            satisfactory condition to undergo the procedure.                           After obtaining informed consent, the colonoscope                            was passed under direct vision. Throughout the                             procedure, the patient's blood pressure, pulse, and                            oxygen saturations were monitored continuously. The                            Colonoscope was introduced through the anus and                            advanced to the the cecum, identified by the                            appendiceal orifice, IC valve and                            transillumination. The colonoscopy was performed                            without difficulty. The patient tolerated the                            procedure well. The quality of the bowel                            preparation was adequate. Scope In: 8:15:17 AM Scope Out: 8:30:15 AM Scope Withdrawal Time: 0 hours 12 minutes 20 seconds  Total Procedure Duration: 0 hours 14 minutes 58 seconds  Findings:                 The perianal and digital rectal examinations were                            normal.                           Inflammation characterized by altered vascularity,  congestion (edema), erosions, erythema,                            granularity, loss of vascularity, mucus, scarring,                            aphthous ulcerations, deep ulcerations and shallow                            ulcerations was found as medium patches surrounded                            by normal mucosa in the rectum, in the sigmoid                            colon, in the descending colon, in the transverse                            colon and in the ascending colon. No sites were                            spared. This was mild in severity. Biopsies were                            taken with a cold forceps for histology.                           A few small-mouthed diverticula were found in the                            sigmoid colon and ascending colon.                           Non-bleeding internal hemorrhoids were found during                            retroflexion. The hemorrhoids were  small. Complications:            No immediate complications. Estimated Blood Loss:     Estimated blood loss was minimal. Impression:               - Pancolitis. Inflammation was found in the rectum,                            in the sigmoid colon, in the descending colon, in                            the transverse colon and in the ascending colon.                            This was mild in severity. Biopsied.                           - Diverticulosis in the sigmoid colon and in the  ascending colon.                           - Non-bleeding internal hemorrhoids. Recommendation:           - Patient has a contact number available for                            emergencies. The signs and symptoms of potential                            delayed complications were discussed with the                            patient. Return to normal activities tomorrow.                            Written discharge instructions were provided to the                            patient.                           - Resume previous diet.                           - Continue present medications.                           - Resume Coumadin (warfarin) at prior dose tomorrow.                           - Await pathology results.                           - Repeat colonoscopy in 1 year for surveillance                            based on pathology results.                           - Use Uceris (budesonide) 9 mg PO BID for 1 month                            followed by taper 9mg  daily X 1 month, 6mg  daily X1                            month and then 3mg  daily.                           - Return to my office at the next available                            appointment. Mauri Pole, MD 06/11/2020 8:41:27 AM This report has been signed electronically.

## 2020-06-11 NOTE — Telephone Encounter (Signed)
Sent prior auth to cover my meds today for Budesonide

## 2020-06-11 NOTE — Progress Notes (Signed)
Pt's states no medical or surgical changes since previsit or office visit.   V/s-cw  Check-in-jb

## 2020-06-11 NOTE — Progress Notes (Signed)
Report given to PACU, vss 

## 2020-06-11 NOTE — Progress Notes (Signed)
Called to room to assist during endoscopic procedure.  Patient ID and intended procedure confirmed with present staff. Received instructions for my participation in the procedure from the performing physician.  

## 2020-06-11 NOTE — Patient Instructions (Signed)
Handouts given for Ulcerative Colitis, diverticulosis and hemorrhoids.  Pick up new Rx(dose increase for Budesonide)  RESUME COUMADIN TOMORROW, 06/12/20 AT PRIOR DOSE.   YOU HAD AN ENDOSCOPIC PROCEDURE TODAY AT [facility]:   Refer to the procedure report that was given to you for any specific questions about what was found during the examination.  If the procedure report does not answer your questions, please call your gastroenterologist to clarify.  If you requested that your care partner not be given the details of your procedure findings, then the procedure report has been included in a sealed envelope for you to review at your convenience later.  YOU SHOULD EXPECT: Some feelings of bloating in the abdomen. Passage of more gas than usual.  Walking can help get rid of the air that was put into your GI tract during the procedure and reduce the bloating. If you had a lower endoscopy (such as a colonoscopy or flexible sigmoidoscopy) you may notice spotting of blood in your stool or on the toilet paper. If you underwent a bowel prep for your procedure, you may not have a normal bowel movement for a few days.  Please Note:  You might notice some irritation and congestion in your nose or some drainage.  This is from the oxygen used during your procedure.  There is no need for concern and it should clear up in a day or so.  SYMPTOMS TO REPORT IMMEDIATELY:   Following lower endoscopy (colonoscopy or flexible sigmoidoscopy):  Excessive amounts of blood in the stool  Significant tenderness or worsening of abdominal pains  Swelling of the abdomen that is new, acute  Fever of 100F or higher  For urgent or emergent issues, a gastroenterologist can be reached at any hour by calling [phone]. Do not use MyChart messaging for urgent concerns.    DIET:  We do recommend a small meal at first, but then you may proceed to your regular diet.  Drink plenty of fluids but you should avoid  alcoholic beverages for 24 hours.  ACTIVITY:  You should plan to take it easy for the rest of today and you should NOT DRIVE or use heavy machinery until tomorrow (because of the sedation medicines used during the test).    FOLLOW UP: Our staff will call the number listed on your records 48-72 hours following your procedure to check on you and address any questions or concerns that you may have regarding the information given to you following your procedure. If we do not reach you, we will leave a message.  We will attempt to reach you two times.  During this call, we will ask if you have developed any symptoms of COVID 19. If you develop any symptoms (ie: fever, flu-like symptoms, shortness of breath, cough etc.) before then, please call [phone].  If you test positive for Covid 19 in the 2 weeks post procedure, please call and report this information to [place].    If any biopsies were taken you will be contacted by phone or by letter within the next 1-3 weeks.  Please call us at [phone] if you have not heard about the biopsies in 3 weeks.    SIGNATURES/CONFIDENTIALITY: You and/or your care partner have signed paperwork which will be entered into your electronic medical record.  These signatures attest to the fact that that the information above on your After Visit Summary has been reviewed and is understood.  Full responsibility of the confidentiality of this discharge  information lies with you and/or your care-partner.

## 2020-06-12 ENCOUNTER — Other Ambulatory Visit: Payer: Self-pay

## 2020-06-12 MED ORDER — BUDESONIDE 3 MG PO CPEP: ORAL_CAPSULE | ORAL | 3 refills | Status: DC

## 2020-06-12 NOTE — Telephone Encounter (Signed)
I called the pharmacy and let them know that 30 days is approved not 90 days as patient had requested at the pharmacy.

## 2020-06-12 NOTE — Telephone Encounter (Signed)
Its fine to change the Rx to 30 day supply and give him 3 refills. Thank you

## 2020-06-12 NOTE — Telephone Encounter (Signed)
Dr Silverio Decamp the budesonide was denied however it says that they will provide the patient with a 30 day supply not 90 days. Will that be enough or do you want to change the rx?

## 2020-06-15 ENCOUNTER — Telehealth: Payer: Self-pay

## 2020-06-15 NOTE — Telephone Encounter (Signed)
  Follow up Call-  Call back number 06/11/2020 05/26/2020  Post procedure Call Back phone  # [phone]  Permission to leave phone message Yes Yes  Some recent data might be hidden     Patient questions:  Do you have a fever, pain , or abdominal swelling? No. Pain Score  0 *  Have you tolerated food without any problems? Yes.    Have you been able to return to your normal activities? Yes.    Do you have any questions about your discharge instructions: Diet   No. Medications  No. Follow up visit  No.  Do you have questions or concerns about your Care? No.  Actions: * If pain score is 4 or above: No action needed, pain <4. 1. Have you developed a fever since your procedure? no  2.   Have you had an respiratory symptoms (SOB or cough) since your procedure? no  3.   Have you tested positive for COVID 19 since your procedure no  4.   Have you had any family members/close contacts diagnosed with the COVID 19 since your procedure?  No  Pt did report that his rx for BUDESONIDE 9 mg was changed to 3mg  tabs d/t price of rx.  Pt would be taking more tabs to equal 9 mg dose.  Pt said he has not started regiment yet d/t rx was ordered and not at [facility] yet.  I asked pt to please call the office if he is not able to get the rx in a timely manner.  He said that he would . maw   If yes to any of these questions please route to Joylene John, RN and Joella Prince, RN

## 2020-06-23 ENCOUNTER — Other Ambulatory Visit: Payer: Self-pay

## 2020-06-23 MED ORDER — BUDESONIDE 3 MG PO CPEP: ORAL_CAPSULE | ORAL | 3 refills | Status: DC

## 2020-07-02 ENCOUNTER — Ambulatory Visit (HOSPITAL_COMMUNITY)
Admission: EM | Admit: 2020-07-02 | Discharge: 2020-07-02 | Disposition: A | Discharge status: Home / Self Care | Payer: Medicare Other | Attending: Emergency Medicine | Admitting: Emergency Medicine

## 2020-07-02 ENCOUNTER — Other Ambulatory Visit: Payer: Self-pay

## 2020-07-02 ENCOUNTER — Encounter (HOSPITAL_COMMUNITY): Payer: Self-pay

## 2020-07-02 DIAGNOSIS — M79651 Pain in right thigh: Secondary | ICD-10-CM | POA: Diagnosis not present

## 2020-07-02 MED ORDER — LIDOCAINE 5 % EX PTCH: 1.0000 | MEDICATED_PATCH | CUTANEOUS | 0 refills | Status: DC

## 2020-07-02 MED ORDER — HYDROCODONE-ACETAMINOPHEN 5-325 MG PO TABS: 1.0000 | ORAL_TABLET | Freq: Four times a day (QID) | ORAL | 0 refills | Status: DC | PRN

## 2020-07-02 NOTE — ED Provider Notes (Signed)
HPI  SUBJECTIVE:  Paul Luna is a 79 y.o. male who presents with 3 to 4 days of right anterior upper thigh pain.  Does not go down to his knee or distally.  He states that he is unable to flex his hip secondary to the pain and that the pain is getting worse.  He describes it as stabbing, constant. it woke him up early Monday morning.  He also reports mild stinging pain along his inner thigh.  There is no pain distally.  He reports tender swelling inferior to the inguinal ligament along his upper medial thigh starting today.  No erythema, induration of this area.  No groin pain, no testicular or scrotal pain, swelling.  No change in physical activity, trauma to the area, fall, twisting of his hip.  No nausea, vomiting, fevers.  No abdominal pain.  No rash.  No distal numbness or tingling in his right leg, knee pain.  No distal color changes, calf swelling.  He has tried ice packs and Tylenol without improvement in his symptoms.  Symptoms are worse with movement, walking and trying to get up.  He has a past medical history of ulcerative pancolitis, on warfarin for PE/DVT.  Therapeutic on his warfarin.  He has a history of bladder cancer, degenerative disc disease, hypertension, bilateral total hip placements, lumbar laminectomy/discectomy.  No history of aortic abdominal aneurysm, diabetes.  KDX:IPJA, [name], MD  Last INR is therapeutic at 2.9 as of 05/15/2020.  Past Medical History:  Diagnosis Date  . Adenomatous colon polyp   . Anxiety    chronic  . Arthritis   . Bladder cancer ([place]) 1991  . Clotting disorder ([place]) 2017   following hip surgery  . HLD (hyperlipidemia)   . Hypertension   . UC (ulcerative colitis) [facility])     Past Surgical History:  Procedure Laterality Date  . bladder cancer surgery  1991   managed by urologist  . COLONOSCOPY  2021   Hx of many colon's; last 6 months ago, todays is a follow up to the   . COLONOSCOPY W/ BIOPSIES AND POLYPECTOMY     Hx: of  . LUMBAR  LAMINECTOMY/DECOMPRESSION MICRODISCECTOMY Right 04/08/2013   Procedure: LUMBAR FOUR-FIVE LUMBAR LAMINECTOMY/DECOMPRESSION MICRODISCECTOMY 1 LEVEL;  Surgeon: Charlie Pitter, MD;  Location: [place];  Service: Neurosurgery;  Laterality: Right;  . TOTAL HIP ARTHROPLASTY Right   . TOTAL HIP ARTHROPLASTY Left     Family History  Problem Relation Age of Onset  . Hypertension Mother   . Heart disease Mother 42  . Hypertension Father   . Arthritis Father   . Stroke Father 24  . Colon cancer Brother 52  . Colon polyps Brother   . Heart disease Brother 39  . Esophageal cancer Neg Hx   . Rectal cancer Neg Hx   . Stomach cancer Neg Hx     Social History   Tobacco Use  . Smoking status: Former Smoker    Quit date: 10/03/1989    Years since quitting: 30.7  . Smokeless tobacco: Never Used  Vaping Use  . Vaping Use: Never used  Substance Use Topics  . Alcohol use: Yes    Comment: 1 glass a week  . Drug use: No    No current facility-administered medications for this encounter.  Current Outpatient Medications:  .  ascorbic acid (VITAMIN C) 500 MG tablet, Take 500 mg by mouth daily., Disp: , Rfl:  .  budesonide (ENTOCORT EC) 3 MG 24 hr  capsule, Take 9 mg by mouth in the morning and at bedtime. Dose will be tapered monthly: 9 mg twice daily for 30 days then 9 mg once Daily for 30 days, Then 6 mg once daily for 30 days, then continue 3 mg once daily, Disp: 330 capsule, Rfl: 3 .  Calcium Carbonate (CALCIUM 600 PO), Take 1 tablet by mouth daily., Disp: , Rfl:  .  Cholecalciferol (VITAMIN D-3 PO), Take 1 tablet by mouth daily., Disp: , Rfl:  .  ezetimibe (ZETIA) 10 MG tablet, Take 1 tablet by mouth daily., Disp: , Rfl:  .  HYDROcodone-acetaminophen (NORCO/VICODIN) 5-325 MG tablet, Take 1 tablet by mouth every 6 (six) hours as needed for moderate pain or severe pain., Disp: 12 tablet, Rfl: 0 .  lidocaine (LIDODERM) 5 %, Place 1 patch onto the skin daily. Remove & Discard patch within 12 hours or  as directed by MD, Disp: 7 patch, Rfl: 0 .  Omega-3 Fatty Acids (FISH OIL PO), Take 550 mg by mouth daily., Disp: , Rfl:  .  sertraline (ZOLOFT) 100 MG tablet, Take 200 mg by mouth daily., Disp: , Rfl:  .  simvastatin (ZOCOR) 40 MG tablet, Take 1 tablet by mouth daily., Disp: , Rfl:  .  Vedolizumab (ENTYVIO IV), Inject into the vein., Disp: , Rfl:  .  warfarin (JANTOVEN) 6 MG tablet, Take 6 mg by mouth daily., Disp: , Rfl:   No Known Allergies   ROS  As noted in HPI.   Physical Exam  BP (!) 154/91 (BP Location: Right Arm)   Pulse 80   Temp 98.1 F (36.7 C) (Oral)   Resp 19   SpO2 95%   Constitutional: Well developed, well nourished, no acute distress Eyes:  EOMI, conjunctiva normal bilaterally HENT: Normocephalic, atraumatic,mucus membranes moist Respiratory: Normal inspiratory effort Cardiovascular: Normal rate GI: nondistended soft, nontender skin: No rash over the area of pain, in the groin, down the leg, skin intact Musculoskeletal: Tender swelling inferior to the inguinal ligament along the medial thigh.  No erythema, induration.  No bruit in this area.  No palpable pain in the area of the upper anterior thigh where patient reports pain.  No pain with passive internal/external rotation of the hip.  Pain with passive flexion of the hip.  Pain aggravated with active flexion of the hip.  Sensation distally intact.  PT 2+.  Toes warm, pink.  No hip tenderness, pain with passive internal/external rotation/extension of the hip. Back: No lumbar tenderness, paralumbar tenderness. GU: Normal testicles, normal scrotum.  Patient declined chaperone Neurologic: Alert & oriented x 3, no focal neuro deficits Psychiatric: Speech and behavior appropriate   ED Course   Medications - No data to display  Orders Placed This Encounter  Procedures  . AMB referral to sports medicine    Referral Priority:   Urgent    Referral Type:   Consultation    Referral Reason:   Specialty Services  Required    Number of Visits Requested:   1    No results found for this or any previous visit (from the past 24 hour(s)). No results found.  ED Clinical Impression  1. Acute pain of right thigh      ED Assessment/Plan  Reviewed outside labs. Calculated creatinine clearance based on labs from 03/19/2020 89 mL/min.  [facility] narcotic database reviewed.  Feel that is appropriate to send home with a controlled substance.  No opiate prescriptions in 2 years.  Unsure as to the etiology of  the patient's symptoms, however, it does not appear to be a septic joint or vascular issue such as femoral artery dissection.  No evidence of shingles, cellulitis.  Plan to send home with Tylenol or Norco, lidocaine patch, ordered a referral to sports medicine.  Feel that an ultrasound be reasonable next step.  Strict ER return precautions given.  Discussed  MDM, treatment plan, and plan for follow-up with patient. Discussed sn/sx that should prompt return to the ED. patient agrees with plan.   Meds ordered this encounter  Medications  . HYDROcodone-acetaminophen (NORCO/VICODIN) 5-325 MG tablet    Sig: Take 1 tablet by mouth every 6 (six) hours as needed for moderate pain or severe pain.    Dispense:  12 tablet    Refill:  0  . lidocaine (LIDODERM) 5 %    Sig: Place 1 patch onto the skin daily. Remove & Discard patch within 12 hours or as directed by MD    Dispense:  7 patch    Refill:  0    *This clinic note was created using [employer]. Therefore, there may be occasional mistakes despite careful proofreading.   ?    Melynda Ripple, MD 07/03/20 [phone]

## 2020-07-02 NOTE — ED Triage Notes (Signed)
Pt presents with right sided hip pain and lower back pain x 4 days. Pt reports he used [employer] and he think he may pulled the "hip flexor". Tylenol gives somewhat relief, last dose over 6 hrs ago.

## 2020-07-02 NOTE — Discharge Instructions (Addendum)
Try ice or heat, whichever feels better.  Lidoderm patch should help.  Take a Tylenol containing product 3-4 times a day as needed for pain.  1000 mg of Tylenol for mild to moderate pain, 1-2 Norco for severe pain.  Be extremely careful with the Norco as it can affect your balance and cause you to fall.  Do not take it at night.

## 2020-07-03 ENCOUNTER — Emergency Department (HOSPITAL_COMMUNITY): Payer: Medicare Other

## 2020-07-03 ENCOUNTER — Inpatient Hospital Stay (HOSPITAL_COMMUNITY)
Admission: EM | Admit: 2020-07-03 | Discharge: 2020-07-07 | DRG: 394 | Disposition: A | Discharge status: Home Health | Payer: Medicare Other | Attending: Internal Medicine | Admitting: Internal Medicine

## 2020-07-03 ENCOUNTER — Encounter (HOSPITAL_COMMUNITY): Payer: Self-pay | Admitting: Emergency Medicine

## 2020-07-03 ENCOUNTER — Emergency Department (HOSPITAL_BASED_OUTPATIENT_CLINIC_OR_DEPARTMENT_OTHER): Payer: Medicare Other

## 2020-07-03 ENCOUNTER — Other Ambulatory Visit: Payer: Self-pay

## 2020-07-03 DIAGNOSIS — F419 Anxiety disorder, unspecified: Secondary | ICD-10-CM | POA: Diagnosis present

## 2020-07-03 DIAGNOSIS — Z20822 Contact with and (suspected) exposure to covid-19: Secondary | ICD-10-CM | POA: Diagnosis present

## 2020-07-03 DIAGNOSIS — M79604 Pain in right leg: Secondary | ICD-10-CM | POA: Diagnosis not present

## 2020-07-03 DIAGNOSIS — Z87891 Personal history of nicotine dependence: Secondary | ICD-10-CM

## 2020-07-03 DIAGNOSIS — T45515A Adverse effect of anticoagulants, initial encounter: Secondary | ICD-10-CM | POA: Diagnosis present

## 2020-07-03 DIAGNOSIS — I10 Essential (primary) hypertension: Secondary | ICD-10-CM | POA: Diagnosis present

## 2020-07-03 DIAGNOSIS — M549 Dorsalgia, unspecified: Secondary | ICD-10-CM

## 2020-07-03 DIAGNOSIS — Z86010 Personal history of colonic polyps: Secondary | ICD-10-CM

## 2020-07-03 DIAGNOSIS — Z981 Arthrodesis status: Secondary | ICD-10-CM

## 2020-07-03 DIAGNOSIS — K661 Hemoperitoneum: Secondary | ICD-10-CM | POA: Diagnosis not present

## 2020-07-03 DIAGNOSIS — Z86718 Personal history of other venous thrombosis and embolism: Secondary | ICD-10-CM

## 2020-07-03 DIAGNOSIS — Z8551 Personal history of malignant neoplasm of bladder: Secondary | ICD-10-CM

## 2020-07-03 DIAGNOSIS — Z8371 Family history of colonic polyps: Secondary | ICD-10-CM

## 2020-07-03 DIAGNOSIS — Y929 Unspecified place or not applicable: Secondary | ICD-10-CM

## 2020-07-03 DIAGNOSIS — R1031 Right lower quadrant pain: Secondary | ICD-10-CM

## 2020-07-03 DIAGNOSIS — Z7901 Long term (current) use of anticoagulants: Secondary | ICD-10-CM

## 2020-07-03 DIAGNOSIS — Z79899 Other long term (current) drug therapy: Secondary | ICD-10-CM

## 2020-07-03 DIAGNOSIS — R58 Hemorrhage, not elsewhere classified: Secondary | ICD-10-CM | POA: Diagnosis present

## 2020-07-03 DIAGNOSIS — E785 Hyperlipidemia, unspecified: Secondary | ICD-10-CM | POA: Diagnosis present

## 2020-07-03 DIAGNOSIS — T80818A Extravasation of other vesicant agent, initial encounter: Secondary | ICD-10-CM | POA: Diagnosis present

## 2020-07-03 DIAGNOSIS — Z96643 Presence of artificial hip joint, bilateral: Secondary | ICD-10-CM | POA: Diagnosis present

## 2020-07-03 DIAGNOSIS — S301XXA Contusion of abdominal wall, initial encounter: Secondary | ICD-10-CM | POA: Diagnosis present

## 2020-07-03 DIAGNOSIS — Z823 Family history of stroke: Secondary | ICD-10-CM

## 2020-07-03 DIAGNOSIS — Z8261 Family history of arthritis: Secondary | ICD-10-CM

## 2020-07-03 DIAGNOSIS — Z86711 Personal history of pulmonary embolism: Secondary | ICD-10-CM

## 2020-07-03 DIAGNOSIS — Z800 Family history of malignant neoplasm of digestive organs: Secondary | ICD-10-CM

## 2020-07-03 DIAGNOSIS — M25551 Pain in right hip: Secondary | ICD-10-CM

## 2020-07-03 DIAGNOSIS — K5190 Ulcerative colitis, unspecified, without complications: Secondary | ICD-10-CM | POA: Diagnosis present

## 2020-07-03 DIAGNOSIS — Z8249 Family history of ischemic heart disease and other diseases of the circulatory system: Secondary | ICD-10-CM

## 2020-07-03 DIAGNOSIS — D6832 Hemorrhagic disorder due to extrinsic circulating anticoagulants: Secondary | ICD-10-CM | POA: Diagnosis present

## 2020-07-03 LAB — CBC WITH DIFFERENTIAL/PLATELET
Abs Immature Granulocytes: 0.02 10*3/uL (ref 0.00–0.07)
Basophils Absolute: 0 10*3/uL (ref 0.0–0.1)
Basophils Relative: 1 %
Eosinophils Absolute: 0.1 10*3/uL (ref 0.0–0.5)
Eosinophils Relative: 2 %
HCT: 37.7 % — ABNORMAL LOW (ref 39.0–52.0)
Hemoglobin: 12.9 g/dL — ABNORMAL LOW (ref 13.0–17.0)
Immature Granulocytes: 0 %
Lymphocytes Relative: 17 %
Lymphs Abs: 1.5 10*3/uL (ref 0.7–4.0)
MCH: 30.6 pg (ref 26.0–34.0)
MCHC: 34.2 g/dL (ref 30.0–36.0)
MCV: 89.5 fL (ref 80.0–100.0)
Monocytes Absolute: 0.7 10*3/uL (ref 0.1–1.0)
Monocytes Relative: 9 %
Neutro Abs: 6.2 10*3/uL (ref 1.7–7.7)
Neutrophils Relative %: 71 %
Platelets: 215 10*3/uL (ref 150–400)
RBC: 4.21 MIL/uL — ABNORMAL LOW (ref 4.22–5.81)
RDW: 13.9 % (ref 11.5–15.5)
WBC: 8.6 10*3/uL (ref 4.0–10.5)
nRBC: 0 % (ref 0.0–0.2)

## 2020-07-03 LAB — COMPREHENSIVE METABOLIC PANEL
ALT: 16 U/L (ref 0–44)
AST: 18 U/L (ref 15–41)
Albumin: 4.1 g/dL (ref 3.5–5.0)
Alkaline Phosphatase: 55 U/L (ref 38–126)
Anion gap: 13 (ref 5–15)
BUN: 19 mg/dL (ref 8–23)
CO2: 21 mmol/L — ABNORMAL LOW (ref 22–32)
Calcium: 8.8 mg/dL — ABNORMAL LOW (ref 8.9–10.3)
Chloride: 105 mmol/L (ref 98–111)
Creatinine, Ser: 0.84 mg/dL (ref 0.61–1.24)
GFR calc Af Amer: >60 mL/min (ref >60)
GFR calc non Af Amer: >60 mL/min (ref >60)
Glucose, Bld: 120 mg/dL — ABNORMAL HIGH (ref 70–99)
Potassium: 3.5 mmol/L (ref 3.5–5.1)
Sodium: 139 mmol/L (ref 135–145)
Total Bilirubin: 0.9 mg/dL (ref 0.3–1.2)
Total Protein: 6.8 g/dL (ref 6.5–8.1)

## 2020-07-03 LAB — LIPASE, BLOOD: Lipase: 27 U/L (ref 11–51)

## 2020-07-03 MED ORDER — FENTANYL CITRATE (PF) 100 MCG/2ML IJ SOLN
50.0000 ug | Freq: Once | INTRAMUSCULAR | Status: AC
  Administered 2020-07-03: 50 ug via INTRAVENOUS
  Filled 2020-07-03: qty 2

## 2020-07-03 MED ORDER — HYDROMORPHONE HCL 1 MG/ML IJ SOLN
0.5000 mg | Freq: Once | INTRAMUSCULAR | Status: AC
  Administered 2020-07-03: 0.5 mg via INTRAVENOUS
  Filled 2020-07-03: qty 1

## 2020-07-03 MED ORDER — IOHEXOL 300 MG/ML  SOLN
100.0000 mL | Freq: Once | INTRAMUSCULAR | Status: AC | PRN
  Administered 2020-07-03: 100 mL via INTRAVENOUS

## 2020-07-03 NOTE — ED Provider Notes (Signed)
[facility] DEPT Provider Note   CSN: [id number] Arrival date & time: 07/03/20  1743     History Chief Complaint  Patient presents with  . Leg Pain    Paul Luna is a 79 y.o. male.  HPI Patient is a 79 year old male with a history of right hip surgery approximately 5 years ago, HLD, HTN, ulcerative colitis on immunosuppressive infusions history is of DVT after surgery on warfarin  Patient is presented to emergency department with 5 days of right upper leg pain that seems to be in the hip and right anterior thigh.  It is severe 10/10 was originally only mild and seem to start in the middle the night when he woke up with the pain but now has progressed to pain medicine severe that he is having difficulty moving his right leg at all.  He denies any fevers or chills denies any trauma or injuries to the area denies any redness or swelling.  He states he has had no missed dose of his of his warfarin and his INR has been therapeutic.  He states that he also has some mild left-sided back pain but no midline back pain denies any saddle anesthesia, bowel or bladder incontinence or fevers.  He also denies any groin pain or testicular pain.  He has done no recent heavy lifting or exercises.  He states he went to urgent care yesterday and was given some Norco which he took this morning and most recently at 1:30 PM he states this is barely touch the pain that is been significantly worse.  He does also have some numbness of the right anterior thigh.    Past Medical History:  Diagnosis Date  . Adenomatous colon polyp   . Anxiety    chronic  . Arthritis   . Bladder cancer ([place]) 1991  . Clotting disorder ([place]) 2017   following hip surgery  . HLD (hyperlipidemia)   . Hypertension   . UC (ulcerative colitis) [facility])     Patient Active Problem List   Diagnosis Date Noted  . Ulcerative colitis ([place]) 04/07/2020  . PAC (premature atrial contraction) 02/13/2014  .  Hyperlipidemia LDL goal <100 12/20/2013  . DDD (degenerative disc disease), lumbar 06/19/2013  . Long term current use of anticoagulant therapy 04/13/2013  . History of bladder cancer 12/12/2012  . Gout 06/22/2012  . Pulmonary emboli ([place]) 11/03/2011  . DVT (deep venous thrombosis) ([place]) 11/03/2011  . Hypertension 11/03/2011  . Arthritis 11/03/2011  . Anxiety 11/03/2011    Past Surgical History:  Procedure Laterality Date  . bladder cancer surgery  1991   managed by urologist  . COLONOSCOPY  2021   Hx of many colon's; last 6 months ago, todays is a follow up to the   . COLONOSCOPY W/ BIOPSIES AND POLYPECTOMY     Hx: of  . LUMBAR LAMINECTOMY/DECOMPRESSION MICRODISCECTOMY Right 04/08/2013   Procedure: LUMBAR FOUR-FIVE LUMBAR LAMINECTOMY/DECOMPRESSION MICRODISCECTOMY 1 LEVEL;  Surgeon: Charlie Pitter, MD;  Location: [facility];  Service: Neurosurgery;  Laterality: Right;  . TOTAL HIP ARTHROPLASTY Right   . TOTAL HIP ARTHROPLASTY Left        Family History  Problem Relation Age of Onset  . Hypertension Mother   . Heart disease Mother 64  . Hypertension Father   . Arthritis Father   . Stroke Father 56  . Colon cancer Brother 56  . Colon polyps Brother   . Heart disease Brother 37  . Esophageal cancer Neg Hx   .  Rectal cancer Neg Hx   . Stomach cancer Neg Hx     Social History   Tobacco Use  . Smoking status: Former Smoker    Quit date: 10/03/1989    Years since quitting: 30.7  . Smokeless tobacco: Never Used  Vaping Use  . Vaping Use: Never used  Substance Use Topics  . Alcohol use: Yes    Comment: 1 glass a week  . Drug use: No    Home Medications Prior to Admission medications   Medication Sig Start Date End Date Taking? Authorizing Provider  ascorbic acid (VITAMIN C) 500 MG tablet Take 500 mg by mouth daily.    [provider]  budesonide (ENTOCORT EC) 3 MG 24 hr capsule Take 9 mg by mouth in the morning and at bedtime. Dose will be tapered monthly: 9  mg twice daily for 30 days then 9 mg once Daily for 30 days, Then 6 mg once daily for 30 days, then continue 3 mg once daily 06/23/20   Mauri Pole, MD  Calcium Carbonate (CALCIUM 600 PO) Take 1 tablet by mouth daily.    [provider]  Cholecalciferol (VITAMIN D-3 PO) Take 1 tablet by mouth daily.    [provider]  ezetimibe (ZETIA) 10 MG tablet Take 1 tablet by mouth daily. 02/17/20   [provider]  HYDROcodone-acetaminophen (NORCO/VICODIN) 5-325 MG tablet Take 1 tablet by mouth every 6 (six) hours as needed for moderate pain or severe pain. 07/02/20   Melynda Ripple, MD  lidocaine (LIDODERM) 5 % Place 1 patch onto the skin daily. Remove & Discard patch within 12 hours or as directed by MD 07/02/20   Melynda Ripple, MD  Omega-3 Fatty Acids (FISH OIL PO) Take 550 mg by mouth daily.    [provider]  sertraline (ZOLOFT) 100 MG tablet Take 200 mg by mouth daily.    [provider]  simvastatin (ZOCOR) 40 MG tablet Take 1 tablet by mouth daily. 02/17/20   [provider]  Vedolizumab (ENTYVIO IV) Inject into the vein.    [provider]  warfarin (JANTOVEN) 6 MG tablet Take 6 mg by mouth daily.    [provider]    Allergies    Patient has no known allergies.  Review of Systems   Review of Systems  Constitutional: Negative for chills and fever.  HENT: Negative for congestion.   Eyes: Negative for pain.  Respiratory: Negative for cough and shortness of breath.   Cardiovascular: Negative for chest pain and leg swelling.  Gastrointestinal: Negative for abdominal pain and vomiting.  Genitourinary: Negative for dysuria.  Musculoskeletal: Negative for myalgias.       Right hip pain, right thigh pain  Skin: Negative for rash.  Neurological: Positive for numbness. Negative for dizziness and headaches.       Numbness of the right anterior thigh    Physical Exam Updated Vital Signs BP (!) 155/79   Pulse 82    Temp 98.3 F (36.8 C) (Oral)   Resp 19   SpO2 96%   Physical Exam Vitals and nursing note reviewed.  Constitutional:      General: He is in acute distress.     Appearance: Normal appearance. He is not ill-appearing.     Comments: Patient is extremely pleasant 79 year old gentleman appears stated age.  He is in severe pain  HENT:     Head: Normocephalic and atraumatic.     Mouth/Throat:     Mouth: Mucous membranes  are moist.  Eyes:     General: No scleral icterus.       Right eye: No discharge.        Left eye: No discharge.     Conjunctiva/sclera: Conjunctivae normal.  Cardiovascular:     Rate and Rhythm: Normal rate.     Pulses: Normal pulses.     Comments: Bilateral DP and PT pulses are 3+ and symmetric Pulmonary:     Effort: Pulmonary effort is normal.     Breath sounds: Normal breath sounds. No stridor. No wheezing.  Abdominal:     Tenderness: There is abdominal tenderness. There is guarding. There is no right CVA tenderness, left CVA tenderness or rebound.     Comments: Significant tenderness to palpation of the right lower quadrant.  There is voluntary guarding no involuntary guarding.  Some tenderness with percussion.  No rebound tenderness.  No palpable masses.  Musculoskeletal:     Comments: No tenderness to palpation of the right hip or thigh, knee calf or ankle.  No lower extremity edema.  Unable to range the right hip secondary to severe pain.  Very minimal range of motion of right knee and right ankle secondary to any movement in the right leg causing severe hip pain.  No midline back tenderness to palpation.  Skin:    General: Skin is warm and dry.     Capillary Refill: Capillary refill takes less than 2 seconds.     Comments: No redness, erythema or unusual warmth to touch of the skin of the lower extremities bilaterally.  No rashes.  Neurological:     Mental Status: He is alert and oriented to person, place, and time. Mental status is at baseline.      Comments: Some decreased but not completely absent sensation of the right anterior thigh.  Sensation intact in groin area.  No objective saddle anesthesia.     ED Results / Procedures / Treatments   Labs (all labs ordered are listed, but only abnormal results are displayed) Labs Reviewed  CBC WITH DIFFERENTIAL/PLATELET - Abnormal; Notable for the following components:      Result Value   RBC 4.21 (*)    Hemoglobin 12.9 (*)    HCT 37.7 (*)    All other components within normal limits  COMPREHENSIVE METABOLIC PANEL - Abnormal; Notable for the following components:   CO2 21 (*)    Glucose, Bld 120 (*)    Calcium 8.8 (*)    All other components within normal limits  LIPASE, BLOOD  PROTIME-INR    EKG None  Radiology VAS [place] LOWER EXTREMITY VENOUS (DVT) (ONLY [facility] & [facility])  Result Date: 07/03/2020  Lower Venous DVTStudy Indications: Pain in right thigh.  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 Comparison Study: 04-10-2013 LT lower extremity study available. Performing Technologist: Darlin Coco  Examination Guidelines: A complete evaluation includes B-mode imaging, spectral Doppler, color Doppler, and power Doppler as needed of all accessible portions of each vessel. Bilateral testing is considered an integral part of a complete examination. Limited examinations for reoccurring indications may be performed as noted. The reflux portion of the exam is performed with the patient in reverse Trendelenburg.  +---------+---------------+---------+-----------+----------+------------------+ RIGHT    CompressibilityPhasicitySpontaneityPropertiesThrombus Aging     +---------+---------------+---------+-----------+----------+------------------+ CFV      Full           Yes      Yes                                     +---------+---------------+---------+-----------+----------+------------------+  SFJ      Full                                                             +---------+---------------+---------+-----------+----------+------------------+ FV Prox  Full                                                            +---------+---------------+---------+-----------+----------+------------------+ FV Mid   Full                                                            +---------+---------------+---------+-----------+----------+------------------+ FV DistalFull                                                            +---------+---------------+---------+-----------+----------+------------------+ PFV      Full                                                            +---------+---------------+---------+-----------+----------+------------------+ POP      Full           Yes      Yes                  Fibrin stranding                                                         noted              +---------+---------------+---------+-----------+----------+------------------+ PTV      Full                                                            +---------+---------------+---------+-----------+----------+------------------+ PERO     Full                                                            +---------+---------------+---------+-----------+----------+------------------+   +----+---------------+---------+-----------+----------+--------------+ LEFTCompressibilityPhasicitySpontaneityPropertiesThrombus Aging +----+---------------+---------+-----------+----------+--------------+ CFV Full           Yes      Yes                                 +----+---------------+---------+-----------+----------+--------------+  Summary: RIGHT: - There is no evidence of deep vein thrombosis in the lower extremity.  - No cystic structure found in the popliteal fossa.  LEFT: - No evidence of common femoral vein obstruction.  *See table(s) above for measurements and observations.    Preliminary     Procedures Procedures (including  critical care time)  Medications Ordered in ED Medications  HYDROmorphone (DILAUDID) injection 0.5 mg (0.5 mg Intravenous Given 07/03/20 2115)  HYDROmorphone (DILAUDID) injection 0.5 mg (0.5 mg Intravenous Given 07/03/20 2210)  iohexol (OMNIPAQUE) 300 MG/ML solution 100 mL (100 mLs Intravenous Contrast Given 07/03/20 2213)    ED Course  I have reviewed the triage vital signs and the nursing notes.  Pertinent labs & imaging results that were available during my care of the patient were reviewed by me and considered in my medical decision making (see chart for details).    MDM Rules/Calculators/A&P                          Patient is 79 year old gentleman with past medical history detailed above presented today for severe right hip pain.  My exam he is not able to range his right hip at all.  He does not have any significant tenderness palpation of the right hip or knee or right lower extremity at all but does seem to have some right lower quadrant abdominal tenderness.  He does not have any step-off or deformity or tenderness of the midline spine.  My primary concerns are for intra-abdominal pathology given the right lower quadrant abdominal tenderness and voluntary guarding.  He was given 2 doses of Dilaudid incrementally which only brought his pain to a manageable level. Given that he is on a blood thinner I have low suspicion for DVT however he had DVT ultrasound done in triage that was ordered by nursing staff.  This is negative.  I will obtain CT imaging of abdomen pelvis and lumbar spine.  I have a moderate suspicion for a psoas abscess, appendicitis, or hematoma given his numbness in his anterior thigh which may be caused by some mass-effect of abscess or hematoma.  He is on immunosuppressive medicine which may suppress a leukocytosis.  CT imaging will better evaluate this.  Lower suspicion for a sigmoid diverticulitis and I have low suspicion for his pain coming from his back or a AAA or  thoracic aortic dissection.  Patient understands that CT scans will be followed up on by oncoming team.  CMP relatively unremarkable with no significant electrolyte derangements.  Very mild low calcium.  CBC without leukocytosis no significant anemia.  Lipase within normal limits doubt pancreatitis.  INR added on by Dr. Maryan Rued  10:59 PM patient care handed off to [facility] who will follow up on CT imaging and remaining blood work and disposition appropriately.  If CT scans are negative anticipate patient may be discharged home with pain control if pain is adequately managed.  Final Clinical Impression(s) / ED Diagnoses Final diagnoses:  Back pain  Right leg pain  Right hip pain  RLQ abdominal pain    Rx / DC Orders ED Discharge Orders    None       Tedd Sias, [place] 07/03/20 2303    Blanchie Dessert, MD 07/04/20 1507

## 2020-07-03 NOTE — ED Notes (Signed)
Patient transported to CT 

## 2020-07-03 NOTE — ED Triage Notes (Signed)
Per EMS-states was seen at [facility] yesterday for same symptoms-states right upper leg pain-prescribed meds not helping-history of DVT

## 2020-07-03 NOTE — ED Provider Notes (Signed)
22:15: Assumed care of patient from [facility] PA-C at change of shift pending CT imaging and disposition.  Please see prior provider note for full H&P.  Briefly patient is a 79 year old male with a history of hypertension, hyperlipidemia, ulcerative colitis on immunosuppressive therapy, and chronic anticoagulation on warfarin who presented to the emergency department with complaints of right hip pain over the past 5 days.  No recent traumatic injury.  Physical Exam  BP (!) 155/79    Pulse 82    Temp 98.3 F (36.8 C) (Oral)    Resp 19    SpO2 96%   Physical Exam Vitals and nursing note reviewed.  Constitutional:      General: He is not in acute distress.    Appearance: He is well-developed.  HENT:     Head: Normocephalic and atraumatic.  Eyes:     General:        Right eye: No discharge.        Left eye: No discharge.     Conjunctiva/sclera: Conjunctivae normal.  Abdominal:     Tenderness: There is abdominal tenderness (Right lower quadrant of the abdomen.). There is guarding.  Musculoskeletal:     Comments: Tenderness to palpation to the right anterior hip as well as the proximal one third of the right anterior thigh.  Neurological:     Mental Status: He is alert.     Comments: Clear speech.   Psychiatric:        Behavior: Behavior normal.        Thought Content: Thought content normal.    ED Course/Procedures     Results for orders placed or performed during the hospital encounter of 07/03/20  CBC with Differential  Result Value Ref Range   WBC 8.6 4.0 - 10.5 K/uL   RBC 4.21 (L) 4.22 - 5.81 MIL/uL   Hemoglobin 12.9 (L) 13.0 - 17.0 g/dL   HCT 37.7 (L) 39 - 52 %   MCV 89.5 80.0 - 100.0 fL   MCH 30.6 26.0 - 34.0 pg   MCHC 34.2 30.0 - 36.0 g/dL   RDW 13.9 11.5 - 15.5 %   Platelets 215 150 - 400 K/uL   nRBC 0.0 0.0 - 0.2 %   Neutrophils Relative % 71 %   Neutro Abs 6.2 1.7 - 7.7 K/uL   Lymphocytes Relative 17 %   Lymphs Abs 1.5 0.7 - 4.0 K/uL   Monocytes Relative 9  %   Monocytes Absolute 0.7 0 - 1 K/uL   Eosinophils Relative 2 %   Eosinophils Absolute 0.1 0 - 0 K/uL   Basophils Relative 1 %   Basophils Absolute 0.0 0 - 0 K/uL   Immature Granulocytes 0 %   Abs Immature Granulocytes 0.02 0.00 - 0.07 K/uL  Comprehensive metabolic panel  Result Value Ref Range   Sodium 139 135 - 145 mmol/L   Potassium 3.5 3.5 - 5.1 mmol/L   Chloride 105 98 - 111 mmol/L   CO2 21 (L) 22 - 32 mmol/L   Glucose, Bld 120 (H) 70 - 99 mg/dL   BUN 19 8 - 23 mg/dL   Creatinine, Ser 0.84 0.61 - 1.24 mg/dL   Calcium 8.8 (L) 8.9 - 10.3 mg/dL   Total Protein 6.8 6.5 - 8.1 g/dL   Albumin 4.1 3.5 - 5.0 g/dL   AST 18 15 - 41 U/L   ALT 16 0 - 44 U/L   Alkaline Phosphatase 55 38 - 126 U/L   Total Bilirubin 0.9  0.3 - 1.2 mg/dL   GFR calc non Af Amer >60 >60 mL/min   GFR calc Af Amer >60 >60 mL/min   Anion gap 13 5 - 15  Lipase, blood  Result Value Ref Range   Lipase 27 11 - 51 U/L  Protime-INR  Result Value Ref Range   Prothrombin Time 44.8 (H) 11.4 - 15.2 seconds   INR 5.0 (HH) 0.8 - 1.2   CT Abdomen Pelvis W Contrast  Result Date: 07/03/2020 CLINICAL DATA:  Right lower quadrant abdominal pain EXAM: CT ABDOMEN AND PELVIS WITH CONTRAST TECHNIQUE: Multidetector CT imaging of the abdomen and pelvis was performed using the standard protocol following bolus administration of intravenous contrast. CONTRAST:  163mL OMNIPAQUE IOHEXOL 300 MG/ML  SOLN COMPARISON:  Contemporary lumbar CT reconstruction, MR lumbar spine 04/03/2013 FINDINGS: Lower chest: Bandlike areas of opacity in the lung bases, likely atelectasis or scarring. No consolidation or effusion is seen. Normal heart size. No pericardial effusion. Hepatobiliary: No worrisome focal liver lesions. Smooth liver surface contour. Normal hepatic attenuation. Physiologic distension of the gallbladder. No calcified gallstones or pericholecystic inflammation. No biliary ductal dilatation. Pancreas: Partial fatty replacement of the  pancreas. No pancreatic ductal dilatation or surrounding inflammatory changes. Spleen: Normal in size. No concerning splenic lesions. Adrenals/Urinary Tract: Normal adrenal glands. Kidneys are normally located with symmetric enhancement and excretion. No suspicious renal lesion, urolithiasis or hydronephrosis. Moderate distention of the urinary bladder without other gross abnormality. Stomach/Bowel: Small sliding-type hiatal hernia. Distal stomach is unremarkable. Intermediate attenuation fluid along the duodenal sweep may be redistributed hemorrhage within the retroperitoneum. No focal bowel wall and abnormality in this vicinity. No extraluminal air. No small bowel thickening or dilatation. No colonic dilatation or wall thickening. Trace free fluid is seen in the right paracolic gutter, possibly redistributed as well. A normal appendix is visualized. No colonic dilatation or wall thickening. Vascular/Lymphatic: There are multiple serpiginous blushes of hyperattenuation within the right iliacus musculature concerning for sites of active contrast extravasation. No other sites concerning for active contrast extravasation. No direct vascular injury is identified. Atherosclerotic calcifications within the abdominal aorta and branch vessels. No aneurysm or ectasia. No enlarged abdominopelvic lymph nodes. Reproductive: Prostate appears borderline enlarged with heterogeneous calcifications though largely obscured by streak artifact from bilateral hip prostheses. Trace right hydrocele is noted. No other acute abnormality of the external genitalia. Other: There is hemorrhage and fluid predominantly throughout the right retroperitoneum which likely arises from the sites of active extravasation in the right iliacus as detailed above. This fluid in hemorrhage does appear to redistributed into the posterior and anterior pararenal space as well as about the duodenum as denoted above. Musculoskeletal: There sites of contrast blush  within the right iliacus muscle with additional heterogeneous hemorrhage along the psoas more proximally as well as what is likely a layering intramuscular hemorrhage of the more inferior iliopsoas muscle though partially obscured by adjacent streak artifact from the right hip prosthesis, series 2, image 87. 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 And acetabular component fixation screw in the right stands proud of the superior acetabular cortex (6/113) similarly, a fixation screw in the left closely approximates the cortex along the left ilium (6/100). No other acute or worrisome abnormalities of bony pelvis. Dedicated lumbar reconstructions are generated and dictated separately. Please see report for further details. In brief, there appears to be multilevel discogenic and facet degenerative changes as well as several small vertebral body hemangiomata but without acute osseous injury or suspicious osseous lesion. IMPRESSION: 1.  Multiple serpiginous blushes of hyperattenuation within the right iliacus muscle concerning for sites of active contrast extravasation with extensive thickening and intramuscular hemorrhage. Layering intramuscular hematoma is seen within the iliopsoas muscle of the anterior proximal thigh as well as additional retroperitoneal hemorrhage tracking superiorly along the psoas and distributed throughout the right retroperitoneal spaces. 2. Hyperdense hemorrhage is present in the retroperitoneum along the duodenal sweep though this is favored to be redistributed without acute abnormality of the bowel or extraluminal gas. 3. Trace intraperitoneal fluid/hemorrhage in the right paracolic gutter and deep pelvis, possibly redistributed as well. 4. No other acute abnormality in the abdomen or pelvis. Specifically, the appendix is normal. 5. 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 6. Dedicated lumbar reconstructions are  generated and dictated separately. 7. Aortic Atherosclerosis (ICD10-I70.0). These results were called by telephone at the time of interpretation on 07/03/2020 at 10:56 pm to provider Dr Maryan Rued, who verbally acknowledged these results. Electronically Signed   By: Lovena Le M.D.   On: 07/03/2020 22:57   CT L-SPINE NO CHARGE  Result Date: 07/03/2020 CLINICAL DATA:  Right upper leg pain EXAM: CT Lumbar spine without contrast TECHNIQUE: Multiplanar CT images of the lumbar spine were reconstructed from contemporary CT of the Chest, Abdomen, and Pelvis COMPARISON:  Contemporary CT abdomen pelvis, MR lumbar spine 04/03/2013, radiographs 04/08/2013 FINDINGS: CT LUMBAR SPINE FINDINGS Segmentation: 5 normally formed lumbar type vertebral levels. Lowest fully formed disc space denoted as L5-S1. Partial sacralization of the L5 transverse processes is noted incidentally, benign variant. Alignment: Straightening of the normal lumbar lordosis. No significant spondylolisthesis or spondylolysis. No abnormally widened, perched or jumped facets. Vertebrae: No acute fracture or vertebral body height loss is evident. Benign vertebral body hemangiomata are present in the T12, L2 and L3 levels, seen on comparison MRI. No worrisome lytic or blastic lesions are seen. Some fairly prominent Schmorl's node formations are noted in the anterior inferior endplate L4, at least several of which are new from comparison in 2014. 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 Some predominantly anterior endplate spurring is noted. Multilevel discogenic and facet degenerative changes are present, further detailed below. 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 Paraspinal and other soft tissues: No perispinal fluid, swelling, gas or hemorrhage. No visible canal hematoma. Retroperitoneal hemorrhage centered within the right iliacus at a site of active contrast extravasation is  better detailed on contemporary CT of the abdomen and pelvis, dictated separately. Aortoiliac atherosclerosis noted as well. For additional findings in the abdomen and pelvis, please see dedicated CT from which this study is reconstructed. Disc levels: Level by level evaluation of the lumbar spine below: T11-T12: Mild disc height loss without posterior disc abnormality. Mild bilateral facet arthropathy. No significant spinal canal or foraminal stenosis. T12-L1: Mild disc height loss without significant posterior disc abnormality. Mild bilateral facet arthropathy. No significant spinal canal or foraminal stenosis. L1-L2: Mild to moderate disc height loss with global disc bulge and mild bilateral facet arthropathy. At most mild canal stenosis and partial effacement of the lateral recesses. No significant foraminal narrowing. L2-L3: Moderate disc height loss with asymmetric global disc bulge eccentric to the left subarticular zone with moderate bilateral facet arthropathy. Mild canal stenosis and near complete effacement of the left lateral recess. No significant foraminal impingement. L3-L4: Moderate disc height loss with global disc bulge and a superimposed inferiorly directed disc extrusion extending 6 mm below the disc space. Mild canal stenosis  and some asymmetric effacement of the right lateral recess. No significant foraminal impingement. L4-L5: Moderate to severe disc height loss with disc desiccation and vacuum disc phenomenon as well as a global disc bulge asymmetric to the right subarticular zone with central disc protrusion and moderate to severe bilateral facet arthropathy resulting and some moderate canal stenosis, severe lateral recess narrowing, and moderate bilateral foraminal narrowing, left slightly greater than right. L5-S1: Severe disc height loss, disc desiccation and vacuum disc with Modic type endplate changes as well as a global disc bulge and severe bilateral facet arthropathy. Findings result  in some mild canal stenosis and effacement of the lateral recesses as well as moderate to severe bilateral foraminal narrowing. IMPRESSION: 1. No acute fracture or vertebral body height loss identified. 2. Benign vertebral body hemangiomata T12, L2 and L3. 3. Multilevel degenerative changes of the lumbar spine as described level by level above. Features maximal L4-S1. Stenotic changes are likely accentuated by what appear to be congenitally shortened pedicles. 4. Interspinous arthrosis compatible with Baastrup's disease. 5. Retroperitoneal hemorrhage centered within the right iliacus in distributed throughout the retroperitoneum better detailed on dedicated CT from which this study is reconstructed. Electronically Signed   By: Lovena Le M.D.   On: 07/03/2020 23:11   VAS [place] LOWER EXTREMITY VENOUS (DVT) (ONLY [facility] & [facility])  Result Date: 07/03/2020  Lower Venous DVTStudy Indications: Pain in right thigh.  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 Comparison Study: 04-10-2013 LT lower extremity study available. Performing Technologist: Darlin Coco  Examination Guidelines: A complete evaluation includes B-mode imaging, spectral Doppler, color Doppler, and power Doppler as needed of all accessible portions of each vessel. Bilateral testing is considered an integral part of a complete examination. Limited examinations for reoccurring indications may be performed as noted. The reflux portion of the exam is performed with the patient in reverse Trendelenburg.  +---------+---------------+---------+-----------+----------+------------------+  RIGHT     Compressibility Phasicity Spontaneity Properties Thrombus Aging      +---------+---------------+---------+-----------+----------+------------------+  CFV       Full            Yes       Yes                                        +---------+---------------+---------+-----------+----------+------------------+  SFJ       Full                                                                  +---------+---------------+---------+-----------+----------+------------------+  FV Prox   Full                                                                 +---------+---------------+---------+-----------+----------+------------------+  FV Mid    Full                                                                 +---------+---------------+---------+-----------+----------+------------------+  FV Distal Full                                                                 +---------+---------------+---------+-----------+----------+------------------+  PFV       Full                                                                 +---------+---------------+---------+-----------+----------+------------------+  POP       Full            Yes       Yes                    Fibrin stranding                                                                noted               +---------+---------------+---------+-----------+----------+------------------+  PTV       Full                                                                 +---------+---------------+---------+-----------+----------+------------------+  PERO      Full                                                                 +---------+---------------+---------+-----------+----------+------------------+   +----+---------------+---------+-----------+----------+--------------+  LEFT Compressibility Phasicity Spontaneity Properties Thrombus Aging  +----+---------------+---------+-----------+----------+--------------+  CFV  Full            Yes       Yes                                    +----+---------------+---------+-----------+----------+--------------+     Summary: RIGHT: - There is no evidence of deep vein thrombosis in the lower extremity.  - No cystic structure found in the popliteal fossa.  LEFT: - No evidence of common femoral vein obstruction.  *See table(s) above for measurements and observations.    Preliminary     .Critical  Care Performed by: Amaryllis Dyke, PA-C Authorized by: Amaryllis Dyke, PA-C     CRITICAL CARE Performed by: Kennith Maes   Total critical care time: 35 minutes  Critical care time was exclusive of separately billable procedures and treating other patients.  Critical care was necessary to treat or prevent  imminent or life-threatening deterioration.  Critical care was time spent personally by me on the following activities: development of treatment plan with patient and/or surrogate as well as nursing, discussions with consultants, evaluation of patient's response to treatment, examination of patient, obtaining history from patient or surrogate, ordering and performing treatments and interventions, ordering and review of laboratory studies, ordering and review of radiographic studies, pulse oximetry and re-evaluation of patient's condition.  MDM   CT imaging with right iliacus muscle hematoma and active extravasation with extension into the retroperitoneum--> PT/INR pending at this time.  Will likely need reversal.   22:40: On reassessment of the patient he is complaining of continued pain, did have some mild relief with Dilaudid, additional analgesics ordered, updated on results & plan of care thus far.   Discussed with lab, INR critically elevated at 5.0.  Kcentra and vitamin K ordered.   00:20: CONSULT: Discussed w/ hospitalist Dr. Alcario Drought- accepts admission.       Amaryllis Dyke, PA-C 07/04/20 0040    Sherwood Gambler, MD 07/04/20 0120

## 2020-07-03 NOTE — Progress Notes (Signed)
Lower extremity venous RT study completed.  Preliminary results relayed to MD.   See CV Proc for preliminary results report.   Braylon Grenda, RDMS  

## 2020-07-04 ENCOUNTER — Encounter (HOSPITAL_COMMUNITY): Payer: Self-pay | Admitting: Internal Medicine

## 2020-07-04 DIAGNOSIS — K5100 Ulcerative (chronic) pancolitis without complications: Secondary | ICD-10-CM

## 2020-07-04 DIAGNOSIS — Z79899 Other long term (current) drug therapy: Secondary | ICD-10-CM | POA: Diagnosis not present

## 2020-07-04 DIAGNOSIS — Z823 Family history of stroke: Secondary | ICD-10-CM | POA: Diagnosis not present

## 2020-07-04 DIAGNOSIS — I10 Essential (primary) hypertension: Secondary | ICD-10-CM | POA: Diagnosis present

## 2020-07-04 DIAGNOSIS — R58 Hemorrhage, not elsewhere classified: Secondary | ICD-10-CM | POA: Diagnosis present

## 2020-07-04 DIAGNOSIS — Z20822 Contact with and (suspected) exposure to covid-19: Secondary | ICD-10-CM | POA: Diagnosis present

## 2020-07-04 DIAGNOSIS — Z8371 Family history of colonic polyps: Secondary | ICD-10-CM | POA: Diagnosis not present

## 2020-07-04 DIAGNOSIS — S36892A Contusion of other intra-abdominal organs, initial encounter: Secondary | ICD-10-CM | POA: Diagnosis not present

## 2020-07-04 DIAGNOSIS — Z800 Family history of malignant neoplasm of digestive organs: Secondary | ICD-10-CM | POA: Diagnosis not present

## 2020-07-04 DIAGNOSIS — K661 Hemoperitoneum: Secondary | ICD-10-CM | POA: Diagnosis present

## 2020-07-04 DIAGNOSIS — Y929 Unspecified place or not applicable: Secondary | ICD-10-CM | POA: Diagnosis not present

## 2020-07-04 DIAGNOSIS — Z86718 Personal history of other venous thrombosis and embolism: Secondary | ICD-10-CM | POA: Diagnosis not present

## 2020-07-04 DIAGNOSIS — S301XXA Contusion of abdominal wall, initial encounter: Secondary | ICD-10-CM | POA: Diagnosis present

## 2020-07-04 DIAGNOSIS — E785 Hyperlipidemia, unspecified: Secondary | ICD-10-CM | POA: Diagnosis present

## 2020-07-04 DIAGNOSIS — Z8261 Family history of arthritis: Secondary | ICD-10-CM | POA: Diagnosis not present

## 2020-07-04 DIAGNOSIS — Z86711 Personal history of pulmonary embolism: Secondary | ICD-10-CM

## 2020-07-04 DIAGNOSIS — Z981 Arthrodesis status: Secondary | ICD-10-CM | POA: Diagnosis not present

## 2020-07-04 DIAGNOSIS — D6832 Hemorrhagic disorder due to extrinsic circulating anticoagulants: Secondary | ICD-10-CM | POA: Diagnosis present

## 2020-07-04 DIAGNOSIS — T45515A Adverse effect of anticoagulants, initial encounter: Secondary | ICD-10-CM | POA: Diagnosis present

## 2020-07-04 DIAGNOSIS — Z8551 Personal history of malignant neoplasm of bladder: Secondary | ICD-10-CM | POA: Diagnosis not present

## 2020-07-04 DIAGNOSIS — Z86010 Personal history of colonic polyps: Secondary | ICD-10-CM | POA: Diagnosis not present

## 2020-07-04 DIAGNOSIS — Z7901 Long term (current) use of anticoagulants: Secondary | ICD-10-CM | POA: Diagnosis not present

## 2020-07-04 DIAGNOSIS — T80818A Extravasation of other vesicant agent, initial encounter: Secondary | ICD-10-CM | POA: Diagnosis present

## 2020-07-04 DIAGNOSIS — M549 Dorsalgia, unspecified: Secondary | ICD-10-CM | POA: Diagnosis present

## 2020-07-04 DIAGNOSIS — Z8249 Family history of ischemic heart disease and other diseases of the circulatory system: Secondary | ICD-10-CM | POA: Diagnosis not present

## 2020-07-04 DIAGNOSIS — Z87891 Personal history of nicotine dependence: Secondary | ICD-10-CM | POA: Diagnosis not present

## 2020-07-04 DIAGNOSIS — Z96643 Presence of artificial hip joint, bilateral: Secondary | ICD-10-CM | POA: Diagnosis present

## 2020-07-04 DIAGNOSIS — F419 Anxiety disorder, unspecified: Secondary | ICD-10-CM | POA: Diagnosis present

## 2020-07-04 LAB — PROTIME-INR
INR: 5 (ref 0.8–1.2)
INR: 1.6 — ABNORMAL HIGH (ref 0.8–1.2)
INR: 1.2 (ref 0.8–1.2)
INR: 1.3 — ABNORMAL HIGH (ref 0.8–1.2)
Prothrombin Time: 44.8 seconds — ABNORMAL HIGH (ref 11.4–15.2)
Prothrombin Time: 18.5 seconds — ABNORMAL HIGH (ref 11.4–15.2)
Prothrombin Time: 14.9 seconds (ref 11.4–15.2)
Prothrombin Time: 15.4 seconds — ABNORMAL HIGH (ref 11.4–15.2)

## 2020-07-04 LAB — CBC
HCT: 35.4 % — ABNORMAL LOW (ref 39.0–52.0)
Hemoglobin: 12 g/dL — ABNORMAL LOW (ref 13.0–17.0)
MCH: 31.3 pg (ref 26.0–34.0)
MCHC: 33.9 g/dL (ref 30.0–36.0)
MCV: 92.4 fL (ref 80.0–100.0)
Platelets: 185 10*3/uL (ref 150–400)
RBC: 3.83 MIL/uL — ABNORMAL LOW (ref 4.22–5.81)
RDW: 14.3 % (ref 11.5–15.5)
WBC: 8 10*3/uL (ref 4.0–10.5)
nRBC: 0 % (ref 0.0–0.2)

## 2020-07-04 LAB — BASIC METABOLIC PANEL
Anion gap: 10 (ref 5–15)
BUN: 17 mg/dL (ref 8–23)
CO2: 23 mmol/L (ref 22–32)
Calcium: 8.1 mg/dL — ABNORMAL LOW (ref 8.9–10.3)
Chloride: 104 mmol/L (ref 98–111)
Creatinine, Ser: 0.82 mg/dL (ref 0.61–1.24)
GFR calc Af Amer: >60 mL/min (ref >60)
GFR calc non Af Amer: >60 mL/min (ref >60)
Glucose, Bld: 110 mg/dL — ABNORMAL HIGH (ref 70–99)
Potassium: 3.6 mmol/L (ref 3.5–5.1)
Sodium: 137 mmol/L (ref 135–145)

## 2020-07-04 LAB — RESPIRATORY PANEL BY RT PCR (FLU A&B, COVID)
Influenza A by PCR: NEGATIVE
Influenza B by PCR: NEGATIVE
SARS Coronavirus 2 by RT PCR: NEGATIVE

## 2020-07-04 MED ORDER — ONDANSETRON HCL 4 MG PO TABS: 4.0000 mg | ORAL_TABLET | Freq: Four times a day (QID) | ORAL | Status: DC | PRN

## 2020-07-04 MED ORDER — PROTHROMBIN COMPLEX CONC HUMAN 500 UNITS IV KIT
1565.0000 [IU] | PACK | Status: AC
  Administered 2020-07-04: 1565 [IU] via INTRAVENOUS
  Filled 2020-07-04 (×2): qty 1565

## 2020-07-04 MED ORDER — HYDROMORPHONE HCL 1 MG/ML IJ SOLN: 0.5000 mg | INTRAMUSCULAR | Status: DC | PRN

## 2020-07-04 MED ORDER — BUDESONIDE 3 MG PO CPEP
9.0000 mg | ORAL_CAPSULE | Freq: Two times a day (BID) | ORAL | Status: DC
  Administered 2020-07-04 – 2020-07-07 (×7): 9 mg via ORAL
  Filled 2020-07-04 (×7): qty 3

## 2020-07-04 MED ORDER — ACETAMINOPHEN 325 MG PO TABS
650.0000 mg | ORAL_TABLET | Freq: Four times a day (QID) | ORAL | Status: DC | PRN
  Administered 2020-07-07: 650 mg via ORAL
  Filled 2020-07-04: qty 2

## 2020-07-04 MED ORDER — EZETIMIBE 10 MG PO TABS
10.0000 mg | ORAL_TABLET | Freq: Every day | ORAL | Status: DC
  Administered 2020-07-04 – 2020-07-07 (×4): 10 mg via ORAL
  Filled 2020-07-04 (×4): qty 1

## 2020-07-04 MED ORDER — HYDROMORPHONE HCL 1 MG/ML IJ SOLN
0.5000 mg | INTRAMUSCULAR | Status: DC | PRN
  Administered 2020-07-04 – 2020-07-07 (×15): 1 mg via INTRAVENOUS
  Filled 2020-07-04 (×15): qty 1

## 2020-07-04 MED ORDER — ACETAMINOPHEN 650 MG RE SUPP: 650.0000 mg | Freq: Four times a day (QID) | RECTAL | Status: DC | PRN

## 2020-07-04 MED ORDER — VITAMIN K1 10 MG/ML IJ SOLN
10.0000 mg | INTRAVENOUS | Status: AC
  Administered 2020-07-04: 10 mg via INTRAVENOUS
  Filled 2020-07-04: qty 1

## 2020-07-04 MED ORDER — SERTRALINE HCL 100 MG PO TABS
200.0000 mg | ORAL_TABLET | Freq: Every day | ORAL | Status: DC
  Administered 2020-07-04 – 2020-07-07 (×4): 200 mg via ORAL
  Filled 2020-07-04 (×5): qty 2

## 2020-07-04 MED ORDER — ONDANSETRON HCL 4 MG/2ML IJ SOLN: 4.0000 mg | Freq: Four times a day (QID) | INTRAMUSCULAR | Status: DC | PRN

## 2020-07-04 MED ORDER — SIMVASTATIN 40 MG PO TABS
40.0000 mg | ORAL_TABLET | Freq: Every day | ORAL | Status: DC
  Administered 2020-07-04 – 2020-07-07 (×4): 40 mg via ORAL
  Filled 2020-07-04 (×4): qty 1

## 2020-07-04 NOTE — Progress Notes (Signed)
Pt with RP hematoma in the setting of supratherapeutic INR, now reversed. Currently H/H is stable.  No current emergent surgical plans.  Will follow.  Full consult to follow.

## 2020-07-04 NOTE — Plan of Care (Signed)
  Problem: Education: Goal: Knowledge of General Education information will improve Description: Including pain rating scale, medication(s)/side effects and non-pharmacologic comfort measures Outcome: Progressing   Problem: Clinical Measurements: Goal: Diagnostic test results will improve Outcome: Progressing   

## 2020-07-04 NOTE — Progress Notes (Signed)
PROGRESS NOTE    Paul Luna   VHQ:[id number]  DOB: 08-10-1941  DOA: 07/03/2020     0  PCP: Lilian Coma., MD  CC: right thigh pain   Hospital Course: Paul Luna is a 79 yo CM with PMH DVT/PE on chronic Coumadin (since 2010), UC, HTN, arthritis who presented with right thigh pain up to his groin.  He was seen at urgent care with similar complaints on 07/02/2020 and discharged home with pain prescription.  He returned for ongoing symptoms.  He underwent right lower extremity duplex on admission which was negative for DVT. He then underwent CT abdomen/pelvis which revealed right iliacus muscle noted with thickening and intramuscular hemorrhage as well as hematoma seen within the iliopsoas muscle and retroperitoneal hemorrhage tracking along the psoas and distributed throughout the right retroperitoneal spaces.  There was also trace intraperitoneal fluid/hemorrhage in the right paracolic gutter/deep pelvis.  His INR on admission was 5 and he underwent reversal with [facility] and vitamin K 10 mg x 1.  Repeat INR was 1.6 and remained reversed with trending.  He was hemodynamically stable.  Hemoglobin was also trended on admission.  Due to his CT findings, surgery was also consulted for further recommendations. Hematology was consulted regarding further recommendations regarding resuming AC vs alternative drug choices if needed as well.    Interval History:  Patient admitted overnight with ongoing right lower extremity pain at home.  He does endorse feeling a little better this morning but is still quite painful in his right thigh.  We reviewed his clotting history and he states his INR was never this high prior to this hospitalization.  This is his first significant bleeding episode he says.  Old records reviewed in assessment of this patient  ROS: Constitutional: negative for chills and fevers, Respiratory: negative for cough, Cardiovascular: negative for chest pain and Gastrointestinal: negative  for abdominal pain  Assessment & Plan: * Psoas hematoma, right, secondary to anticoagulant therapy, initial encounter - Patient has pain extending down into right tight. Currently no s/s compartment syndrome - continue to monitor for any developing signs of compartment syndrome in RLE  Retroperitoneal hematoma - CT abdomen/pelvis revealed right iliacus muscle noted with thickening and intramuscular hemorrhage as well as hematoma seen within the iliopsoas muscle and retroperitoneal hemorrhage tracking along the psoas and distributed throughout the right retroperitoneal spaces.  There was also trace intraperitoneal fluid/hemorrhage in the right paracolic gutter/deep pelvis. 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 INR has been reversed with [place] and Vit K on admission     Antimicrobials: none  DVT prophylaxis: SCD Code Status: full Family Communication: none present Disposition Plan: Status is: Observation  The patient remains OBS appropriate and will d/c before 2 midnights.  Dispo: The patient is from: Home              Anticipated d/c is to: Home              Anticipated d/c date is: 1 day              Patient currently is not medically stable to  d/c.       Objective: Blood pressure 113/63, pulse 84, temperature (!) 100.8 F (38.2 C), temperature source Oral, resp. rate 14, SpO2 95 %.  Examination: General appearance: alert, cooperative and no distress Head: Normocephalic, without obvious abnormality, atraumatic Eyes: EOMI Lungs: clear to  auscultation bilaterally Heart: regular rate and rhythm and S1, S2 normal Abdomen: normal findings: bowel sounds normal and soft, non-tender Extremities: RLE is TTP worst in thigh; compartment is soft with no signs of compartment. Skin: mobility and turgor normal Neurologic: some paresthesia noted in RLE, no true weakness and no obvious focal deficits  Consultants:   Surgery  Hematology  Procedures:   none  Data Reviewed: I have personally reviewed following labs and imaging studies Results for orders placed or performed during the hospital encounter of 07/03/20 (from the past 24 hour(s))  CBC with Differential     Status: Abnormal   Collection Time: 07/03/20  8:29 PM  Result Value Ref Range   WBC 8.6 4.0 - 10.5 K/uL   RBC 4.21 (L) 4.22 - 5.81 MIL/uL   Hemoglobin 12.9 (L) 13.0 - 17.0 g/dL   HCT 37.7 (L) 39 - 52 %   MCV 89.5 80.0 - 100.0 fL   MCH 30.6 26.0 - 34.0 pg   MCHC 34.2 30.0 - 36.0 g/dL   RDW 13.9 11.5 - 15.5 %   Platelets 215 150 - 400 K/uL   nRBC 0.0 0.0 - 0.2 %   Neutrophils Relative % 71 %   Neutro Abs 6.2 1.7 - 7.7 K/uL   Lymphocytes Relative 17 %   Lymphs Abs 1.5 0.7 - 4.0 K/uL   Monocytes Relative 9 %   Monocytes Absolute 0.7 0 - 1 K/uL   Eosinophils Relative 2 %   Eosinophils Absolute 0.1 0 - 0 K/uL   Basophils Relative 1 %   Basophils Absolute 0.0 0 - 0 K/uL   Immature Granulocytes 0 %   Abs Immature Granulocytes 0.02 0.00 - 0.07 K/uL  Comprehensive metabolic panel     Status: Abnormal   Collection Time: 07/03/20  8:29 PM  Result Value Ref Range   Sodium 139 135 - 145 mmol/L   Potassium 3.5 3.5 - 5.1 mmol/L   Chloride 105 98 - 111 mmol/L   CO2 21 (L) 22 - 32 mmol/L   Glucose, Bld 120 (H) 70 - 99 mg/dL   BUN 19 8 - 23 mg/dL   Creatinine, Ser 0.84 0.61 - 1.24 mg/dL   Calcium 8.8 (L) 8.9 - 10.3 mg/dL   Total Protein 6.8 6.5 - 8.1 g/dL   Albumin 4.1 3.5 - 5.0 g/dL   AST 18 15 - 41 U/L   ALT 16 0 - 44 U/L   Alkaline Phosphatase 55 38 - 126 U/L    Total Bilirubin 0.9 0.3 - 1.2 mg/dL   GFR calc non Af Amer >60 >60 mL/min   GFR calc Af Amer >60 >60 mL/min   Anion gap 13 5 - 15  Lipase, blood     Status: None   Collection Time: 07/03/20  8:29 PM  Result Value Ref Range   Lipase 27 11 - 51 U/L  Protime-INR     Status: Abnormal   Collection Time: 07/03/20 10:43 PM  Result Value Ref Range   Prothrombin Time 44.8 (H) 11.4 - 15.2 seconds   INR 5.0 (HH) 0.8 - 1.2  Respiratory Panel by RT PCR (Flu A&B, Covid) - Nasopharyngeal Swab     Status: None  Collection Time: 07/04/20 12:09 AM   Specimen: Nasopharyngeal Swab  Result Value Ref Range   SARS Coronavirus 2 by RT PCR NEGATIVE NEGATIVE   Influenza A by PCR NEGATIVE NEGATIVE   Influenza B by PCR NEGATIVE NEGATIVE  Protime-INR     Status: Abnormal   Collection Time: 07/04/20  2:19 AM  Result Value Ref Range   Prothrombin Time 18.5 (H) 11.4 - 15.2 seconds   INR 1.6 (H) 0.8 - 1.2  CBC     Status: Abnormal   Collection Time: 07/04/20  9:56 AM  Result Value Ref Range   WBC 8.0 4.0 - 10.5 K/uL   RBC 3.83 (L) 4.22 - 5.81 MIL/uL   Hemoglobin 12.0 (L) 13.0 - 17.0 g/dL   HCT 35.4 (L) 39 - 52 %   MCV 92.4 80.0 - 100.0 fL   MCH 31.3 26.0 - 34.0 pg   MCHC 33.9 30.0 - 36.0 g/dL   RDW 14.3 11.5 - 15.5 %   Platelets 185 150 - 400 K/uL   nRBC 0.0 0.0 - 0.2 %  Basic metabolic panel     Status: Abnormal   Collection Time: 07/04/20  9:56 AM  Result Value Ref Range   Sodium 137 135 - 145 mmol/L   Potassium 3.6 3.5 - 5.1 mmol/L   Chloride 104 98 - 111 mmol/L   CO2 23 22 - 32 mmol/L   Glucose, Bld 110 (H) 70 - 99 mg/dL   BUN 17 8 - 23 mg/dL   Creatinine, Ser 0.82 0.61 - 1.24 mg/dL   Calcium 8.1 (L) 8.9 - 10.3 mg/dL   GFR calc non Af Amer >60 >60 mL/min   GFR calc Af Amer >60 >60 mL/min   Anion gap 10 5 - 15  Protime-INR     Status: None   Collection Time: 07/04/20  9:56 AM  Result Value Ref Range   Prothrombin Time 14.9 11.4 - 15.2 seconds   INR 1.2 0.8 - 1.2  Protime-INR     Status:  Abnormal   Collection Time: 07/04/20 10:56 AM  Result Value Ref Range   Prothrombin Time 15.4 (H) 11.4 - 15.2 seconds   INR 1.3 (H) 0.8 - 1.2    Recent Results (from the past 240 hour(s))  Respiratory Panel by RT PCR (Flu A&B, Covid) - Nasopharyngeal Swab     Status: None   Collection Time: 07/04/20 12:09 AM   Specimen: Nasopharyngeal Swab  Result Value Ref Range Status   SARS Coronavirus 2 by RT PCR NEGATIVE NEGATIVE Final    Comment: (NOTE) SARS-CoV-2 target nucleic acids are NOT DETECTED.  The SARS-CoV-2 RNA is generally detectable in upper respiratoy specimens during the acute phase of infection. The lowest concentration of SARS-CoV-2 viral copies this assay can detect is 131 copies/mL. A negative result does not preclude SARS-Cov-2 infection and should not be used as the sole basis for treatment or other patient management decisions. A negative result may occur with  improper specimen collection/handling, submission of specimen other than nasopharyngeal swab, presence of viral mutation(s) within the areas targeted by this assay, and inadequate number of viral copies (<131 copies/mL). A negative result must be combined with clinical observations, patient history, and epidemiological information. The expected result is Negative.  Fact Sheet for Patients:  PinkCheek.be  Fact Sheet for Healthcare Providers:  GravelBags.it  This test is no t yet approved or cleared by the [place] FDA and  has been authorized for detection and/or diagnosis of SARS-CoV-2 by FDA  under an Emergency Use Authorization (EUA). This EUA will remain  in effect (meaning this test can be used) for the duration of the COVID-19 declaration under Section 564(b)(1) of the Act, 21 U.S.C. section 360bbb-3(b)(1), unless the authorization is terminated or revoked sooner.     Influenza A by PCR NEGATIVE NEGATIVE Final   Influenza B by PCR NEGATIVE  NEGATIVE Final    Comment: (NOTE) The Xpert Xpress SARS-CoV-2/FLU/RSV assay is intended as an aid in  the diagnosis of influenza from Nasopharyngeal swab specimens and  should not be used as a sole basis for treatment. Nasal washings and  aspirates are unacceptable for Xpert Xpress SARS-CoV-2/FLU/RSV  testing.  Fact Sheet for Patients: PinkCheek.be  Fact Sheet for Healthcare Providers: GravelBags.it  This test is not yet approved or cleared by the [place] FDA and  has been authorized for detection and/or diagnosis of SARS-CoV-2 by  FDA under an Emergency Use Authorization (EUA). This EUA will remain  in effect (meaning this test can be used) for the duration of the  Covid-19 declaration under Section 564(b)(1) of the Act, 21  U.S.C. section 360bbb-3(b)(1), unless the authorization is  terminated or revoked. Performed at [facility], [place] [address] [place], [place] [address]      Radiology Studies: CT Abdomen Pelvis W Contrast  Result Date: 07/03/2020 CLINICAL DATA:  Right lower quadrant abdominal pain EXAM: CT ABDOMEN AND PELVIS WITH CONTRAST TECHNIQUE: Multidetector CT imaging of the abdomen and pelvis was performed using the standard protocol following bolus administration of intravenous contrast. CONTRAST:  140mL OMNIPAQUE IOHEXOL 300 MG/ML  SOLN COMPARISON:  Contemporary lumbar CT reconstruction, MR lumbar spine 04/03/2013 FINDINGS: Lower chest: Bandlike areas of opacity in the lung bases, likely atelectasis or scarring. No consolidation or effusion is seen. Normal heart size. No pericardial effusion. Hepatobiliary: No worrisome focal liver lesions. Smooth liver surface contour. Normal hepatic attenuation. Physiologic distension of the gallbladder. No calcified gallstones or pericholecystic inflammation. No biliary ductal dilatation. Pancreas: Partial fatty replacement of the pancreas. No pancreatic  ductal dilatation or surrounding inflammatory changes. Spleen: Normal in size. No concerning splenic lesions. Adrenals/Urinary Tract: Normal adrenal glands. Kidneys are normally located with symmetric enhancement and excretion. No suspicious renal lesion, urolithiasis or hydronephrosis. Moderate distention of the urinary bladder without other gross abnormality. Stomach/Bowel: Small sliding-type hiatal hernia. Distal stomach is unremarkable. Intermediate attenuation fluid along the duodenal sweep may be redistributed hemorrhage within the retroperitoneum. No focal bowel wall and abnormality in this vicinity. No extraluminal air. No small bowel thickening or dilatation. No colonic dilatation or wall thickening. Trace free fluid is seen in the right paracolic gutter, possibly redistributed as well. A normal appendix is visualized. No colonic dilatation or wall thickening. Vascular/Lymphatic: There are multiple serpiginous blushes of hyperattenuation within the right iliacus musculature concerning for sites of active contrast extravasation. No other sites concerning for active contrast extravasation. No direct vascular injury is identified. Atherosclerotic calcifications within the abdominal aorta and branch vessels. No aneurysm or ectasia. No enlarged abdominopelvic lymph nodes. Reproductive: Prostate appears borderline enlarged with heterogeneous calcifications though largely obscured by streak artifact from bilateral hip prostheses. Trace right hydrocele is noted. No other acute abnormality of the external genitalia. Other: There is hemorrhage and fluid predominantly throughout the right retroperitoneum which likely arises from the sites of active extravasation in the right iliacus as detailed above. This fluid in hemorrhage does appear to redistributed into the posterior and anterior pararenal space as well as about the duodenum as  denoted above. Musculoskeletal: There sites of contrast blush within the right  iliacus muscle with additional heterogeneous hemorrhage along the psoas more proximally as well as what is likely a layering intramuscular hemorrhage of the more inferior iliopsoas muscle though partially obscured by adjacent streak artifact from the right hip prosthesis, series 2, image 87. 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 And acetabular component fixation screw in the right stands proud of the superior acetabular cortex (6/113) similarly, a fixation screw in the left closely approximates the cortex along the left ilium (6/100). No other acute or worrisome abnormalities of bony pelvis. Dedicated lumbar reconstructions are generated and dictated separately. Please see report for further details. In brief, there appears to be multilevel discogenic and facet degenerative changes as well as several small vertebral body hemangiomata but without acute osseous injury or suspicious osseous lesion. IMPRESSION: 1. Multiple serpiginous blushes of hyperattenuation within the right iliacus muscle concerning for sites of active contrast extravasation with extensive thickening and intramuscular hemorrhage. Layering intramuscular hematoma is seen within the iliopsoas muscle of the anterior proximal thigh as well as additional retroperitoneal hemorrhage tracking superiorly along the psoas and distributed throughout the right retroperitoneal spaces. 2. Hyperdense hemorrhage is present in the retroperitoneum along the duodenal sweep though this is favored to be redistributed without acute abnormality of the bowel or extraluminal gas. 3. Trace intraperitoneal fluid/hemorrhage in the right paracolic gutter and deep pelvis, possibly redistributed as well. 4. No other acute abnormality in the abdomen or pelvis. Specifically, the appendix is normal. 5. 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 6. Dedicated lumbar reconstructions are generated and dictated  separately. 7. Aortic Atherosclerosis (ICD10-I70.0). These results were called by telephone at the time of interpretation on 07/03/2020 at 10:56 pm to provider Dr Maryan Rued, who verbally acknowledged these results. Electronically Signed   By: Lovena Le M.D.   On: 07/03/2020 22:57   CT L-SPINE NO CHARGE  Result Date: 07/03/2020 CLINICAL DATA:  Right upper leg pain EXAM: CT Lumbar spine without contrast TECHNIQUE: Multiplanar CT images of the lumbar spine were reconstructed from contemporary CT of the Chest, Abdomen, and Pelvis COMPARISON:  Contemporary CT abdomen pelvis, MR lumbar spine 04/03/2013, radiographs 04/08/2013 FINDINGS: CT LUMBAR SPINE FINDINGS Segmentation: 5 normally formed lumbar type vertebral levels. Lowest fully formed disc space denoted as L5-S1. Partial sacralization of the L5 transverse processes is noted incidentally, benign variant. Alignment: Straightening of the normal lumbar lordosis. No significant spondylolisthesis or spondylolysis. No abnormally widened, perched or jumped facets. Vertebrae: No acute fracture or vertebral body height loss is evident. Benign vertebral body hemangiomata are present in the T12, L2 and L3 levels, seen on comparison MRI. No worrisome lytic or blastic lesions are seen. Some fairly prominent Schmorl's node formations are noted in the anterior inferior endplate L4, at least several of which are new from comparison in 2014. 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 Some predominantly anterior endplate spurring is noted. Multilevel discogenic and facet degenerative changes are present, further detailed below. 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 Paraspinal and other soft tissues: No perispinal fluid, swelling, gas or hemorrhage. No visible canal hematoma. Retroperitoneal hemorrhage centered within the right iliacus at a site of active contrast extravasation is better detailed on  contemporary CT of the abdomen and pelvis, dictated separately. Aortoiliac atherosclerosis noted as well. For additional findings in the abdomen and pelvis, please see dedicated CT from which this study is  reconstructed. Disc levels: Level by level evaluation of the lumbar spine below: T11-T12: Mild disc height loss without posterior disc abnormality. Mild bilateral facet arthropathy. No significant spinal canal or foraminal stenosis. T12-L1: Mild disc height loss without significant posterior disc abnormality. Mild bilateral facet arthropathy. No significant spinal canal or foraminal stenosis. L1-L2: Mild to moderate disc height loss with global disc bulge and mild bilateral facet arthropathy. At most mild canal stenosis and partial effacement of the lateral recesses. No significant foraminal narrowing. L2-L3: Moderate disc height loss with asymmetric global disc bulge eccentric to the left subarticular zone with moderate bilateral facet arthropathy. Mild canal stenosis and near complete effacement of the left lateral recess. No significant foraminal impingement. L3-L4: Moderate disc height loss with global disc bulge and a superimposed inferiorly directed disc extrusion extending 6 mm below the disc space. Mild canal stenosis and some asymmetric effacement of the right lateral recess. No significant foraminal impingement. L4-L5: Moderate to severe disc height loss with disc desiccation and vacuum disc phenomenon as well as a global disc bulge asymmetric to the right subarticular zone with central disc protrusion and moderate to severe bilateral facet arthropathy resulting and some moderate canal stenosis, severe lateral recess narrowing, and moderate bilateral foraminal narrowing, left slightly greater than right. L5-S1: Severe disc height loss, disc desiccation and vacuum disc with Modic type endplate changes as well as a global disc bulge and severe bilateral facet arthropathy. Findings result in some mild canal  stenosis and effacement of the lateral recesses as well as moderate to severe bilateral foraminal narrowing. IMPRESSION: 1. No acute fracture or vertebral body height loss identified. 2. Benign vertebral body hemangiomata T12, L2 and L3. 3. Multilevel degenerative changes of the lumbar spine as described level by level above. Features maximal L4-S1. Stenotic changes are likely accentuated by what appear to be congenitally shortened pedicles. 4. Interspinous arthrosis compatible with Baastrup's disease. 5. Retroperitoneal hemorrhage centered within the right iliacus in distributed throughout the retroperitoneum better detailed on dedicated CT from which this study is reconstructed. Electronically Signed   By: Lovena Le M.D.   On: 07/03/2020 23:11   VAS [place] LOWER EXTREMITY VENOUS (DVT) (ONLY [facility] & [facility])  Result Date: 07/03/2020  Lower Venous DVTStudy Indications: Pain in right thigh.  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 Comparison Study: 04-10-2013 LT lower extremity study available. Performing Technologist: Darlin Coco  Examination Guidelines: A complete evaluation includes B-mode imaging, spectral Doppler, color Doppler, and power Doppler as needed of all accessible portions of each vessel. Bilateral testing is considered an integral part of a complete examination. Limited examinations for reoccurring indications may be performed as noted. The reflux portion of the exam is performed with the patient in reverse Trendelenburg.  +---------+---------------+---------+-----------+----------+------------------+  RIGHT     Compressibility Phasicity Spontaneity Properties Thrombus Aging      +---------+---------------+---------+-----------+----------+------------------+  CFV       Full            Yes       Yes                                        +---------+---------------+---------+-----------+----------+------------------+  SFJ       Full                                                                  +---------+---------------+---------+-----------+----------+------------------+  FV Prox   Full                                                                 +---------+---------------+---------+-----------+----------+------------------+  FV Mid    Full                                                                 +---------+---------------+---------+-----------+----------+------------------+  FV Distal Full                                                                 +---------+---------------+---------+-----------+----------+------------------+  PFV       Full                                                                 +---------+---------------+---------+-----------+----------+------------------+  POP       Full            Yes       Yes                    Fibrin stranding                                                                noted               +---------+---------------+---------+-----------+----------+------------------+  PTV       Full                                                                 +---------+---------------+---------+-----------+----------+------------------+  PERO      Full                                                                 +---------+---------------+---------+-----------+----------+------------------+   +----+---------------+---------+-----------+----------+--------------+  LEFT Compressibility Phasicity Spontaneity Properties Thrombus Aging  +----+---------------+---------+-----------+----------+--------------+  CFV  Full            Yes       Yes                                    +----+---------------+---------+-----------+----------+--------------+  Summary: RIGHT: - There is no evidence of deep vein thrombosis in the lower extremity.  - No cystic structure found in the popliteal fossa.  LEFT: - No evidence of common femoral vein obstruction.  *See table(s) above for measurements and observations.    Preliminary    CT Abdomen Pelvis W Contrast    Final Result    CT L-SPINE NO CHARGE  Final Result    VAS [place] LOWER EXTREMITY VENOUS (DVT) (ONLY [facility])        Scheduled Meds:  budesonide  9 mg Oral BID   ezetimibe  10 mg Oral Daily   sertraline  200 mg Oral Daily   simvastatin  40 mg Oral Daily   PRN Meds: acetaminophen **OR** acetaminophen, HYDROmorphone (DILAUDID) injection, ondansetron **OR** ondansetron (ZOFRAN) IV Continuous Infusions:    LOS: 0 days  Time spent: Greater than 50% of the 35 minute visit was spent in counseling/coordination of care for the patient as laid out in the A&P.   Dwyane Dee, MD [facility] 07/04/2020, 12:26 PM  Contact via secure chat.  To contact the attending provider between 7A-7P or the covering provider during after hours 7P-7A, please log into the web site [email or web address] and access using universal  password for that web site. If you do not have the password, please call the hospital operator.

## 2020-07-04 NOTE — H&P (Signed)
History and Physical    Paul Luna TDD:[id number] DOB: [birth year] DOA: 07/03/2020  PCP: Lilian Coma., MD  Patient coming from: Home  I have personally briefly reviewed patient's old medical records in [place]  Chief Complaint: Leg pain  HPI: Paul Luna is a 79 y.o. 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  Pt was started on coumadin back around 2010, has been on coumadin ever since that time.  Patient presents to the ED with c/o 5 day h/o right upper leg pain and hip pain.  Severe, 10/10, worse with movement.  No fevers nor chills, no trauma.  No missed doses of coumadin, though he had been off of coumadin for a short period of time recently while he had a colonoscopy performed.  Restarted coumadin after colonoscopy but hasnt had INR checked yet.   ED Course: work up in the ED is significant for INR of 5.0.  CT abd/pelvis reveals a psoas hematoma with active extravasation and bleeding.  HGB 12.9   Review of Systems: As per HPI, otherwise all review of systems negative.  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   following hip surgery   HLD (hyperlipidemia)    Hypertension    UC (ulcerative colitis) ([place])     Past Surgical History:  Procedure Laterality Date   bladder cancer surgery  1991   managed by urologist   COLONOSCOPY  2021   Hx of many colon's; last 6 months ago, todays is a follow up to the    COLONOSCOPY W/ BIOPSIES AND POLYPECTOMY     Hx: of   LUMBAR LAMINECTOMY/DECOMPRESSION MICRODISCECTOMY Right 04/08/2013   Procedure: LUMBAR FOUR-FIVE LUMBAR LAMINECTOMY/DECOMPRESSION MICRODISCECTOMY 1 LEVEL;  Surgeon: Charlie Pitter, MD;  Location: [facility];  Service: Neurosurgery;  Laterality: Right;   TOTAL HIP ARTHROPLASTY Right    TOTAL HIP ARTHROPLASTY Left       reports that he quit smoking about 30 years ago. He has never used smokeless tobacco. He reports current alcohol use. He reports that he does not use drugs.  No Known Allergies  Family History  Problem Relation Age of Onset   Hypertension Mother    Heart disease Mother 15   Hypertension Father    Arthritis Father    Stroke Father 4   Colon cancer Brother 54   Colon polyps Brother    Heart disease Brother 31   Esophageal cancer Neg Hx    Rectal cancer Neg Hx    Stomach cancer Neg Hx      Prior to Admission medications   Medication Sig Start Date End Date Taking? Authorizing Provider  budesonide (ENTOCORT EC) 3 MG 24 hr capsule Take 9 mg by mouth in the morning and at bedtime. Dose will be tapered monthly: 9 mg twice daily for 30 days then 9 mg once Daily for 30 days, Then 6 mg once daily for 30 days, then continue 3 mg once daily 06/23/20  Yes Nandigam, Kavitha V, MD  ezetimibe (ZETIA) 10 MG tablet Take 1 tablet by mouth daily. 02/17/20  Yes [provider]  HYDROcodone-acetaminophen (NORCO/VICODIN) 5-325 MG tablet Take 1 tablet by mouth every 6 (six) hours as needed for moderate pain or severe pain. 07/02/20  Yes Melynda Ripple, MD  sertraline (ZOLOFT) 100 MG tablet  Take 200 mg by mouth daily.   Yes [provider]  simvastatin (ZOCOR) 40 MG tablet Take 1 tablet by mouth daily. 02/17/20  Yes [provider]  warfarin (JANTOVEN) 6 MG tablet Take 6 mg by mouth daily.   Yes [provider]  ascorbic acid (VITAMIN C) 500 MG tablet Take 500 mg by mouth daily.    [provider]  Calcium Carbonate (CALCIUM 600 PO) Take 1 tablet by mouth daily.    [provider]  Cholecalciferol (VITAMIN D-3 PO) Take 1 tablet by mouth daily.    [provider]  lidocaine (LIDODERM) 5 % Place 1 patch onto the skin daily. Remove & Discard patch within 12 hours or as directed by MD 07/02/20   Melynda Ripple, MD  Omega-3 Fatty  Acids (FISH OIL PO) Take 550 mg by mouth daily.    [provider]  Vedolizumab (ENTYVIO IV) Inject into the vein.    [provider]    Physical Exam: Vitals:   07/03/20 2115 07/03/20 2130 07/03/20 2211 07/04/20 0032  BP: (!) 167/85 (!) 153/103 (!) 155/79 (!) 145/86  Pulse: 75 78 82 81  Resp: 20  19 18   Temp:      TempSrc:      SpO2: 97% 96% 96% 92%    Constitutional: NAD, calm, comfortable Eyes: PERRL, lids and conjunctivae normal ENMT: Mucous membranes are moist. Posterior pharynx clear of any exudate or lesions.Normal dentition.  Neck: normal, supple, no masses, no thyromegaly Respiratory: clear to auscultation bilaterally, no wheezing, no crackles. Normal respiratory effort. No accessory muscle use.  Cardiovascular: Regular rate and rhythm, no murmurs / rubs / gallops. No extremity edema. 2+ pedal pulses. No carotid bruits.  Abdomen: no tenderness, no masses palpated. No hepatosplenomegaly. Bowel sounds positive.  Musculoskeletal: Severe pain with movement of RLE hip. Skin: no rashes, lesions, ulcers. No induration Neurologic: CN 2-12 grossly intact. Sensation intact, DTR normal. Strength 5/5 in all 4.  Psychiatric: Normal judgment and insight. Alert and oriented x 3. Normal mood.    Labs on Admission: I have personally reviewed following labs and imaging studies  CBC: Recent Labs  Lab 07/03/20 2029  WBC 8.6  NEUTROABS 6.2  HGB 12.9*  HCT 37.7*  MCV 89.5  PLT 161   Basic Metabolic Panel: Recent Labs  Lab 07/03/20 2029  NA 139  K 3.5  CL 105  CO2 21*  GLUCOSE 120*  BUN 19  CREATININE 0.84  CALCIUM 8.8*   GFR: CrCl cannot be calculated (Unknown ideal weight.). Liver Function Tests: Recent Labs  Lab 07/03/20 2029  AST 18  ALT 16  ALKPHOS 55  BILITOT 0.9  PROT 6.8  ALBUMIN 4.1   Recent Labs  Lab 07/03/20 2029  LIPASE 27   No results for input(s): AMMONIA in the last 168 hours. Coagulation Profile: Recent Labs  Lab  07/03/20 2243  INR 5.0*   Cardiac Enzymes: No results for input(s): CKTOTAL, CKMB, CKMBINDEX, TROPONINI in the last 168 hours. BNP (last 3 results) No results for input(s): PROBNP in the last 8760 hours. HbA1C: No results for input(s): HGBA1C in the last 72 hours. CBG: No results for input(s): GLUCAP in the last 168 hours. Lipid Profile: No results for input(s): CHOL, HDL, LDLCALC, TRIG, CHOLHDL, LDLDIRECT in the last 72 hours. Thyroid Function Tests: No results for input(s): TSH, T4TOTAL, FREET4, T3FREE, THYROIDAB in the last 72 hours. Anemia Panel: No results for input(s): VITAMINB12, FOLATE, FERRITIN, TIBC, IRON, RETICCTPCT in the last  72 hours. Urine analysis:    Component Value Date/Time   BILIRUBINUR neg 12/12/2012 1121   PROTEINUR neg 12/12/2012 1121   UROBILINOGEN 0.2 12/12/2012 1121   NITRITE neg 12/12/2012 1121   LEUKOCYTESUR Negative 12/12/2012 1121    Radiological Exams on Admission: CT Abdomen Pelvis W Contrast  Result Date: 07/03/2020 CLINICAL DATA:  Right lower quadrant abdominal pain EXAM: CT ABDOMEN AND PELVIS WITH CONTRAST TECHNIQUE: Multidetector CT imaging of the abdomen and pelvis was performed using the standard protocol following bolus administration of intravenous contrast. CONTRAST:  132mL OMNIPAQUE IOHEXOL 300 MG/ML  SOLN COMPARISON:  Contemporary lumbar CT reconstruction, MR lumbar spine 04/03/2013 FINDINGS: Lower chest: Bandlike areas of opacity in the lung bases, likely atelectasis or scarring. No consolidation or effusion is seen. Normal heart size. No pericardial effusion. Hepatobiliary: No worrisome focal liver lesions. Smooth liver surface contour. Normal hepatic attenuation. Physiologic distension of the gallbladder. No calcified gallstones or pericholecystic inflammation. No biliary ductal dilatation. Pancreas: Partial fatty replacement of the pancreas. No pancreatic ductal dilatation or surrounding inflammatory changes. Spleen: Normal in size. No  concerning splenic lesions. Adrenals/Urinary Tract: Normal adrenal glands. Kidneys are normally located with symmetric enhancement and excretion. No suspicious renal lesion, urolithiasis or hydronephrosis. Moderate distention of the urinary bladder without other gross abnormality. Stomach/Bowel: Small sliding-type hiatal hernia. Distal stomach is unremarkable. Intermediate attenuation fluid along the duodenal sweep may be redistributed hemorrhage within the retroperitoneum. No focal bowel wall and abnormality in this vicinity. No extraluminal air. No small bowel thickening or dilatation. No colonic dilatation or wall thickening. Trace free fluid is seen in the right paracolic gutter, possibly redistributed as well. A normal appendix is visualized. No colonic dilatation or wall thickening. Vascular/Lymphatic: There are multiple serpiginous blushes of hyperattenuation within the right iliacus musculature concerning for sites of active contrast extravasation. No other sites concerning for active contrast extravasation. No direct vascular injury is identified. Atherosclerotic calcifications within the abdominal aorta and branch vessels. No aneurysm or ectasia. No enlarged abdominopelvic lymph nodes. Reproductive: Prostate appears borderline enlarged with heterogeneous calcifications though largely obscured by streak artifact from bilateral hip prostheses. Trace right hydrocele is noted. No other acute abnormality of the external genitalia. Other: There is hemorrhage and fluid predominantly throughout the right retroperitoneum which likely arises from the sites of active extravasation in the right iliacus as detailed above. This fluid in hemorrhage does appear to redistributed into the posterior and anterior pararenal space as well as about the duodenum as denoted above. Musculoskeletal: There sites of contrast blush within the right iliacus muscle with additional heterogeneous hemorrhage along the psoas more proximally  as well as what is likely a layering intramuscular hemorrhage of the more inferior iliopsoas muscle though partially obscured by adjacent streak artifact from the right hip prosthesis, series 2, image 87. 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 And acetabular component fixation screw in the right stands proud of the superior acetabular cortex (6/113) similarly, a fixation screw in the left closely approximates the cortex along the left ilium (6/100). No other acute or worrisome abnormalities of bony pelvis. Dedicated lumbar reconstructions are generated and dictated separately. Please see report for further details. In brief, there appears to be multilevel discogenic and facet degenerative changes as well as several small vertebral body hemangiomata but without acute osseous injury or suspicious osseous lesion. IMPRESSION: 1. Multiple serpiginous blushes of hyperattenuation within the right iliacus muscle concerning for sites of active contrast extravasation with extensive thickening and intramuscular hemorrhage. Layering intramuscular hematoma is  seen within the iliopsoas muscle of the anterior proximal thigh as well as additional retroperitoneal hemorrhage tracking superiorly along the psoas and distributed throughout the right retroperitoneal spaces. 2. Hyperdense hemorrhage is present in the retroperitoneum along the duodenal sweep though this is favored to be redistributed without acute abnormality of the bowel or extraluminal gas. 3. Trace intraperitoneal fluid/hemorrhage in the right paracolic gutter and deep pelvis, possibly redistributed as well. 4. No other acute abnormality in the abdomen or pelvis. Specifically, the appendix is normal. 5. 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 6. Dedicated lumbar reconstructions are generated and dictated separately. 7. Aortic Atherosclerosis (ICD10-I70.0). These results were called by  telephone at the time of interpretation on 07/03/2020 at 10:56 pm to provider Dr Maryan Rued, who verbally acknowledged these results. Electronically Signed   By: Lovena Le M.D.   On: 07/03/2020 22:57   CT L-SPINE NO CHARGE  Result Date: 07/03/2020 CLINICAL DATA:  Right upper leg pain EXAM: CT Lumbar spine without contrast TECHNIQUE: Multiplanar CT images of the lumbar spine were reconstructed from contemporary CT of the Chest, Abdomen, and Pelvis COMPARISON:  Contemporary CT abdomen pelvis, MR lumbar spine 04/03/2013, radiographs 04/08/2013 FINDINGS: CT LUMBAR SPINE FINDINGS Segmentation: 5 normally formed lumbar type vertebral levels. Lowest fully formed disc space denoted as L5-S1. Partial sacralization of the L5 transverse processes is noted incidentally, benign variant. Alignment: Straightening of the normal lumbar lordosis. No significant spondylolisthesis or spondylolysis. No abnormally widened, perched or jumped facets. Vertebrae: No acute fracture or vertebral body height loss is evident. Benign vertebral body hemangiomata are present in the T12, L2 and L3 levels, seen on comparison MRI. No worrisome lytic or blastic lesions are seen. Some fairly prominent Schmorl's node formations are noted in the anterior inferior endplate L4, at least several of which are new from comparison in 2014. 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 Some predominantly anterior endplate spurring is noted. Multilevel discogenic and facet degenerative changes are present, further detailed below. 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 Paraspinal and other soft tissues: No perispinal fluid, swelling, gas or hemorrhage. No visible canal hematoma. Retroperitoneal hemorrhage centered within the right iliacus at a site of active contrast extravasation is better detailed on contemporary CT of the abdomen and pelvis, dictated separately. Aortoiliac atherosclerosis  noted as well. For additional findings in the abdomen and pelvis, please see dedicated CT from which this study is reconstructed. Disc levels: Level by level evaluation of the lumbar spine below: T11-T12: Mild disc height loss without posterior disc abnormality. Mild bilateral facet arthropathy. No significant spinal canal or foraminal stenosis. T12-L1: Mild disc height loss without significant posterior disc abnormality. Mild bilateral facet arthropathy. No significant spinal canal or foraminal stenosis. L1-L2: Mild to moderate disc height loss with global disc bulge and mild bilateral facet arthropathy. At most mild canal stenosis and partial effacement of the lateral recesses. No significant foraminal narrowing. L2-L3: Moderate disc height loss with asymmetric global disc bulge eccentric to the left subarticular zone with moderate bilateral facet arthropathy. Mild canal stenosis and near complete effacement of the left lateral recess. No significant foraminal impingement. L3-L4: Moderate disc height loss with global disc bulge and a superimposed inferiorly directed disc extrusion extending 6 mm below the disc space. Mild canal stenosis and some asymmetric effacement of the right lateral recess. No significant foraminal impingement. L4-L5: Moderate to severe disc height loss with disc desiccation and vacuum disc phenomenon  as well as a global disc bulge asymmetric to the right subarticular zone with central disc protrusion and moderate to severe bilateral facet arthropathy resulting and some moderate canal stenosis, severe lateral recess narrowing, and moderate bilateral foraminal narrowing, left slightly greater than right. L5-S1: Severe disc height loss, disc desiccation and vacuum disc with Modic type endplate changes as well as a global disc bulge and severe bilateral facet arthropathy. Findings result in some mild canal stenosis and effacement of the lateral recesses as well as moderate to severe bilateral  foraminal narrowing. IMPRESSION: 1. No acute fracture or vertebral body height loss identified. 2. Benign vertebral body hemangiomata T12, L2 and L3. 3. Multilevel degenerative changes of the lumbar spine as described level by level above. Features maximal L4-S1. Stenotic changes are likely accentuated by what appear to be congenitally shortened pedicles. 4. Interspinous arthrosis compatible with Baastrup's disease. 5. Retroperitoneal hemorrhage centered within the right iliacus in distributed throughout the retroperitoneum better detailed on dedicated CT from which this study is reconstructed. Electronically Signed   By: Lovena Le M.D.   On: 07/03/2020 23:11   VAS [place] LOWER EXTREMITY VENOUS (DVT) (ONLY [facility] & [facility])  Result Date: 07/03/2020  Lower Venous DVTStudy Indications: Pain in right thigh.  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 Comparison Study: 04-10-2013 LT lower extremity study available. Performing Technologist: Darlin Coco  Examination Guidelines: A complete evaluation includes B-mode imaging, spectral Doppler, color Doppler, and power Doppler as needed of all accessible portions of each vessel. Bilateral testing is considered an integral part of a complete examination. Limited examinations for reoccurring indications may be performed as noted. The reflux portion of the exam is performed with the patient in reverse Trendelenburg.  +---------+---------------+---------+-----------+----------+------------------+  RIGHT     Compressibility Phasicity Spontaneity Properties Thrombus Aging      +---------+---------------+---------+-----------+----------+------------------+  CFV       Full            Yes       Yes                                        +---------+---------------+---------+-----------+----------+------------------+  SFJ       Full                                                                 +---------+---------------+---------+-----------+----------+------------------+  FV Prox    Full                                                                 +---------+---------------+---------+-----------+----------+------------------+  FV Mid    Full                                                                 +---------+---------------+---------+-----------+----------+------------------+  FV Distal Full                                                                 +---------+---------------+---------+-----------+----------+------------------+  PFV       Full                                                                 +---------+---------------+---------+-----------+----------+------------------+  POP       Full            Yes       Yes                    Fibrin stranding                                                                noted               +---------+---------------+---------+-----------+----------+------------------+  PTV       Full                                                                 +---------+---------------+---------+-----------+----------+------------------+  PERO      Full                                                                 +---------+---------------+---------+-----------+----------+------------------+   +----+---------------+---------+-----------+----------+--------------+  LEFT Compressibility Phasicity Spontaneity Properties Thrombus Aging  +----+---------------+---------+-----------+----------+--------------+  CFV  Full            Yes       Yes                                    +----+---------------+---------+-----------+----------+--------------+     Summary: RIGHT: - There is no evidence of deep vein thrombosis in the lower extremity.  - No cystic structure found in the popliteal fossa.  LEFT: - No evidence of common femoral vein obstruction.  *See table(s) above for measurements and observations.    Preliminary     EKG: Independently reviewed.  Assessment/Plan Principal Problem:   Psoas hematoma, right, secondary to anticoagulant  therapy, initial encounter Active Problems:   Long term current use of anticoagulant therapy   Ulcerative colitis ([place])    1. Psoas hematoma, R, secondary to coumadin with INR 5.0 - 1. Reverse coumadin with coumadin reversal protocol:  1. K.Centra 2. Vit K 2. Repeat CBC in AM 3. Dilaudid PRN pain control 4. Patient open to idea of switching to eliquis or xarelto when it comes time to restart anticoagulation.  His PCP had suggested this in June (see June office note). 2. UC - 1. Cont home meds  DVT prophylaxis: SCDs, INR 5.0, active bleed Code Status: Full Family Communication: No family in room Disposition Plan: Home after bleeding stopped, HGB stable, and pain controlled Consults called: None Admission status: Place in obs     Paul Luna, [place] Hospitalists  How to contact the [facility] Attending or Consulting provider [place] or covering provider during after hours [place], for this patient?  1. Check the care team in [facility] and look for a) attending/consulting [facility] provider listed and b) the [facility] team listed 2. Log into [email or web address]  Amion Physician Scheduling and messaging for groups and whole hospitals  On call and physician scheduling software for group practices, residents, hospitalists and other medical providers for call, clinic, rotation and shift schedules. On[employer] is a hospital-wide system for scheduling doctors and paging doctors on call. EasyPlot is for scientific plotting and data analysis.  [email or web address]  and use [place]'s universal password to access. If you do not have the password, please contact the hospital operator.  3. Locate the [facility] provider you are looking for under [facility] and page to a number that you can be directly reached. 4. If you still have difficulty reaching the provider, please page the [facility] (Director on Call) for the Hospitalists listed on amion for assistance.  07/04/2020, 12:46 AM

## 2020-07-04 NOTE — Hospital Course (Addendum)
Mr. Paul Luna is a 79 yo CM with PMH DVT/PE on chronic Coumadin (since 2010), UC, HTN, arthritis who presented with right thigh pain up to his groin.  He was seen at urgent care with similar complaints on 07/02/2020 and discharged home with pain prescription.  He returned for ongoing symptoms.  He underwent right lower extremity duplex on admission which was negative for DVT. He then underwent CT abdomen/pelvis which revealed right iliacus muscle noted with thickening and intramuscular hemorrhage as well as hematoma seen within the iliopsoas muscle and retroperitoneal hemorrhage tracking along the psoas and distributed throughout the right retroperitoneal spaces.  There was also trace intraperitoneal fluid/hemorrhage in the right paracolic gutter/deep pelvis.  His INR on admission was 5 and he underwent reversal with [facility] and vitamin K 10 mg x 1.  Repeat INR was 1.6 and remained reversed with trending.  He was hemodynamically stable.  Hemoglobin was also trended on admission.  Due to his CT findings, surgery was also consulted for further recommendations. Hematology was consulted regarding further recommendations regarding resuming AC vs alternative drug choices if needed as well. His INR remained stable and his clinical status did not worsen. PT was consulted for evaluating patient's mobility status given his RLE pain interfering with his ability to ambulate well.   He improved clinically with ongoing monitoring. Hgb remained stable and right leg/thigh pain improved slowly.  At discharge, he will follow-up with hematology after approximately 3 to 4 weeks for discussion regarding resumption of anticoagulation.  A lupus anticoagulant panel was ordered during hospitalization and results will be discussed at follow-up appointment with hematology as well.  He was considered stable for discharging home, understands the plan for holding his Coumadin and will follow up outpatient.

## 2020-07-04 NOTE — Assessment & Plan Note (Addendum)
-   Patient has pain extending down into right thigh. Currently no s/s compartment syndrome - continue to monitor for any developing signs of compartment syndrome in RLE - PT eval as pain is limiting mobility; will be okay for HHPT with eval - tylenol recommended at discharge; his pain improved significantly prior to discharge

## 2020-07-04 NOTE — Assessment & Plan Note (Addendum)
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 INR has been reversed with [facility] and Vit K on admission  - outpatient follow up with hematology to discuss anticoagulation; patient still wishes to resume likely on a DOAC and very much understands risks and benefits of long term anticoagulation

## 2020-07-04 NOTE — Consult Note (Signed)
Reason for Consult: Retroperitoneal hematoma Referring Physician: Dr. Lanell Persons Gell is an 79 y.o. male.  HPI: Patient is a 79 year old male, who comes in with a history of blood clots, on Coumadin, who comes in with right-sided leg pain.  He states this is been going on for approximately a week.  He states that the pain was fairly significant on Friday proceeded to the ER.  Upon evaluation the ER patient underwent CT scan and was found to have a right retroperitoneal hematoma and some fluid within the pelvis.  Secondary to this patient also had a supratherapeutic INR over 5.  Patient had this reversed.  Patient's follow-up H&H on hospital day 2 was stable.  Patient's had no history of hypotension in the hospital.  General surgery was consulted for evaluation.  Past Medical History:  Diagnosis Date  . Adenomatous colon polyp   . Anxiety    chronic  . Arthritis   . Bladder cancer ([place]) 1991  . Clotting disorder ([place]) 2017   following hip surgery  . HLD (hyperlipidemia)   . Hypertension   . UC (ulcerative colitis) [facility])     Past Surgical History:  Procedure Laterality Date  . bladder cancer surgery  1991   managed by urologist  . COLONOSCOPY  2021   Hx of many colon's; last 6 months ago, todays is a follow up to the   . COLONOSCOPY W/ BIOPSIES AND POLYPECTOMY     Hx: of  . LUMBAR LAMINECTOMY/DECOMPRESSION MICRODISCECTOMY Right 04/08/2013   Procedure: LUMBAR FOUR-FIVE LUMBAR LAMINECTOMY/DECOMPRESSION MICRODISCECTOMY 1 LEVEL;  Surgeon: Charlie Pitter, MD;  Location: [facility];  Service: Neurosurgery;  Laterality: Right;  . TOTAL HIP ARTHROPLASTY Right   . TOTAL HIP ARTHROPLASTY Left     Family History  Problem Relation Age of Onset  . Hypertension Mother   . Heart disease Mother 23  . Hypertension Father   . Arthritis Father   . Stroke Father 63  . Colon cancer Brother 67  . Colon polyps Brother   . Heart disease Brother 43  . Esophageal cancer Neg Hx   . Rectal  cancer Neg Hx   . Stomach cancer Neg Hx     Social History:  reports that he quit smoking about 30 years ago. He has never used smokeless tobacco. He reports current alcohol use. He reports that he does not use drugs.  Allergies: No Known Allergies  Medications: I have reviewed the patient's current medications.  Results for orders placed or performed during the hospital encounter of 07/03/20 (from the past 48 hour(s))  CBC with Differential     Status: Abnormal   Collection Time: 07/03/20  8:29 PM  Result Value Ref Range   WBC 8.6 4.0 - 10.5 K/uL   RBC 4.21 (L) 4.22 - 5.81 MIL/uL   Hemoglobin 12.9 (L) 13.0 - 17.0 g/dL   HCT 37.7 (L) 39 - 52 %   MCV 89.5 80.0 - 100.0 fL   MCH 30.6 26.0 - 34.0 pg   MCHC 34.2 30.0 - 36.0 g/dL   RDW 13.9 11.5 - 15.5 %   Platelets 215 150 - 400 K/uL   nRBC 0.0 0.0 - 0.2 %   Neutrophils Relative % 71 %   Neutro Abs 6.2 1.7 - 7.7 K/uL   Lymphocytes Relative 17 %   Lymphs Abs 1.5 0.7 - 4.0 K/uL   Monocytes Relative 9 %   Monocytes Absolute 0.7 0 - 1 K/uL   Eosinophils Relative 2 %  Eosinophils Absolute 0.1 0 - 0 K/uL   Basophils Relative 1 %   Basophils Absolute 0.0 0 - 0 K/uL   Immature Granulocytes 0 %   Abs Immature Granulocytes 0.02 0.00 - 0.07 K/uL    Comment: Performed at [facility], [place] [address] [place], [place] [address]  Comprehensive metabolic panel     Status: Abnormal   Collection Time: 07/03/20  8:29 PM  Result Value Ref Range   Sodium 139 135 - 145 mmol/L   Potassium 3.5 3.5 - 5.1 mmol/L   Chloride 105 98 - 111 mmol/L   CO2 21 (L) 22 - 32 mmol/L   Glucose, Bld 120 (H) 70 - 99 mg/dL    Comment: Glucose reference range applies only to samples taken after fasting for at least 8 hours.   BUN 19 8 - 23 mg/dL   Creatinine, Ser 0.84 0.61 - 1.24 mg/dL   Calcium 8.8 (L) 8.9 - 10.3 mg/dL   Total Protein 6.8 6.5 - 8.1 g/dL   Albumin 4.1 3.5 - 5.0 g/dL   AST 18 15 - 41 U/L   ALT 16 0 - 44 U/L   Alkaline  Phosphatase 55 38 - 126 U/L   Total Bilirubin 0.9 0.3 - 1.2 mg/dL   GFR calc non Af Amer >60 >60 mL/min   GFR calc Af Amer >60 >60 mL/min   Anion gap 13 5 - 15    Comment: Performed at [facility], [place] [address] [place], [place] [address]  Lipase, blood     Status: None   Collection Time: 07/03/20  8:29 PM  Result Value Ref Range   Lipase 27 11 - 51 U/L    Comment: Performed at [facility], [place] [address] [place], [place] [address]  Protime-INR     Status: Abnormal   Collection Time: 07/03/20 10:43 PM  Result Value Ref Range   Prothrombin Time 44.8 (H) 11.4 - 15.2 seconds   INR 5.0 (HH) 0.8 - 1.2    Comment: CRITICAL RESULT CALLED TO, READ BACK BY AND VERIFIED WITH: PETRUCELLI,SAMANTHA,RN @ 0020 07/04/20 TURNER,S. (NOTE) INR goal varies based on device and disease states. Performed at [facility], [place] [address] [place], [place] [address]   Respiratory Panel by RT PCR (Flu A&B, Covid) - Nasopharyngeal Swab     Status: None   Collection Time: 07/04/20 12:09 AM   Specimen: Nasopharyngeal Swab  Result Value Ref Range   SARS Coronavirus 2 by RT PCR NEGATIVE NEGATIVE    Comment: (NOTE) SARS-CoV-2 target nucleic acids are NOT DETECTED.  The SARS-CoV-2 RNA is generally detectable in upper respiratoy specimens during the acute phase of infection. The lowest concentration of SARS-CoV-2 viral copies this assay can detect is 131 copies/mL. A negative result does not preclude SARS-Cov-2 infection and should not be used as the sole basis for treatment or other patient management decisions. A negative result may occur with  improper specimen collection/handling, submission of specimen other than nasopharyngeal swab, presence of viral mutation(s) within the areas targeted by this assay, and inadequate number of viral copies (<131 copies/mL). A negative result must be combined with clinical observations, patient history, and  epidemiological information. The expected result is Negative.  Fact Sheet for Patients:  PinkCheek.be  Fact Sheet for Healthcare Providers:  GravelBags.it  This test is no t yet approved or cleared by the [place] FDA and  has been authorized for detection and/or diagnosis of SARS-CoV-2 by  FDA under an Emergency Use Authorization (EUA). This EUA will remain  in effect (meaning this test can be used) for the duration of the COVID-19 declaration under Section 564(b)(1) of the Act, 21 U.S.C. section 360bbb-3(b)(1), unless the authorization is terminated or revoked sooner.     Influenza A by PCR NEGATIVE NEGATIVE   Influenza B by PCR NEGATIVE NEGATIVE    Comment: (NOTE) The Xpert Xpress SARS-CoV-2/FLU/RSV assay is intended as an aid in  the diagnosis of influenza from Nasopharyngeal swab specimens and  should not be used as a sole basis for treatment. Nasal washings and  aspirates are unacceptable for Xpert Xpress SARS-CoV-2/FLU/RSV  testing.  Fact Sheet for Patients: PinkCheek.be  Fact Sheet for Healthcare Providers: GravelBags.it  This test is not yet approved or cleared by the [place] FDA and  has been authorized for detection and/or diagnosis of SARS-CoV-2 by  FDA under an Emergency Use Authorization (EUA). This EUA will remain  in effect (meaning this test can be used) for the duration of the  Covid-19 declaration under Section 564(b)(1) of the Act, 21  U.S.C. section 360bbb-3(b)(1), unless the authorization is  terminated or revoked. Performed at [facility], [place] [address], [place], [place] [address]   Protime-INR     Status: Abnormal   Collection Time: 07/04/20  2:19 AM  Result Value Ref Range   Prothrombin Time 18.5 (H) 11.4 - 15.2 seconds   INR 1.6 (H) 0.8 - 1.2    Comment: (NOTE) INR goal varies based on device and  disease states. Performed at [facility], [place] [address], [place], [place] [address]   CBC     Status: Abnormal   Collection Time: 07/04/20  9:56 AM  Result Value Ref Range   WBC 8.0 4.0 - 10.5 K/uL   RBC 3.83 (L) 4.22 - 5.81 MIL/uL   Hemoglobin 12.0 (L) 13.0 - 17.0 g/dL   HCT 35.4 (L) 39 - 52 %   MCV 92.4 80.0 - 100.0 fL   MCH 31.3 26.0 - 34.0 pg   MCHC 33.9 30.0 - 36.0 g/dL   RDW 14.3 11.5 - 15.5 %   Platelets 185 150 - 400 K/uL   nRBC 0.0 0.0 - 0.2 %    Comment: Performed at [facility], [place] [address] [place], [place] [address]  Basic metabolic panel     Status: Abnormal   Collection Time: 07/04/20  9:56 AM  Result Value Ref Range   Sodium 137 135 - 145 mmol/L   Potassium 3.6 3.5 - 5.1 mmol/L   Chloride 104 98 - 111 mmol/L   CO2 23 22 - 32 mmol/L   Glucose, Bld 110 (H) 70 - 99 mg/dL    Comment: Glucose reference range applies only to samples taken after fasting for at least 8 hours.   BUN 17 8 - 23 mg/dL   Creatinine, Ser 0.82 0.61 - 1.24 mg/dL   Calcium 8.1 (L) 8.9 - 10.3 mg/dL   GFR calc non Af Amer >60 >60 mL/min   GFR calc Af Amer >60 >60 mL/min   Anion gap 10 5 - 15    Comment: Performed at [facility], [place] [address] [place], [place] [address]  Protime-INR     Status: None   Collection Time: 07/04/20  9:56 AM  Result Value Ref Range   Prothrombin Time 14.9 11.4 - 15.2 seconds   INR 1.2 0.8 - 1.2    Comment: (NOTE) INR  goal varies based on device and disease states. Performed at [facility], [place] [address] [place], [place] [address]     CT Abdomen Pelvis W Contrast  Result Date: 07/03/2020 CLINICAL DATA:  Right lower quadrant abdominal pain EXAM: CT ABDOMEN AND PELVIS WITH CONTRAST TECHNIQUE: Multidetector CT imaging of the abdomen and pelvis was performed using the standard protocol following bolus administration of intravenous contrast. CONTRAST:  164mL OMNIPAQUE IOHEXOL 300  MG/ML  SOLN COMPARISON:  Contemporary lumbar CT reconstruction, MR lumbar spine 04/03/2013 FINDINGS: Lower chest: Bandlike areas of opacity in the lung bases, likely atelectasis or scarring. No consolidation or effusion is seen. Normal heart size. No pericardial effusion. Hepatobiliary: No worrisome focal liver lesions. Smooth liver surface contour. Normal hepatic attenuation. Physiologic distension of the gallbladder. No calcified gallstones or pericholecystic inflammation. No biliary ductal dilatation. Pancreas: Partial fatty replacement of the pancreas. No pancreatic ductal dilatation or surrounding inflammatory changes. Spleen: Normal in size. No concerning splenic lesions. Adrenals/Urinary Tract: Normal adrenal glands. Kidneys are normally located with symmetric enhancement and excretion. No suspicious renal lesion, urolithiasis or hydronephrosis. Moderate distention of the urinary bladder without other gross abnormality. Stomach/Bowel: Small sliding-type hiatal hernia. Distal stomach is unremarkable. Intermediate attenuation fluid along the duodenal sweep may be redistributed hemorrhage within the retroperitoneum. No focal bowel wall and abnormality in this vicinity. No extraluminal air. No small bowel thickening or dilatation. No colonic dilatation or wall thickening. Trace free fluid is seen in the right paracolic gutter, possibly redistributed as well. A normal appendix is visualized. No colonic dilatation or wall thickening. Vascular/Lymphatic: There are multiple serpiginous blushes of hyperattenuation within the right iliacus musculature concerning for sites of active contrast extravasation. No other sites concerning for active contrast extravasation. No direct vascular injury is identified. Atherosclerotic calcifications within the abdominal aorta and branch vessels. No aneurysm or ectasia. No enlarged abdominopelvic lymph nodes. Reproductive: Prostate appears borderline enlarged with heterogeneous  calcifications though largely obscured by streak artifact from bilateral hip prostheses. Trace right hydrocele is noted. No other acute abnormality of the external genitalia. Other: There is hemorrhage and fluid predominantly throughout the right retroperitoneum which likely arises from the sites of active extravasation in the right iliacus as detailed above. This fluid in hemorrhage does appear to redistributed into the posterior and anterior pararenal space as well as about the duodenum as denoted above. Musculoskeletal: There sites of contrast blush within the right iliacus muscle with additional heterogeneous hemorrhage along the psoas more proximally as well as what is likely a layering intramuscular hemorrhage of the more inferior iliopsoas muscle though partially obscured by adjacent streak artifact from the right hip prosthesis, series 2, image 87. 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 And acetabular component fixation screw in the right stands proud of the superior acetabular cortex (6/113) similarly, a fixation screw in the left closely approximates the cortex along the left ilium (6/100). No other acute or worrisome abnormalities of bony pelvis. Dedicated lumbar reconstructions are generated and dictated separately. Please see report for further details. In brief, there appears to be multilevel discogenic and facet degenerative changes as well as several small vertebral body hemangiomata but without acute osseous injury or suspicious osseous lesion. IMPRESSION: 1. Multiple serpiginous blushes of hyperattenuation within the right iliacus muscle concerning for sites of active contrast extravasation with extensive thickening and intramuscular hemorrhage. Layering intramuscular hematoma is seen within the iliopsoas muscle of the anterior proximal thigh as well as additional retroperitoneal hemorrhage tracking superiorly along the psoas and  distributed throughout the right retroperitoneal  spaces. 2. Hyperdense hemorrhage is present in the retroperitoneum along the duodenal sweep though this is favored to be redistributed without acute abnormality of the bowel or extraluminal gas. 3. Trace intraperitoneal fluid/hemorrhage in the right paracolic gutter and deep pelvis, possibly redistributed as well. 4. No other acute abnormality in the abdomen or pelvis. Specifically, the appendix is normal. 5. 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 6. Dedicated lumbar reconstructions are generated and dictated separately. 7. Aortic Atherosclerosis (ICD10-I70.0). These results were called by telephone at the time of interpretation on 07/03/2020 at 10:56 pm to provider Dr Maryan Rued, who verbally acknowledged these results. Electronically Signed   By: Lovena Le M.D.   On: 07/03/2020 22:57   CT L-SPINE NO CHARGE  Result Date: 07/03/2020 CLINICAL DATA:  Right upper leg pain EXAM: CT Lumbar spine without contrast TECHNIQUE: Multiplanar CT images of the lumbar spine were reconstructed from contemporary CT of the Chest, Abdomen, and Pelvis COMPARISON:  Contemporary CT abdomen pelvis, MR lumbar spine 04/03/2013, radiographs 04/08/2013 FINDINGS: CT LUMBAR SPINE FINDINGS Segmentation: 5 normally formed lumbar type vertebral levels. Lowest fully formed disc space denoted as L5-S1. Partial sacralization of the L5 transverse processes is noted incidentally, benign variant. Alignment: Straightening of the normal lumbar lordosis. No significant spondylolisthesis or spondylolysis. No abnormally widened, perched or jumped facets. Vertebrae: No acute fracture or vertebral body height loss is evident. Benign vertebral body hemangiomata are present in the T12, L2 and L3 levels, seen on comparison MRI. No worrisome lytic or blastic lesions are seen. Some fairly prominent Schmorl's node formations are noted in the anterior inferior endplate L4, at least several of which  are new from comparison in 2014. 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 Some predominantly anterior endplate spurring is noted. Multilevel discogenic and facet degenerative changes are present, further detailed below. 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 Paraspinal and other soft tissues: No perispinal fluid, swelling, gas or hemorrhage. No visible canal hematoma. Retroperitoneal hemorrhage centered within the right iliacus at a site of active contrast extravasation is better detailed on contemporary CT of the abdomen and pelvis, dictated separately. Aortoiliac atherosclerosis noted as well. For additional findings in the abdomen and pelvis, please see dedicated CT from which this study is reconstructed. Disc levels: Level by level evaluation of the lumbar spine below: T11-T12: Mild disc height loss without posterior disc abnormality. Mild bilateral facet arthropathy. No significant spinal canal or foraminal stenosis. T12-L1: Mild disc height loss without significant posterior disc abnormality. Mild bilateral facet arthropathy. No significant spinal canal or foraminal stenosis. L1-L2: Mild to moderate disc height loss with global disc bulge and mild bilateral facet arthropathy. At most mild canal stenosis and partial effacement of the lateral recesses. No significant foraminal narrowing. L2-L3: Moderate disc height loss with asymmetric global disc bulge eccentric to the left subarticular zone with moderate bilateral facet arthropathy. Mild canal stenosis and near complete effacement of the left lateral recess. No significant foraminal impingement. L3-L4: Moderate disc height loss with global disc bulge and a superimposed inferiorly directed disc extrusion extending 6 mm below the disc space. Mild canal stenosis and some asymmetric effacement of the right lateral recess. No significant foraminal impingement. L4-L5: Moderate to severe disc  height loss with disc desiccation and vacuum disc phenomenon as well as a global disc bulge asymmetric to the right subarticular zone with central disc protrusion and moderate to severe  bilateral facet arthropathy resulting and some moderate canal stenosis, severe lateral recess narrowing, and moderate bilateral foraminal narrowing, left slightly greater than right. L5-S1: Severe disc height loss, disc desiccation and vacuum disc with Modic type endplate changes as well as a global disc bulge and severe bilateral facet arthropathy. Findings result in some mild canal stenosis and effacement of the lateral recesses as well as moderate to severe bilateral foraminal narrowing. IMPRESSION: 1. No acute fracture or vertebral body height loss identified. 2. Benign vertebral body hemangiomata T12, L2 and L3. 3. Multilevel degenerative changes of the lumbar spine as described level by level above. Features maximal L4-S1. Stenotic changes are likely accentuated by what appear to be congenitally shortened pedicles. 4. Interspinous arthrosis compatible with Baastrup's disease. 5. Retroperitoneal hemorrhage centered within the right iliacus in distributed throughout the retroperitoneum better detailed on dedicated CT from which this study is reconstructed. Electronically Signed   By: Lovena Le M.D.   On: 07/03/2020 23:11   VAS [place] LOWER EXTREMITY VENOUS (DVT) (ONLY [facility] & [facility])  Result Date: 07/03/2020  Lower Venous DVTStudy Indications: Pain in right thigh.  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 Comparison Study: 04-10-2013 LT lower extremity study available. Performing Technologist: Darlin Coco  Examination Guidelines: A complete evaluation includes B-mode imaging, spectral Doppler, color Doppler, and power Doppler as needed of all accessible portions of each vessel. Bilateral testing is considered an integral part of a complete examination. Limited examinations for reoccurring indications may be performed as  noted. The reflux portion of the exam is performed with the patient in reverse Trendelenburg.  +---------+---------------+---------+-----------+----------+------------------+ RIGHT    CompressibilityPhasicitySpontaneityPropertiesThrombus Aging     +---------+---------------+---------+-----------+----------+------------------+ CFV      Full           Yes      Yes                                     +---------+---------------+---------+-----------+----------+------------------+ SFJ      Full                                                            +---------+---------------+---------+-----------+----------+------------------+ FV Prox  Full                                                            +---------+---------------+---------+-----------+----------+------------------+ FV Mid   Full                                                            +---------+---------------+---------+-----------+----------+------------------+ FV DistalFull                                                            +---------+---------------+---------+-----------+----------+------------------+  PFV      Full                                                            +---------+---------------+---------+-----------+----------+------------------+ POP      Full           Yes      Yes                  Fibrin stranding                                                         noted              +---------+---------------+---------+-----------+----------+------------------+ PTV      Full                                                            +---------+---------------+---------+-----------+----------+------------------+ PERO     Full                                                            +---------+---------------+---------+-----------+----------+------------------+   +----+---------------+---------+-----------+----------+--------------+  LEFTCompressibilityPhasicitySpontaneityPropertiesThrombus Aging +----+---------------+---------+-----------+----------+--------------+ CFV Full           Yes      Yes                                 +----+---------------+---------+-----------+----------+--------------+     Summary: RIGHT: - There is no evidence of deep vein thrombosis in the lower extremity.  - No cystic structure found in the popliteal fossa.  LEFT: - No evidence of common femoral vein obstruction.  *See table(s) above for measurements and observations.    Preliminary     Review of Systems  Constitutional: Negative for chills and fever.  HENT: Negative for ear discharge, hearing loss and sore throat.   Eyes: Negative for discharge.  Respiratory: Negative for cough and shortness of breath.   Cardiovascular: Negative for chest pain and leg swelling.  Gastrointestinal: Negative for abdominal pain, constipation, diarrhea, nausea and vomiting.  Musculoskeletal: Negative for myalgias and neck pain.  Skin: Negative for rash.  Allergic/Immunologic: Negative for environmental allergies.  Neurological: Negative for dizziness and seizures.  Hematological: Does not bruise/bleed easily.  Psychiatric/Behavioral: Negative for suicidal ideas.  All other systems reviewed and are negative.  Blood pressure 113/63, pulse 84, temperature (!) 100.8 F (38.2 C), temperature source Oral, resp. rate 14, SpO2 95 %. Physical Exam Constitutional:      Appearance: He is well-developed.     Comments: Conversant No acute distress  Eyes:     General: Lids are normal. No scleral icterus.    Comments: Pupils are equal round and reactive No lid lag Moist  conjunctiva  Neck:     Thyroid: No thyromegaly.     Trachea: No tracheal tenderness.     Comments: No cervical lymphadenopathy Cardiovascular:     Rate and Rhythm: Normal rate and regular rhythm.     Heart sounds: No murmur heard.   Pulmonary:     Effort: Pulmonary effort is  normal.     Breath sounds: Normal breath sounds. No wheezing or rales.  Abdominal:     Tenderness: There is no abdominal tenderness.     Hernia: No hernia is present.  Musculoskeletal:        General: Normal range of motion.     Comments: Pain with hip flexion  Skin:    General: Skin is warm.     Findings: No rash.     Nails: There is no clubbing.     Comments: Normal skin turgor  Neurological:     Mental Status: He is alert and oriented to person, place, and time.     Comments: Normal gait and station  Psychiatric:        Judgment: Judgment normal.     Comments: Appropriate affect     Assessment/Plan: 79 year old male with a retroperitoneal hematoma Supratherapeutic INR History of pulmonary embolus  1.  At this time agree with reversal of his INR which has been done.  No surgical plans at this point. 2.  Okay with p.o. diet.  If patient's H&H stays stable over the next 24 to 48 hours patient okay for DC.  Ralene Ok 07/04/2020, 11:52 AM

## 2020-07-04 NOTE — Assessment & Plan Note (Addendum)
-   CT abdomen/pelvis revealed right iliacus muscle noted with thickening and intramuscular hemorrhage as well as hematoma seen within the iliopsoas muscle and retroperitoneal hemorrhage tracking along the psoas and distributed throughout the right retroperitoneal spaces.  There was also trace intraperitoneal fluid/hemorrhage in the right paracolic gutter/deep pelvis. - surgery consulted, appreciate assistance; patient has no surgical needs and remains stable. Surgery signing off due to stability  - Hgb stable.

## 2020-07-05 DIAGNOSIS — Z86711 Personal history of pulmonary embolism: Secondary | ICD-10-CM

## 2020-07-05 DIAGNOSIS — S36892A Contusion of other intra-abdominal organs, initial encounter: Secondary | ICD-10-CM

## 2020-07-05 LAB — CBC WITH DIFFERENTIAL/PLATELET
Abs Immature Granulocytes: 0.03 10*3/uL (ref 0.00–0.07)
Basophils Absolute: 0 10*3/uL (ref 0.0–0.1)
Basophils Relative: 0 %
Eosinophils Absolute: 0.3 10*3/uL (ref 0.0–0.5)
Eosinophils Relative: 3 %
HCT: 38.6 % — ABNORMAL LOW (ref 39.0–52.0)
Hemoglobin: 12.6 g/dL — ABNORMAL LOW (ref 13.0–17.0)
Immature Granulocytes: 0 %
Lymphocytes Relative: 13 %
Lymphs Abs: 1.2 10*3/uL (ref 0.7–4.0)
MCH: 30.7 pg (ref 26.0–34.0)
MCHC: 32.6 g/dL (ref 30.0–36.0)
MCV: 94.1 fL (ref 80.0–100.0)
Monocytes Absolute: 0.9 10*3/uL (ref 0.1–1.0)
Monocytes Relative: 10 %
Neutro Abs: 7.1 10*3/uL (ref 1.7–7.7)
Neutrophils Relative %: 74 %
Platelets: 222 10*3/uL (ref 150–400)
RBC: 4.1 MIL/uL — ABNORMAL LOW (ref 4.22–5.81)
RDW: 14.1 % (ref 11.5–15.5)
WBC: 9.5 10*3/uL (ref 4.0–10.5)
nRBC: 0 % (ref 0.0–0.2)

## 2020-07-05 LAB — PROTIME-INR
INR: 1.1 (ref 0.8–1.2)
Prothrombin Time: 14 seconds (ref 11.4–15.2)

## 2020-07-05 LAB — BASIC METABOLIC PANEL
Anion gap: 10 (ref 5–15)
BUN: 19 mg/dL (ref 8–23)
CO2: 27 mmol/L (ref 22–32)
Calcium: 8.6 mg/dL — ABNORMAL LOW (ref 8.9–10.3)
Chloride: 103 mmol/L (ref 98–111)
Creatinine, Ser: 1.03 mg/dL (ref 0.61–1.24)
GFR calc Af Amer: >60 mL/min (ref >60)
GFR calc non Af Amer: >60 mL/min (ref >60)
Glucose, Bld: 122 mg/dL — ABNORMAL HIGH (ref 70–99)
Potassium: 3.7 mmol/L (ref 3.5–5.1)
Sodium: 140 mmol/L (ref 135–145)

## 2020-07-05 LAB — MAGNESIUM: Magnesium: 2.1 mg/dL (ref 1.7–2.4)

## 2020-07-05 MED ORDER — CYCLOBENZAPRINE HCL 5 MG PO TABS
5.0000 mg | ORAL_TABLET | Freq: Three times a day (TID) | ORAL | Status: DC | PRN
  Administered 2020-07-05 – 2020-07-06 (×3): 5 mg via ORAL
  Filled 2020-07-05 (×3): qty 1

## 2020-07-05 NOTE — Progress Notes (Signed)
PROGRESS NOTE    Paul Luna   DXA:[id number]  DOB: January 07, 1941  DOA: 07/03/2020     1  PCP: Lilian Coma., MD  CC: right thigh pain   Hospital Course: Paul Luna is a 79 yo CM with PMH DVT/PE on chronic Coumadin (since 2010), UC, HTN, arthritis who presented with right thigh pain up to his groin.  He was seen at urgent care with similar complaints on 07/02/2020 and discharged home with pain prescription.  He returned for ongoing symptoms.  He underwent right lower extremity duplex on admission which was negative for DVT. He then underwent CT abdomen/pelvis which revealed right iliacus muscle noted with thickening and intramuscular hemorrhage as well as hematoma seen within the iliopsoas muscle and retroperitoneal hemorrhage tracking along the psoas and distributed throughout the right retroperitoneal spaces.  There was also trace intraperitoneal fluid/hemorrhage in the right paracolic gutter/deep pelvis.  His INR on admission was 5 and he underwent reversal with [facility] and vitamin K 10 mg x 1.  Repeat INR was 1.6 and remained reversed with trending.  He was hemodynamically stable.  Hemoglobin was also trended on admission.  Due to his CT findings, surgery was also consulted for further recommendations. Hematology was consulted regarding further recommendations regarding resuming AC vs alternative drug choices if needed as well. His INR remained stable and his clinical status did not worsen. PT was consulted for evaluating patient's mobility status given his RLE pain interfering with his ability to ambulate well.    Interval History:  Patient admitted with ongoing right lower extremity pain at home.   No acute events overnight; still having RLE pain but no worse at least than yesterday.  He is worried about ambulation and understands plan is to work with physical therapy some today.  Otherwise he is feeling okay with no other issues at this time.  Old records reviewed in assessment of this  patient  ROS: Constitutional: negative for chills and fevers, Respiratory: negative for cough, Cardiovascular: negative for chest pain and Gastrointestinal: negative for abdominal pain  Assessment & Plan: * Psoas hematoma, right, secondary to anticoagulant therapy, initial encounter - Patient has pain extending down into right thigh. Currently no s/s compartment syndrome - continue to monitor for any developing signs of compartment syndrome in RLE - PT eval as pain is limiting mobility - flexeril added per hematology; will follow up response to this   Retroperitoneal hematoma - CT abdomen/pelvis revealed right iliacus muscle noted with thickening and intramuscular hemorrhage as well as hematoma seen within the iliopsoas muscle and retroperitoneal hemorrhage tracking along the psoas and distributed throughout the right retroperitoneal spaces.  There was also trace intraperitoneal fluid/hemorrhage in the right paracolic gutter/deep pelvis. - surgery consulted, appreciate assistance; patient has surgical needs and remains stable. Surgery signing off due to stability  - Hgb stable.   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 INR has been reversed with [place] and Vit K on admission    Antimicrobials: none  DVT prophylaxis: SCD Code Status: full Family Communication: none present Disposition Plan: Status is: Inpatient  Remains inpatient appropriate because:Ongoing active pain requiring inpatient pain management, Unsafe d/c plan and Inpatient  level of care appropriate due to severity of illness   Dispo: The patient is from: Home               Anticipated d/c is to: pending PT eval              Anticipated d/c date is: 2 days              Patient currently is not medically stable to d/c.  Objective: Blood pressure 133/75, pulse 90, temperature 99.1 F (37.3 C), resp. rate 17, height 6\' 1"  (1.854 m), weight 96.2 kg, SpO2 (!) 88 %.  Examination: General appearance: alert, cooperative and no distress Head: Normocephalic, without obvious abnormality, atraumatic Eyes: EOMI Lungs: clear to auscultation bilaterally Heart: regular rate and rhythm and S1, S2 normal Abdomen: normal findings: bowel sounds normal and soft, non-tender Extremities: RLE is TTP worst in thigh; compartment is soft with no signs of compartment. Skin: mobility and turgor normal Neurologic: paresthesia better in RLE, no true weakness and no obvious focal deficits  Consultants:   Surgery  Hematology  Procedures:   none  Data Reviewed: I have personally reviewed following labs and imaging studies Results for orders placed or performed during the hospital encounter of 07/03/20 (from the past 24 hour(s))  Protime-INR     Status: None   Collection Time: 07/05/20  3:12 AM  Result Value Ref Range   Prothrombin Time 14.0 11.4 - 15.2 seconds   INR 1.1 0.8 - 1.2  Basic metabolic panel     Status: Abnormal   Collection Time: 07/05/20  3:12 AM  Result Value Ref Range   Sodium 140 135 - 145 mmol/L   Potassium 3.7 3.5 - 5.1 mmol/L   Chloride 103 98 - 111 mmol/L   CO2 27 22 - 32 mmol/L   Glucose, Bld 122 (H) 70 - 99 mg/dL   BUN 19 8 - 23 mg/dL   Creatinine, Ser 1.03 0.61 - 1.24 mg/dL   Calcium 8.6 (L) 8.9 - 10.3 mg/dL   GFR calc non Af Amer >60 >60 mL/min   GFR calc Af Amer >60 >60 mL/min   Anion gap 10 5 - 15  CBC with Differential/Platelet     Status: Abnormal   Collection Time: 07/05/20  3:12 AM  Result Value Ref Range   WBC 9.5 4.0 - 10.5 K/uL   RBC 4.10 (L) 4.22 - 5.81 MIL/uL   Hemoglobin 12.6 (L) 13.0 - 17.0 g/dL   HCT 38.6 (L) 39 - 52 %    MCV 94.1 80.0 - 100.0 fL   MCH 30.7 26.0 - 34.0 pg   MCHC 32.6 30.0 - 36.0 g/dL   RDW 14.1 11.5 - 15.5 %   Platelets 222 150 - 400 K/uL   nRBC 0.0 0.0 - 0.2 %   Neutrophils Relative % 74 %   Neutro Abs 7.1 1.7 - 7.7 K/uL   Lymphocytes Relative 13 %   Lymphs Abs 1.2 0.7 - 4.0 K/uL   Monocytes Relative 10 %   Monocytes Absolute 0.9 0 - 1 K/uL   Eosinophils Relative 3 %   Eosinophils Absolute 0.3 0 - 0 K/uL   Basophils Relative 0 %   Basophils Absolute 0.0 0 - 0 K/uL   Immature Granulocytes 0 %   Abs Immature Granulocytes 0.03 0.00 - 0.07 K/uL  Magnesium     Status: None   Collection Time: 07/05/20  3:12 AM  Result Value Ref Range   Magnesium 2.1 1.7 -  2.4 mg/dL    Recent Results (from the past 240 hour(s))  Respiratory Panel by RT PCR (Flu A&B, Covid) - Nasopharyngeal Swab     Status: None   Collection Time: 07/04/20 12:09 AM   Specimen: Nasopharyngeal Swab  Result Value Ref Range Status   SARS Coronavirus 2 by RT PCR NEGATIVE NEGATIVE Final    Comment: (NOTE) SARS-CoV-2 target nucleic acids are NOT DETECTED.  The SARS-CoV-2 RNA is generally detectable in upper respiratoy specimens during the acute phase of infection. The lowest concentration of SARS-CoV-2 viral copies this assay can detect is 131 copies/mL. A negative result does not preclude SARS-Cov-2 infection and should not be used as the sole basis for treatment or other patient management decisions. A negative result may occur with  improper specimen collection/handling, submission of specimen other than nasopharyngeal swab, presence of viral mutation(s) within the areas targeted by this assay, and inadequate number of viral copies (<131 copies/mL). A negative result must be combined with clinical observations, patient history, and epidemiological information. The expected result is Negative.  Fact Sheet for Patients:  PinkCheek.be  Fact Sheet for Healthcare Providers:    GravelBags.it  This test is no t yet approved or cleared by the [place] FDA and  has been authorized for detection and/or diagnosis of SARS-CoV-2 by FDA under an Emergency Use Authorization (EUA). This EUA will remain  in effect (meaning this test can be used) for the duration of the COVID-19 declaration under Section 564(b)(1) of the Act, 21 U.S.C. section 360bbb-3(b)(1), unless the authorization is terminated or revoked sooner.     Influenza A by PCR NEGATIVE NEGATIVE Final   Influenza B by PCR NEGATIVE NEGATIVE Final    Comment: (NOTE) The Xpert Xpress SARS-CoV-2/FLU/RSV assay is intended as an aid in  the diagnosis of influenza from Nasopharyngeal swab specimens and  should not be used as a sole basis for treatment. Nasal washings and  aspirates are unacceptable for Xpert Xpress SARS-CoV-2/FLU/RSV  testing.  Fact Sheet for Patients: PinkCheek.be  Fact Sheet for Healthcare Providers: GravelBags.it  This test is not yet approved or cleared by the [place] FDA and  has been authorized for detection and/or diagnosis of SARS-CoV-2 by  FDA under an Emergency Use Authorization (EUA). This EUA will remain  in effect (meaning this test can be used) for the duration of the  Covid-19 declaration under Section 564(b)(1) of the Act, 21  U.S.C. section 360bbb-3(b)(1), unless the authorization is  terminated or revoked. Performed at [facility], [place] [address] [place], [place] [address]      Radiology Studies: CT Abdomen Pelvis W Contrast  Result Date: 07/03/2020 CLINICAL DATA:  Right lower quadrant abdominal pain EXAM: CT ABDOMEN AND PELVIS WITH CONTRAST TECHNIQUE: Multidetector CT imaging of the abdomen and pelvis was performed using the standard protocol following bolus administration of intravenous contrast. CONTRAST:  179mL OMNIPAQUE IOHEXOL 300 MG/ML  SOLN  COMPARISON:  Contemporary lumbar CT reconstruction, MR lumbar spine 04/03/2013 FINDINGS: Lower chest: Bandlike areas of opacity in the lung bases, likely atelectasis or scarring. No consolidation or effusion is seen. Normal heart size. No pericardial effusion. Hepatobiliary: No worrisome focal liver lesions. Smooth liver surface contour. Normal hepatic attenuation. Physiologic distension of the gallbladder. No calcified gallstones or pericholecystic inflammation. No biliary ductal dilatation. Pancreas: Partial fatty replacement of the pancreas. No pancreatic ductal dilatation or surrounding inflammatory changes. Spleen: Normal in size. No concerning splenic lesions. Adrenals/Urinary Tract: Normal adrenal glands. Kidneys are normally located with  symmetric enhancement and excretion. No suspicious renal lesion, urolithiasis or hydronephrosis. Moderate distention of the urinary bladder without other gross abnormality. Stomach/Bowel: Small sliding-type hiatal hernia. Distal stomach is unremarkable. Intermediate attenuation fluid along the duodenal sweep may be redistributed hemorrhage within the retroperitoneum. No focal bowel wall and abnormality in this vicinity. No extraluminal air. No small bowel thickening or dilatation. No colonic dilatation or wall thickening. Trace free fluid is seen in the right paracolic gutter, possibly redistributed as well. A normal appendix is visualized. No colonic dilatation or wall thickening. Vascular/Lymphatic: There are multiple serpiginous blushes of hyperattenuation within the right iliacus musculature concerning for sites of active contrast extravasation. No other sites concerning for active contrast extravasation. No direct vascular injury is identified. Atherosclerotic calcifications within the abdominal aorta and branch vessels. No aneurysm or ectasia. No enlarged abdominopelvic lymph nodes. Reproductive: Prostate appears borderline enlarged with heterogeneous calcifications  though largely obscured by streak artifact from bilateral hip prostheses. Trace right hydrocele is noted. No other acute abnormality of the external genitalia. Other: There is hemorrhage and fluid predominantly throughout the right retroperitoneum which likely arises from the sites of active extravasation in the right iliacus as detailed above. This fluid in hemorrhage does appear to redistributed into the posterior and anterior pararenal space as well as about the duodenum as denoted above. Musculoskeletal: There sites of contrast blush within the right iliacus muscle with additional heterogeneous hemorrhage along the psoas more proximally as well as what is likely a layering intramuscular hemorrhage of the more inferior iliopsoas muscle though partially obscured by adjacent streak artifact from the right hip prosthesis, series 2, image 87. 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 And acetabular component fixation screw in the right stands proud of the superior acetabular cortex (6/113) similarly, a fixation screw in the left closely approximates the cortex along the left ilium (6/100). No other acute or worrisome abnormalities of bony pelvis. Dedicated lumbar reconstructions are generated and dictated separately. Please see report for further details. In brief, there appears to be multilevel discogenic and facet degenerative changes as well as several small vertebral body hemangiomata but without acute osseous injury or suspicious osseous lesion. IMPRESSION: 1. Multiple serpiginous blushes of hyperattenuation within the right iliacus muscle concerning for sites of active contrast extravasation with extensive thickening and intramuscular hemorrhage. Layering intramuscular hematoma is seen within the iliopsoas muscle of the anterior proximal thigh as well as additional retroperitoneal hemorrhage tracking superiorly along the psoas and distributed throughout the right retroperitoneal spaces. 2.  Hyperdense hemorrhage is present in the retroperitoneum along the duodenal sweep though this is favored to be redistributed without acute abnormality of the bowel or extraluminal gas. 3. Trace intraperitoneal fluid/hemorrhage in the right paracolic gutter and deep pelvis, possibly redistributed as well. 4. No other acute abnormality in the abdomen or pelvis. Specifically, the appendix is normal. 5. 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 6. Dedicated lumbar reconstructions are generated and dictated separately. 7. Aortic Atherosclerosis (ICD10-I70.0). These results were called by telephone at the time of interpretation on 07/03/2020 at 10:56 pm to provider Dr Maryan Rued, who verbally acknowledged these results. Electronically Signed   By: Lovena Le M.D.   On: 07/03/2020 22:57   CT L-SPINE NO CHARGE  Result Date: 07/03/2020 CLINICAL DATA:  Right upper leg pain EXAM: CT Lumbar spine without contrast TECHNIQUE: Multiplanar CT images of the lumbar spine were reconstructed from contemporary CT of the Chest, Abdomen, and Pelvis COMPARISON:  Contemporary  CT abdomen pelvis, MR lumbar spine 04/03/2013, radiographs 04/08/2013 FINDINGS: CT LUMBAR SPINE FINDINGS Segmentation: 5 normally formed lumbar type vertebral levels. Lowest fully formed disc space denoted as L5-S1. Partial sacralization of the L5 transverse processes is noted incidentally, benign variant. Alignment: Straightening of the normal lumbar lordosis. No significant spondylolisthesis or spondylolysis. No abnormally widened, perched or jumped facets. Vertebrae: No acute fracture or vertebral body height loss is evident. Benign vertebral body hemangiomata are present in the T12, L2 and L3 levels, seen on comparison MRI. No worrisome lytic or blastic lesions are seen. Some fairly prominent Schmorl's node formations are noted in the anterior inferior endplate L4, at least several of which are new from  comparison in 2014. 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 Some predominantly anterior endplate spurring is noted. Multilevel discogenic and facet degenerative changes are present, further detailed below. 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 Paraspinal and other soft tissues: No perispinal fluid, swelling, gas or hemorrhage. No visible canal hematoma. Retroperitoneal hemorrhage centered within the right iliacus at a site of active contrast extravasation is better detailed on contemporary CT of the abdomen and pelvis, dictated separately. Aortoiliac atherosclerosis noted as well. For additional findings in the abdomen and pelvis, please see dedicated CT from which this study is reconstructed. Disc levels: Level by level evaluation of the lumbar spine below: T11-T12: Mild disc height loss without posterior disc abnormality. Mild bilateral facet arthropathy. No significant spinal canal or foraminal stenosis. T12-L1: Mild disc height loss without significant posterior disc abnormality. Mild bilateral facet arthropathy. No significant spinal canal or foraminal stenosis. L1-L2: Mild to moderate disc height loss with global disc bulge and mild bilateral facet arthropathy. At most mild canal stenosis and partial effacement of the lateral recesses. No significant foraminal narrowing. L2-L3: Moderate disc height loss with asymmetric global disc bulge eccentric to the left subarticular zone with moderate bilateral facet arthropathy. Mild canal stenosis and near complete effacement of the left lateral recess. No significant foraminal impingement. L3-L4: Moderate disc height loss with global disc bulge and a superimposed inferiorly directed disc extrusion extending 6 mm below the disc space. Mild canal stenosis and some asymmetric effacement of the right lateral recess. No significant foraminal impingement. L4-L5: Moderate to severe disc height loss  with disc desiccation and vacuum disc phenomenon as well as a global disc bulge asymmetric to the right subarticular zone with central disc protrusion and moderate to severe bilateral facet arthropathy resulting and some moderate canal stenosis, severe lateral recess narrowing, and moderate bilateral foraminal narrowing, left slightly greater than right. L5-S1: Severe disc height loss, disc desiccation and vacuum disc with Modic type endplate changes as well as a global disc bulge and severe bilateral facet arthropathy. Findings result in some mild canal stenosis and effacement of the lateral recesses as well as moderate to severe bilateral foraminal narrowing. IMPRESSION: 1. No acute fracture or vertebral body height loss identified. 2. Benign vertebral body hemangiomata T12, L2 and L3. 3. Multilevel degenerative changes of the lumbar spine as described level by level above. Features maximal L4-S1. Stenotic changes are likely accentuated by what appear to be congenitally shortened pedicles. 4. Interspinous arthrosis compatible with Baastrup's disease. 5. Retroperitoneal hemorrhage centered within the right iliacus in distributed throughout the retroperitoneum better detailed on dedicated CT from which this study is reconstructed. Electronically Signed   By: Lovena Le M.D.   On: 07/03/2020 23:11   VAS [place] LOWER EXTREMITY VENOUS (DVT) (ONLY [facility] &  [facility])  Result Date: 07/04/2020  Lower Venous DVTStudy Indications: Pain in right thigh.  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 Comparison Study: 04-10-2013 LT lower extremity study available. Performing Technologist: Darlin Coco  Examination Guidelines: A complete evaluation includes B-mode imaging, spectral Doppler, color Doppler, and power Doppler as needed of all accessible portions of each vessel. Bilateral testing is considered an integral part of a complete examination. Limited examinations for reoccurring indications may be performed as noted. The  reflux portion of the exam is performed with the patient in reverse Trendelenburg.  +---------+---------------+---------+-----------+----------+------------------+ RIGHT    CompressibilityPhasicitySpontaneityPropertiesThrombus Aging     +---------+---------------+---------+-----------+----------+------------------+ CFV      Full           Yes      Yes                                     +---------+---------------+---------+-----------+----------+------------------+ SFJ      Full                                                            +---------+---------------+---------+-----------+----------+------------------+ FV Prox  Full                                                            +---------+---------------+---------+-----------+----------+------------------+ FV Mid   Full                                                            +---------+---------------+---------+-----------+----------+------------------+ FV DistalFull                                                            +---------+---------------+---------+-----------+----------+------------------+ PFV      Full                                                            +---------+---------------+---------+-----------+----------+------------------+ POP      Full           Yes      Yes                  Fibrin stranding                                                         noted              +---------+---------------+---------+-----------+----------+------------------+  PTV      Full                                                            +---------+---------------+---------+-----------+----------+------------------+ PERO     Full                                                            +---------+---------------+---------+-----------+----------+------------------+   +----+---------------+---------+-----------+----------+--------------+  LEFTCompressibilityPhasicitySpontaneityPropertiesThrombus Aging +----+---------------+---------+-----------+----------+--------------+ CFV Full           Yes      Yes                                 +----+---------------+---------+-----------+----------+--------------+     Summary: RIGHT: - There is no evidence of deep vein thrombosis in the lower extremity.  - No cystic structure found in the popliteal fossa.  LEFT: - No evidence of common femoral vein obstruction.  *See table(s) above for measurements and observations. Electronically signed by Harold Barban MD on 07/04/2020 at 1:09:53 PM.    Final    CT Abdomen Pelvis W Contrast  Final Result    CT L-SPINE NO CHARGE  Final Result    VAS [place] LOWER EXTREMITY VENOUS (DVT) (ONLY [facility] & [facility])  Final Result      Scheduled Meds: . budesonide  9 mg Oral BID  . ezetimibe  10 mg Oral Daily  . sertraline  200 mg Oral Daily  . simvastatin  40 mg Oral Daily   PRN Meds: acetaminophen **OR** acetaminophen, cyclobenzaprine, HYDROmorphone (DILAUDID) injection, ondansetron **OR** ondansetron (ZOFRAN) IV Continuous Infusions:    LOS: 1 day  Time spent: Greater than 50% of the 35 minute visit was spent in counseling/coordination of care for the patient as laid out in the A&P.   Dwyane Dee, MD [facility] 07/05/2020, 1:05 PM  Contact via secure chat.  To contact the attending provider between 7A-7P or the covering provider during after hours 7P-7A, please log into the web site [email or web address] and access using universal [place] password for that web site. If you do not have the password, please call the hospital operator.

## 2020-07-05 NOTE — Consult Note (Signed)
[facility] NOTE  Patient Care Team: Lilian Coma., MD as PCP - General (Internal Medicine) Calvert Cantor, MD as Consulting Physician (Ophthalmology) Earnie Larsson, MD as Consulting Physician (Neurosurgery) Everardo Pacific, MD (Urology) Laurence Spates, MD (Inactive) as Consulting Physician (Gastroenterology)  CHIEF COMPLAINTS/PURPOSE OF CONSULTATION:  Retroperitoneal bleed  HISTORY OF PRESENTING ILLNESS:  Paul Luna 79 y.o. male is  admitted with retroperitoneal bleed with a supratherapeutic INR.  Vitamin K was given and his INR has come down.  His major complaint today is intense and intractable pain in his right thigh.  It is to the point that he cannot even move the leg.  Even gentle touch on the skin feels excruciating.  This is most likely secondary to the hematoma in his right thigh.  I reviewed her records extensively and collaborated the history with the patient.   MEDICAL HISTORY:  Past Medical History:  Diagnosis Date  . Adenomatous colon polyp   . Anxiety    chronic  . Arthritis   . Bladder cancer ([place]) 1991  . Clotting disorder ([place]) 2017   following hip surgery  . HLD (hyperlipidemia)   . Hypertension   . UC (ulcerative colitis) ([place])     SURGICAL HISTORY: Past Surgical History:  Procedure Laterality Date  . bladder cancer surgery  1991   managed by urologist  . COLONOSCOPY  2021   Hx of many colon's; last 6 months ago, todays is a follow up to the   . COLONOSCOPY W/ BIOPSIES AND POLYPECTOMY     Hx: of  . LUMBAR LAMINECTOMY/DECOMPRESSION MICRODISCECTOMY Right 04/08/2013   Procedure: LUMBAR FOUR-FIVE LUMBAR LAMINECTOMY/DECOMPRESSION MICRODISCECTOMY 1 LEVEL;  Surgeon: Charlie Pitter, MD;  Location: [facility];  Service: Neurosurgery;  Laterality: Right;  . TOTAL HIP ARTHROPLASTY Right   . TOTAL HIP ARTHROPLASTY Left     SOCIAL HISTORY: Social History   Socioeconomic History  . Marital status: Married    Spouse name: Not on file   . Number of children: 2  . Years of education: Not on file  . Highest education level: Not on file  Occupational History  . Occupation: retired  Tobacco Use  . Smoking status: Former Smoker    Quit date: 10/03/1989    Years since quitting: 30.7  . Smokeless tobacco: Never Used  Vaping Use  . Vaping Use: Never used  Substance and Sexual Activity  . Alcohol use: Yes    Comment: 1 glass a week  . Drug use: No  . Sexual activity: Yes    Partners: Male    Birth control/protection: None  Other Topics Concern  . Not on file  Social History Narrative   From [place], [place]   Social Determinants of Health   Financial Resource Strain:   . Difficulty of Paying Living Expenses: Not on file  Food Insecurity:   . Worried About Charity fundraiser in the Last Year: Not on file  . Ran Out of Food in the Last Year: Not on file  Transportation Needs:   . Lack of Transportation (Medical): Not on file  . Lack of Transportation (Non-Medical): Not on file  Physical Activity:   . Days of Exercise per Week: Not on file  . Minutes of Exercise per Session: Not on file  Stress:   . Feeling of Stress : Not on file  Social Connections:   . Frequency of Communication with Friends and Family: Not on file  . Frequency of Social Gatherings  with Friends and Family: Not on file  . Attends Religious Services: Not on file  . Active Member of Clubs or Organizations: Not on file  . Attends Archivist Meetings: Not on file  . Marital Status: Not on file  Intimate Partner Violence:   . Fear of Current or Ex-Partner: Not on file  . Emotionally Abused: Not on file  . Physically Abused: Not on file  . Sexually Abused: Not on file    FAMILY HISTORY: Family History  Problem Relation Age of Onset  . Hypertension Mother   . Heart disease Mother 61  . Hypertension Father   . Arthritis Father   . Stroke Father 60  . Colon cancer Brother 10  . Colon polyps Brother   . Heart disease Brother 55   . Esophageal cancer Neg Hx   . Rectal cancer Neg Hx   . Stomach cancer Neg Hx     ALLERGIES:  has No Known Allergies.  MEDICATIONS:  Current Facility-Administered Medications  Medication Dose Route Frequency Provider Last Rate Last Admin  . acetaminophen (TYLENOL) tablet 650 mg  650 mg Oral Q6H PRN Etta Quill, DO       Or  . acetaminophen (TYLENOL) suppository 650 mg  650 mg Rectal Q6H PRN Etta Quill, DO      . budesonide (ENTOCORT EC) 24 hr capsule 9 mg  9 mg Oral BID Jennette Kettle M, DO   9 mg at 07/05/20 0834  . cyclobenzaprine (FLEXERIL) tablet 5 mg  5 mg Oral TID PRN Nicholas Lose, MD      . ezetimibe (ZETIA) tablet 10 mg  10 mg Oral Daily Jennette Kettle M, DO   10 mg at 07/05/20 0835  . HYDROmorphone (DILAUDID) injection 0.5-1 mg  0.5-1 mg Intravenous Q2H PRN Etta Quill, DO   1 mg at 07/05/20 0839  . ondansetron (ZOFRAN) tablet 4 mg  4 mg Oral Q6H PRN Etta Quill, DO       Or  . ondansetron [facility]) injection 4 mg  4 mg Intravenous Q6H PRN Etta Quill, DO      . sertraline (ZOLOFT) tablet 200 mg  200 mg Oral Daily Jennette Kettle M, DO   200 mg at 07/05/20 0834  . simvastatin (ZOCOR) tablet 40 mg  40 mg Oral Daily Jennette Kettle M, DO   40 mg at 07/05/20 3220    REVIEW OF SYSTEMS:   ASevere pain in the right leg al other systems were reviewed with the patient and are negative.  PHYSICAL EXAMINATION: ECOG PERFORMANCE STATUS: 4 - Bedbound  Vitals:   07/04/20 2112 07/05/20 0508  BP: 133/71 133/75  Pulse: 81 90  Resp: 20 17  Temp: 99 F (37.2 C) 99.1 F (37.3 C)  SpO2: 94% (!) 88%   Filed Weights   07/04/20 1346  Weight: 212 lb (96.2 kg)    Severe tenderness in the right thigh LABORATORY DATA:  I have reviewed the data as listed Lab Results  Component Value Date   WBC 9.5 07/05/2020   HGB 12.6 (L) 07/05/2020   HCT 38.6 (L) 07/05/2020   MCV 94.1 07/05/2020   PLT 222 07/05/2020   Lab Results  Component Value Date   NA 140  07/05/2020   K 3.7 07/05/2020   CL 103 07/05/2020   CO2 27 07/05/2020    RADIOGRAPHIC STUDIES: I have personally reviewed the radiological reports and agreed with the findings in the report.  ASSESSMENT AND PLAN:  Retroperitoneal hematoma: Extending into his thigh.  He is experiencing severe spasms because of the bleed.  He is unable to move his leg.  I prescribed him Flexeril 3 times daily as needed.  Once his spasms improve he may be able to move his legs better.  History of pulmonary embolus (PE) Lupus anticoagulant test has been ordered.  If it is negative then he does not need to remain on blood thinners.  Given the risks and benefits. If it is positive then we will have to assess when he can go back on blood thinners and we may have to consider Xarelto or Eliquis instead.  I spoke to his daughter Jinny Blossom explaining to her about our plan. We will evaluate tomorrow to see if his pain is better.  All questions were answered. The patient knows to call the clinic with any problems, questions or concerns.    Harriette Ohara, MD @T @

## 2020-07-05 NOTE — Evaluation (Addendum)
Physical Therapy Evaluation Patient Details Name: Paul Luna MRN: [id number] DOB: Oct 05, 1940 Today's Date: 07/05/2020   History of Present Illness  79 yo CM with PMH DVT/PE on chronic Coumadin (since 2010), UC, HTN, arthritis who presented with right thigh pain up to his groin.  He was seen at urgent care with similar complaints on 07/02/2020 and discharged home with pain prescription.  He returned for ongoing symptoms.  He underwent right lower extremity duplex on admission which was negative for DVT.He then underwent CT abdomen/pelvis which revealed right iliacus muscle noted with thickening and intramuscular hemorrhage as well as hematoma seen within the iliopsoas muscle and retroperitoneal hemorrhage tracking along the psoas and distributed throughout the right retroperitoneal spaces.  There was also trace intraperitoneal fluid/hemorrhage in the right paracolic gutter/deep pelvis.  Clinical Impression  Pt admitted with above diagnosis.  Pt able to transition to EOB, stand and take lateral steps only today. Limited by pain. Will continue to follow in acute setting. Encouraged pt to work on assisted hip flexion using gait belt later this pm as pain allows Recommend HHPT  And RW for home   Pt currently with functional limitations due to the deficits listed below (see PT Problem List). Pt will benefit from skilled PT to increase their independence and safety with mobility to allow discharge to the venue listed below.     Follow Up Recommendations Home health PT    Equipment Recommendations  RW    Recommendations for Other Services       Precautions / Restrictions Precautions Precautions: Fall Restrictions Weight Bearing Restrictions: No      Mobility  Bed Mobility Overal bed mobility: Needs Assistance Bed Mobility: Supine to Sit;Sit to Supine     Supine to sit: Min assist;+2 for physical assistance Sit to supine: Min assist;+2 for physical assistance   General bed mobility  comments: assist with RLE and to guide trunk into sitting/supine, incr time d/t pain  Transfers Overall transfer level: Needs assistance Equipment used: Rolling walker (2 wheeled) Transfers: Sit to/from Stand Sit to Stand: Min assist;+2 physical assistance;+2 safety/equipment;From elevated surface         General transfer comment: cues for hand placement and to power up with LLE. assist to come to stand and transition to RW  Ambulation/Gait             General Gait Details: lateral steps along EOB, cues for use of UEs/RW to unweight LLE for improved pain control  Stairs            Wheelchair Mobility    Modified Rankin (Stroke Patients Only)       Balance Overall balance assessment: Needs assistance Sitting-balance support: Feet supported;Single extremity supported;No upper extremity supported Sitting balance-Leahy Scale: Fair Sitting balance - Comments: unable to wt shift d/t pain   Standing balance support: Bilateral upper extremity supported Standing balance-Leahy Scale: Poor Standing balance comment: reliant on UEs                             Pertinent Vitals/Pain Pain Assessment: Faces Faces Pain Scale: Hurts whole lot Pain Location: right hip Pain Descriptors / Indicators: Grimacing;Spasm;Sore;Guarding Pain Intervention(s): Limited activity within patient's tolerance;Monitored during session;Repositioned;Premedicated before session    Home Living Family/patient expects to be discharged to:: Private residence Living Arrangements: Spouse/significant other Available Help at Discharge: Family Type of Home: House Home Access: Stairs to enter     Home Layout: One level Home Equipment:  None      Prior Function Level of Independence: Independent               Hand Dominance        Extremity/Trunk Assessment   Upper Extremity Assessment Upper Extremity Assessment: Overall WFL for tasks assessed    Lower Extremity  Assessment Lower Extremity Assessment: RLE deficits/detail RLE Deficits / Details: ankle and knee grossly WFL, hip limited by pain RLE: Unable to fully assess due to pain       Communication   Communication: No difficulties  Cognition Arousal/Alertness: Awake/alert Behavior During Therapy: WFL for tasks assessed/performed Overall Cognitive Status: Within Functional Limits for tasks assessed                                        General Comments      Exercises     Assessment/Plan    PT Assessment Patient needs continued PT services  PT Problem List Decreased strength;Decreased range of motion;Decreased activity tolerance;Decreased balance;Decreased knowledge of use of DME;Pain;Decreased mobility       PT Treatment Interventions DME instruction;Therapeutic exercise;Gait training;Functional mobility training;Therapeutic activities;Patient/family education    PT Goals (Current goals can be found in the Care Plan section)  Acute Rehab PT Goals Patient Stated Goal: have less pain and be able to walk PT Goal Formulation: With patient Time For Goal Achievement: 07/19/20 Potential to Achieve Goals: Good    Frequency Min 4X/week   Barriers to discharge        Co-evaluation               AM-PAC PT "6 Clicks" Mobility  Outcome Measure Help needed turning from your back to your side while in a flat bed without using bedrails?: A Lot Help needed moving from lying on your back to sitting on the side of a flat bed without using bedrails?: A Lot Help needed moving to and from a bed to a chair (including a wheelchair)?: A Lot Help needed standing up from a chair using your arms (e.g., wheelchair or bedside chair)?: A Lot Help needed to walk in hospital room?: Total Help needed climbing 3-5 steps with a railing? : Total 6 Click Score: 10    End of Session Equipment Utilized During Treatment: Gait belt Activity Tolerance: Patient limited by pain Patient  left: in bed;with call bell/phone within reach;with bed alarm set   PT Visit Diagnosis: Other abnormalities of gait and mobility (R26.89);Pain Pain - Right/Left: Right Pain - part of body: Hip;Leg    Time: 1443-1511 PT Time Calculation (min) (ACUTE ONLY): 28 min   Charges:   PT Evaluation $PT Eval Low Complexity: 1 Low PT Treatments $Therapeutic Activity: 8-22 mins        Baxter Flattery, PT  Acute Rehab Dept ([facility]) [phone] Pager [phone]  07/05/2020   [facility] 07/05/2020, 4:17 PM

## 2020-07-05 NOTE — Plan of Care (Signed)
  Problem: Education: Goal: Knowledge of General Education information will improve Description: Including pain rating scale, medication(s)/side effects and non-pharmacologic comfort measures Outcome: Progressing   Problem: Elimination: Goal: Will not experience complications related to bowel motility Outcome: Progressing   Problem: Activity: Goal: Risk for activity intolerance will decrease Outcome: Progressing

## 2020-07-05 NOTE — Progress Notes (Signed)
Subjective/Chief Complaint: Pt doing well  H/H stable Pt with some expected RLE pain with hip flexion   Objective: Vital signs in last 24 hours: Temp:  [99 F (37.2 C)-100.8 F (38.2 C)] 99.1 F (37.3 C) (10/03 0508) Pulse Rate:  [81-90] 90 (10/03 0508) Resp:  [14-20] 17 (10/03 0508) BP: (113-133)/(63-75) 133/75 (10/03 0508) SpO2:  [88 %-96 %] 88 % (10/03 0508) Weight:  [96.2 kg] 96.2 kg (10/02 1346) Last BM Date: 07/03/20  Intake/Output from previous day: 10/02 0701 - 10/03 0700 In: 840 [P.O.:840] Out: 1025 [Urine:1025] Intake/Output this shift: No intake/output data recorded.  PE:  Constitutional: No acute distress, conversant, appears states age. Eyes: Anicteric sclerae, moist conjunctiva, no lid lag Lungs: Clear to auscultation bilaterally, normal respiratory effort CV: regular rate and rhythm, no murmurs, no peripheral edema, pedal pulses 2+ GI: Soft, no masses or hepatosplenomegaly, non-tender to palpation Skin: No rashes, palpation reveals normal turgor Psychiatric: appropriate judgment and insight, oriented to person, place, and time   Lab Results:  Recent Labs    07/04/20 0956 07/05/20 0312  WBC 8.0 9.5  HGB 12.0* 12.6*  HCT 35.4* 38.6*  PLT 185 222   BMET Recent Labs    07/04/20 0956 07/05/20 0312  NA 137 140  K 3.6 3.7  CL 104 103  CO2 23 27  GLUCOSE 110* 122*  BUN 17 19  CREATININE 0.82 1.03  CALCIUM 8.1* 8.6*   PT/INR Recent Labs    07/04/20 1056 07/05/20 0312  LABPROT 15.4* 14.0  INR 1.3* 1.1   ABG No results for input(s): PHART, HCO3 in the last 72 hours.  Invalid input(s): PCO2, PO2  Studies/Results: CT Abdomen Pelvis W Contrast  Result Date: 07/03/2020 CLINICAL DATA:  Right lower quadrant abdominal pain EXAM: CT ABDOMEN AND PELVIS WITH CONTRAST TECHNIQUE: Multidetector CT imaging of the abdomen and pelvis was performed using the standard protocol following bolus administration of intravenous contrast. CONTRAST:  136mL  OMNIPAQUE IOHEXOL 300 MG/ML  SOLN COMPARISON:  Contemporary lumbar CT reconstruction, MR lumbar spine 04/03/2013 FINDINGS: Lower chest: Bandlike areas of opacity in the lung bases, likely atelectasis or scarring. No consolidation or effusion is seen. Normal heart size. No pericardial effusion. Hepatobiliary: No worrisome focal liver lesions. Smooth liver surface contour. Normal hepatic attenuation. Physiologic distension of the gallbladder. No calcified gallstones or pericholecystic inflammation. No biliary ductal dilatation. Pancreas: Partial fatty replacement of the pancreas. No pancreatic ductal dilatation or surrounding inflammatory changes. Spleen: Normal in size. No concerning splenic lesions. Adrenals/Urinary Tract: Normal adrenal glands. Kidneys are normally located with symmetric enhancement and excretion. No suspicious renal lesion, urolithiasis or hydronephrosis. Moderate distention of the urinary bladder without other gross abnormality. Stomach/Bowel: Small sliding-type hiatal hernia. Distal stomach is unremarkable. Intermediate attenuation fluid along the duodenal sweep may be redistributed hemorrhage within the retroperitoneum. No focal bowel wall and abnormality in this vicinity. No extraluminal air. No small bowel thickening or dilatation. No colonic dilatation or wall thickening. Trace free fluid is seen in the right paracolic gutter, possibly redistributed as well. A normal appendix is visualized. No colonic dilatation or wall thickening. Vascular/Lymphatic: There are multiple serpiginous blushes of hyperattenuation within the right iliacus musculature concerning for sites of active contrast extravasation. No other sites concerning for active contrast extravasation. No direct vascular injury is identified. Atherosclerotic calcifications within the abdominal aorta and branch vessels. No aneurysm or ectasia. No enlarged abdominopelvic lymph nodes. Reproductive: Prostate appears borderline enlarged  with heterogeneous calcifications though largely obscured by streak artifact from  bilateral hip prostheses. Trace right hydrocele is noted. No other acute abnormality of the external genitalia. Other: There is hemorrhage and fluid predominantly throughout the right retroperitoneum which likely arises from the sites of active extravasation in the right iliacus as detailed above. This fluid in hemorrhage does appear to redistributed into the posterior and anterior pararenal space as well as about the duodenum as denoted above. Musculoskeletal: There sites of contrast blush within the right iliacus muscle with additional heterogeneous hemorrhage along the psoas more proximally as well as what is likely a layering intramuscular hemorrhage of the more inferior iliopsoas muscle though partially obscured by adjacent streak artifact from the right hip prosthesis, series 2, image 87. 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 And acetabular component fixation screw in the right stands proud of the superior acetabular cortex (6/113) similarly, a fixation screw in the left closely approximates the cortex along the left ilium (6/100). No other acute or worrisome abnormalities of bony pelvis. Dedicated lumbar reconstructions are generated and dictated separately. Please see report for further details. In brief, there appears to be multilevel discogenic and facet degenerative changes as well as several small vertebral body hemangiomata but without acute osseous injury or suspicious osseous lesion. IMPRESSION: 1. Multiple serpiginous blushes of hyperattenuation within the right iliacus muscle concerning for sites of active contrast extravasation with extensive thickening and intramuscular hemorrhage. Layering intramuscular hematoma is seen within the iliopsoas muscle of the anterior proximal thigh as well as additional retroperitoneal hemorrhage tracking superiorly along the psoas and distributed throughout the  right retroperitoneal spaces. 2. Hyperdense hemorrhage is present in the retroperitoneum along the duodenal sweep though this is favored to be redistributed without acute abnormality of the bowel or extraluminal gas. 3. Trace intraperitoneal fluid/hemorrhage in the right paracolic gutter and deep pelvis, possibly redistributed as well. 4. No other acute abnormality in the abdomen or pelvis. Specifically, the appendix is normal. 5. 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 6. Dedicated lumbar reconstructions are generated and dictated separately. 7. Aortic Atherosclerosis (ICD10-I70.0). These results were called by telephone at the time of interpretation on 07/03/2020 at 10:56 pm to provider Dr Maryan Rued, who verbally acknowledged these results. Electronically Signed   By: Lovena Le M.D.   On: 07/03/2020 22:57   CT L-SPINE NO CHARGE  Result Date: 07/03/2020 CLINICAL DATA:  Right upper leg pain EXAM: CT Lumbar spine without contrast TECHNIQUE: Multiplanar CT images of the lumbar spine were reconstructed from contemporary CT of the Chest, Abdomen, and Pelvis COMPARISON:  Contemporary CT abdomen pelvis, MR lumbar spine 04/03/2013, radiographs 04/08/2013 FINDINGS: CT LUMBAR SPINE FINDINGS Segmentation: 5 normally formed lumbar type vertebral levels. Lowest fully formed disc space denoted as L5-S1. Partial sacralization of the L5 transverse processes is noted incidentally, benign variant. Alignment: Straightening of the normal lumbar lordosis. No significant spondylolisthesis or spondylolysis. No abnormally widened, perched or jumped facets. Vertebrae: No acute fracture or vertebral body height loss is evident. Benign vertebral body hemangiomata are present in the T12, L2 and L3 levels, seen on comparison MRI. No worrisome lytic or blastic lesions are seen. Some fairly prominent Schmorl's node formations are noted in the anterior inferior endplate L4, at  least several of which are new from comparison in 2014. 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 Some predominantly anterior endplate spurring is noted. Multilevel discogenic and facet degenerative changes are present, further detailed below. 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 Paraspinal  and other soft tissues: No perispinal fluid, swelling, gas or hemorrhage. No visible canal hematoma. Retroperitoneal hemorrhage centered within the right iliacus at a site of active contrast extravasation is better detailed on contemporary CT of the abdomen and pelvis, dictated separately. Aortoiliac atherosclerosis noted as well. For additional findings in the abdomen and pelvis, please see dedicated CT from which this study is reconstructed. Disc levels: Level by level evaluation of the lumbar spine below: T11-T12: Mild disc height loss without posterior disc abnormality. Mild bilateral facet arthropathy. No significant spinal canal or foraminal stenosis. T12-L1: Mild disc height loss without significant posterior disc abnormality. Mild bilateral facet arthropathy. No significant spinal canal or foraminal stenosis. L1-L2: Mild to moderate disc height loss with global disc bulge and mild bilateral facet arthropathy. At most mild canal stenosis and partial effacement of the lateral recesses. No significant foraminal narrowing. L2-L3: Moderate disc height loss with asymmetric global disc bulge eccentric to the left subarticular zone with moderate bilateral facet arthropathy. Mild canal stenosis and near complete effacement of the left lateral recess. No significant foraminal impingement. L3-L4: Moderate disc height loss with global disc bulge and a superimposed inferiorly directed disc extrusion extending 6 mm below the disc space. Mild canal stenosis and some asymmetric effacement of the right lateral recess. No significant foraminal impingement. L4-L5:  Moderate to severe disc height loss with disc desiccation and vacuum disc phenomenon as well as a global disc bulge asymmetric to the right subarticular zone with central disc protrusion and moderate to severe bilateral facet arthropathy resulting and some moderate canal stenosis, severe lateral recess narrowing, and moderate bilateral foraminal narrowing, left slightly greater than right. L5-S1: Severe disc height loss, disc desiccation and vacuum disc with Modic type endplate changes as well as a global disc bulge and severe bilateral facet arthropathy. Findings result in some mild canal stenosis and effacement of the lateral recesses as well as moderate to severe bilateral foraminal narrowing. IMPRESSION: 1. No acute fracture or vertebral body height loss identified. 2. Benign vertebral body hemangiomata T12, L2 and L3. 3. Multilevel degenerative changes of the lumbar spine as described level by level above. Features maximal L4-S1. Stenotic changes are likely accentuated by what appear to be congenitally shortened pedicles. 4. Interspinous arthrosis compatible with Baastrup's disease. 5. Retroperitoneal hemorrhage centered within the right iliacus in distributed throughout the retroperitoneum better detailed on dedicated CT from which this study is reconstructed. Electronically Signed   By: Lovena Le M.D.   On: 07/03/2020 23:11   VAS [place] LOWER EXTREMITY VENOUS (DVT) (ONLY [facility] & [facility])  Result Date: 07/04/2020  Lower Venous DVTStudy Indications: Pain in right thigh.  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 Comparison Study: 04-10-2013 LT lower extremity study available. Performing Technologist: Darlin Coco  Examination Guidelines: A complete evaluation includes B-mode imaging, spectral Doppler, color Doppler, and power Doppler as needed of all accessible portions of each vessel. Bilateral testing is considered an integral part of a complete examination. Limited examinations for reoccurring  indications may be performed as noted. The reflux portion of the exam is performed with the patient in reverse Trendelenburg.  +---------+---------------+---------+-----------+----------+------------------+ RIGHT    CompressibilityPhasicitySpontaneityPropertiesThrombus Aging     +---------+---------------+---------+-----------+----------+------------------+ CFV      Full           Yes      Yes                                     +---------+---------------+---------+-----------+----------+------------------+  SFJ      Full                                                            +---------+---------------+---------+-----------+----------+------------------+ FV Prox  Full                                                            +---------+---------------+---------+-----------+----------+------------------+ FV Mid   Full                                                            +---------+---------------+---------+-----------+----------+------------------+ FV DistalFull                                                            +---------+---------------+---------+-----------+----------+------------------+ PFV      Full                                                            +---------+---------------+---------+-----------+----------+------------------+ POP      Full           Yes      Yes                  Fibrin stranding                                                         noted              +---------+---------------+---------+-----------+----------+------------------+ PTV      Full                                                            +---------+---------------+---------+-----------+----------+------------------+ PERO     Full                                                            +---------+---------------+---------+-----------+----------+------------------+    +----+---------------+---------+-----------+----------+--------------+ LEFTCompressibilityPhasicitySpontaneityPropertiesThrombus Aging +----+---------------+---------+-----------+----------+--------------+ CFV Full           Yes      Yes                                 +----+---------------+---------+-----------+----------+--------------+  Summary: RIGHT: - There is no evidence of deep vein thrombosis in the lower extremity.  - No cystic structure found in the popliteal fossa.  LEFT: - No evidence of common femoral vein obstruction.  *See table(s) above for measurements and observations. Electronically signed by Harold Barban MD on 07/04/2020 at 1:09:53 PM.    Final      Assessment/Plan: 79 year old male with a retroperitoneal hematoma Supratherapeutic INR History of pulmonary embolus  Plan: Patient currently with stable H&H.  No further surgical plans as patient INR is within normal limits. Patient will need assistance with mobility.  I discussed with him that his hematoma likely resolve with time decreasing the amount of pain he has with hip flexion.  Please reconsult if needed.   LOS: 1 day    Ralene Ok 07/05/2020

## 2020-07-06 LAB — CBC WITH DIFFERENTIAL/PLATELET
Abs Immature Granulocytes: 0.03 10*3/uL (ref 0.00–0.07)
Basophils Absolute: 0 10*3/uL (ref 0.0–0.1)
Basophils Relative: 0 %
Eosinophils Absolute: 0.1 10*3/uL (ref 0.0–0.5)
Eosinophils Relative: 1 %
HCT: 35.6 % — ABNORMAL LOW (ref 39.0–52.0)
Hemoglobin: 11.8 g/dL — ABNORMAL LOW (ref 13.0–17.0)
Immature Granulocytes: 0 %
Lymphocytes Relative: 9 %
Lymphs Abs: 0.7 10*3/uL (ref 0.7–4.0)
MCH: 31.2 pg (ref 26.0–34.0)
MCHC: 33.1 g/dL (ref 30.0–36.0)
MCV: 94.2 fL (ref 80.0–100.0)
Monocytes Absolute: 0.6 10*3/uL (ref 0.1–1.0)
Monocytes Relative: 8 %
Neutro Abs: 6.2 10*3/uL (ref 1.7–7.7)
Neutrophils Relative %: 82 %
Platelets: 183 10*3/uL (ref 150–400)
RBC: 3.78 MIL/uL — ABNORMAL LOW (ref 4.22–5.81)
RDW: 13.9 % (ref 11.5–15.5)
WBC: 7.6 10*3/uL (ref 4.0–10.5)
nRBC: 0 % (ref 0.0–0.2)

## 2020-07-06 LAB — MAGNESIUM: Magnesium: 2 mg/dL (ref 1.7–2.4)

## 2020-07-06 LAB — BASIC METABOLIC PANEL
Anion gap: 10 (ref 5–15)
BUN: 19 mg/dL (ref 8–23)
CO2: 26 mmol/L (ref 22–32)
Calcium: 8.1 mg/dL — ABNORMAL LOW (ref 8.9–10.3)
Chloride: 105 mmol/L (ref 98–111)
Creatinine, Ser: 0.8 mg/dL (ref 0.61–1.24)
GFR calc Af Amer: >60 mL/min (ref >60)
GFR calc non Af Amer: >60 mL/min (ref >60)
Glucose, Bld: 131 mg/dL — ABNORMAL HIGH (ref 70–99)
Potassium: 3.7 mmol/L (ref 3.5–5.1)
Sodium: 141 mmol/L (ref 135–145)

## 2020-07-06 LAB — LUPUS ANTICOAGULANT PANEL
DRVVT: 45.5 s (ref 0.0–47.0)
PTT Lupus Anticoagulant: 39.8 s (ref 0.0–51.9)

## 2020-07-06 NOTE — Progress Notes (Addendum)
HEMATOLOGY-ONCOLOGY PROGRESS NOTE  SUBJECTIVE: Pain to his right leg is overall better.  He still has some intermittent sharp pains.  No bleeding reported today.  REVIEW OF SYSTEMS:   Constitutional: Denies fevers, chills  Eyes: Denies blurriness of vision Ears, nose, mouth, throat, and face: Denies mucositis or sore throat Respiratory: Denies cough, dyspnea or wheezes Cardiovascular: Denies palpitation, chest discomfort Gastrointestinal:  Denies nausea, heartburn or change in bowel habits Skin: Denies abnormal skin rashes Lymphatics: Denies new lymphadenopathy Neurological:Denies numbness, tingling or new weaknesses Behavioral/Psych: Mood is stable, no new changes  Extremities: Still with right leg pain and tightness which is overall improved All other systems were reviewed with the patient and are negative.  I have reviewed the past medical history, past surgical history, social history and family history with the patient and they are unchanged from previous note.   PHYSICAL EXAMINATION: ECOG PERFORMANCE STATUS: 4 - Bedbound  Vitals:   07/05/20 2148 07/06/20 0531  BP: 134/78 139/89  Pulse: 75 75  Resp: 17 17  Temp: 98.5 F (36.9 C) 98.1 F (36.7 C)  SpO2: 93% 93%   Filed Weights   07/04/20 1346  Weight: 96.2 kg    Intake/Output from previous day: 10/03 0701 - 10/04 0700 In: 120 [P.O.:120] Out: 1000 [Urine:1000]  GENERAL:alert, no distress and comfortable SKIN: No rashes, has a few scattered ecchymoses Musculoskeletal: Right leg with improved pain and tenderness NEURO: alert & oriented x 3 with fluent speech, no focal motor/sensory deficits  LABORATORY DATA:  I have reviewed the data as listed CMP Latest Ref Rng & Units 07/06/2020 07/05/2020 07/04/2020  Glucose 70 - 99 mg/dL 131(H) 122(H) 110(H)  BUN 8 - 23 mg/dL 19 19 17   Creatinine 0.61 - 1.24 mg/dL 0.80 1.03 0.82  Sodium 135 - 145 mmol/L 141 140 137  Potassium 3.5 - 5.1 mmol/L 3.7 3.7 3.6  Chloride 98 - 111  mmol/L 105 103 104  CO2 22 - 32 mmol/L 26 27 23   Calcium 8.9 - 10.3 mg/dL 8.1(L) 8.6(L) 8.1(L)  Total Protein 6.5 - 8.1 g/dL - - -  Total Bilirubin 0.3 - 1.2 mg/dL - - -  Alkaline Phos 38 - 126 U/L - - -  AST 15 - 41 U/L - - -  ALT 0 - 44 U/L - - -    Lab Results  Component Value Date   WBC 7.6 07/06/2020   HGB 11.8 (L) 07/06/2020   HCT 35.6 (L) 07/06/2020   MCV 94.2 07/06/2020   PLT 183 07/06/2020   NEUTROABS 6.2 07/06/2020    CT Abdomen Pelvis W Contrast  Result Date: 07/03/2020 CLINICAL DATA:  Right lower quadrant abdominal pain EXAM: CT ABDOMEN AND PELVIS WITH CONTRAST TECHNIQUE: Multidetector CT imaging of the abdomen and pelvis was performed using the standard protocol following bolus administration of intravenous contrast. CONTRAST:  153mL OMNIPAQUE IOHEXOL 300 MG/ML  SOLN COMPARISON:  Contemporary lumbar CT reconstruction, MR lumbar spine 04/03/2013 FINDINGS: Lower chest: Bandlike areas of opacity in the lung bases, likely atelectasis or scarring. No consolidation or effusion is seen. Normal heart size. No pericardial effusion. Hepatobiliary: No worrisome focal liver lesions. Smooth liver surface contour. Normal hepatic attenuation. Physiologic distension of the gallbladder. No calcified gallstones or pericholecystic inflammation. No biliary ductal dilatation. Pancreas: Partial fatty replacement of the pancreas. No pancreatic ductal dilatation or surrounding inflammatory changes. Spleen: Normal in size. No concerning splenic lesions. Adrenals/Urinary Tract: Normal adrenal glands. Kidneys are normally located with symmetric enhancement and excretion. No suspicious renal lesion,  urolithiasis or hydronephrosis. Moderate distention of the urinary bladder without other gross abnormality. Stomach/Bowel: Small sliding-type hiatal hernia. Distal stomach is unremarkable. Intermediate attenuation fluid along the duodenal sweep may be redistributed hemorrhage within the retroperitoneum. No focal  bowel wall and abnormality in this vicinity. No extraluminal air. No small bowel thickening or dilatation. No colonic dilatation or wall thickening. Trace free fluid is seen in the right paracolic gutter, possibly redistributed as well. A normal appendix is visualized. No colonic dilatation or wall thickening. Vascular/Lymphatic: There are multiple serpiginous blushes of hyperattenuation within the right iliacus musculature concerning for sites of active contrast extravasation. No other sites concerning for active contrast extravasation. No direct vascular injury is identified. Atherosclerotic calcifications within the abdominal aorta and branch vessels. No aneurysm or ectasia. No enlarged abdominopelvic lymph nodes. Reproductive: Prostate appears borderline enlarged with heterogeneous calcifications though largely obscured by streak artifact from bilateral hip prostheses. Trace right hydrocele is noted. No other acute abnormality of the external genitalia. Other: There is hemorrhage and fluid predominantly throughout the right retroperitoneum which likely arises from the sites of active extravasation in the right iliacus as detailed above. This fluid in hemorrhage does appear to redistributed into the posterior and anterior pararenal space as well as about the duodenum as denoted above. Musculoskeletal: There sites of contrast blush within the right iliacus muscle with additional heterogeneous hemorrhage along the psoas more proximally as well as what is likely a layering intramuscular hemorrhage of the more inferior iliopsoas muscle though partially obscured by adjacent streak artifact from the right hip prosthesis, series 2, image 87. 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 And acetabular component fixation screw in the right stands proud of the superior acetabular cortex (6/113) similarly, a fixation screw in the left closely approximates the cortex along the left ilium (6/100). No other acute  or worrisome abnormalities of bony pelvis. Dedicated lumbar reconstructions are generated and dictated separately. Please see report for further details. In brief, there appears to be multilevel discogenic and facet degenerative changes as well as several small vertebral body hemangiomata but without acute osseous injury or suspicious osseous lesion. IMPRESSION: 1. Multiple serpiginous blushes of hyperattenuation within the right iliacus muscle concerning for sites of active contrast extravasation with extensive thickening and intramuscular hemorrhage. Layering intramuscular hematoma is seen within the iliopsoas muscle of the anterior proximal thigh as well as additional retroperitoneal hemorrhage tracking superiorly along the psoas and distributed throughout the right retroperitoneal spaces. 2. Hyperdense hemorrhage is present in the retroperitoneum along the duodenal sweep though this is favored to be redistributed without acute abnormality of the bowel or extraluminal gas. 3. Trace intraperitoneal fluid/hemorrhage in the right paracolic gutter and deep pelvis, possibly redistributed as well. 4. No other acute abnormality in the abdomen or pelvis. Specifically, the appendix is normal. 5. 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 6. Dedicated lumbar reconstructions are generated and dictated separately. 7. Aortic Atherosclerosis (ICD10-I70.0). These results were called by telephone at the time of interpretation on 07/03/2020 at 10:56 pm to provider Dr Maryan Rued, who verbally acknowledged these results. Electronically Signed   By: Lovena Le M.D.   On: 07/03/2020 22:57   CT L-SPINE NO CHARGE  Result Date: 07/03/2020 CLINICAL DATA:  Right upper leg pain EXAM: CT Lumbar spine without contrast TECHNIQUE: Multiplanar CT images of the lumbar spine were reconstructed from contemporary CT of the Chest, Abdomen, and Pelvis COMPARISON:  Contemporary CT abdomen  pelvis, MR lumbar spine 04/03/2013,  radiographs 04/08/2013 FINDINGS: CT LUMBAR SPINE FINDINGS Segmentation: 5 normally formed lumbar type vertebral levels. Lowest fully formed disc space denoted as L5-S1. Partial sacralization of the L5 transverse processes is noted incidentally, benign variant. Alignment: Straightening of the normal lumbar lordosis. No significant spondylolisthesis or spondylolysis. No abnormally widened, perched or jumped facets. Vertebrae: No acute fracture or vertebral body height loss is evident. Benign vertebral body hemangiomata are present in the T12, L2 and L3 levels, seen on comparison MRI. No worrisome lytic or blastic lesions are seen. Some fairly prominent Schmorl's node formations are noted in the anterior inferior endplate L4, at least several of which are new from comparison in 2014. 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 Some predominantly anterior endplate spurring is noted. Multilevel discogenic and facet degenerative changes are present, further detailed below. 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 Paraspinal and other soft tissues: No perispinal fluid, swelling, gas or hemorrhage. No visible canal hematoma. Retroperitoneal hemorrhage centered within the right iliacus at a site of active contrast extravasation is better detailed on contemporary CT of the abdomen and pelvis, dictated separately. Aortoiliac atherosclerosis noted as well. For additional findings in the abdomen and pelvis, please see dedicated CT from which this study is reconstructed. Disc levels: Level by level evaluation of the lumbar spine below: T11-T12: Mild disc height loss without posterior disc abnormality. Mild bilateral facet arthropathy. No significant spinal canal or foraminal stenosis. T12-L1: Mild disc height loss without significant posterior disc abnormality. Mild bilateral facet arthropathy. No significant spinal canal or  foraminal stenosis. L1-L2: Mild to moderate disc height loss with global disc bulge and mild bilateral facet arthropathy. At most mild canal stenosis and partial effacement of the lateral recesses. No significant foraminal narrowing. L2-L3: Moderate disc height loss with asymmetric global disc bulge eccentric to the left subarticular zone with moderate bilateral facet arthropathy. Mild canal stenosis and near complete effacement of the left lateral recess. No significant foraminal impingement. L3-L4: Moderate disc height loss with global disc bulge and a superimposed inferiorly directed disc extrusion extending 6 mm below the disc space. Mild canal stenosis and some asymmetric effacement of the right lateral recess. No significant foraminal impingement. L4-L5: Moderate to severe disc height loss with disc desiccation and vacuum disc phenomenon as well as a global disc bulge asymmetric to the right subarticular zone with central disc protrusion and moderate to severe bilateral facet arthropathy resulting and some moderate canal stenosis, severe lateral recess narrowing, and moderate bilateral foraminal narrowing, left slightly greater than right. L5-S1: Severe disc height loss, disc desiccation and vacuum disc with Modic type endplate changes as well as a global disc bulge and severe bilateral facet arthropathy. Findings result in some mild canal stenosis and effacement of the lateral recesses as well as moderate to severe bilateral foraminal narrowing. IMPRESSION: 1. No acute fracture or vertebral body height loss identified. 2. Benign vertebral body hemangiomata T12, L2 and L3. 3. Multilevel degenerative changes of the lumbar spine as described level by level above. Features maximal L4-S1. Stenotic changes are likely accentuated by what appear to be congenitally shortened pedicles. 4. Interspinous arthrosis compatible with Baastrup's disease. 5. Retroperitoneal hemorrhage centered within the right iliacus in  distributed throughout the retroperitoneum better detailed on dedicated CT from which this study is reconstructed. Electronically Signed   By: Lovena Le M.D.   On: 07/03/2020 23:11   VAS [place] LOWER EXTREMITY VENOUS (DVT) (ONLY [facility] & [facility])  Result Date: 07/04/2020  Lower  Venous DVTStudy Indications: Pain in right thigh.  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 Comparison Study: 04-10-2013 LT lower extremity study available. Performing Technologist: Darlin Coco  Examination Guidelines: A complete evaluation includes B-mode imaging, spectral Doppler, color Doppler, and power Doppler as needed of all accessible portions of each vessel. Bilateral testing is considered an integral part of a complete examination. Limited examinations for reoccurring indications may be performed as noted. The reflux portion of the exam is performed with the patient in reverse Trendelenburg.  +---------+---------------+---------+-----------+----------+------------------+ RIGHT    CompressibilityPhasicitySpontaneityPropertiesThrombus Aging     +---------+---------------+---------+-----------+----------+------------------+ CFV      Full           Yes      Yes                                     +---------+---------------+---------+-----------+----------+------------------+ SFJ      Full                                                            +---------+---------------+---------+-----------+----------+------------------+ FV Prox  Full                                                            +---------+---------------+---------+-----------+----------+------------------+ FV Mid   Full                                                            +---------+---------------+---------+-----------+----------+------------------+ FV DistalFull                                                            +---------+---------------+---------+-----------+----------+------------------+ PFV      Full                                                             +---------+---------------+---------+-----------+----------+------------------+ POP      Full           Yes      Yes                  Fibrin stranding                                                         noted              +---------+---------------+---------+-----------+----------+------------------+  PTV      Full                                                            +---------+---------------+---------+-----------+----------+------------------+ PERO     Full                                                            +---------+---------------+---------+-----------+----------+------------------+   +----+---------------+---------+-----------+----------+--------------+ LEFTCompressibilityPhasicitySpontaneityPropertiesThrombus Aging +----+---------------+---------+-----------+----------+--------------+ CFV Full           Yes      Yes                                 +----+---------------+---------+-----------+----------+--------------+     Summary: RIGHT: - There is no evidence of deep vein thrombosis in the lower extremity.  - No cystic structure found in the popliteal fossa.  LEFT: - No evidence of common femoral vein obstruction.  *See table(s) above for measurements and observations. Electronically signed by Harold Barban MD on 07/04/2020 at 1:09:53 PM.    Final     ASSESSMENT AND PLAN: Retroperitoneal hematoma: Extending into his thigh.    He has been experiencing spasms secondary to bleed which are improved today after addition of Flexeril.  He still has limited movement of the right leg.  PT evaluation completed on 10/3.  Hemoglobin overall stable.  History of pulmonary embolus (PE) Lupus anticoagulant test has been ordered.  If it is negative then he does not need to remain on blood thinners.  Given the risks and benefits. If it is positive then we will have to assess when he can go back on  blood thinners and we may have to consider Xarelto or Eliquis instead.    LOS: 2 days   Mikey Bussing, DNP, AGPCNP-BC, AOCNP 07/06/20   Attending Note  I personally saw the patient, reviewed the chart and examined the patient. The plan of care was discussed with the patient . I agree with the assessment and plan as documented above.    Retroperitoneal hematoma with extending into his right thigh: Marked improvement in his pain and discomfort.  He is able to walk with the help of a walker and able to sit in the chair.  I anticipate that he will be discharged home tomorrow. It appears that the patient wants to remain on blood thinners even if the lupus anticoagulant test is negative. We could wait about a month before making the decision on his blood thinner.  He may go on Xarelto when he does begin anticoagulation back again.  He is extremely anxious about stopping anticoagulation. I will see him in my office in 1 month to discuss reinitiation of anticoagulation at that time.

## 2020-07-06 NOTE — TOC Initial Note (Signed)
Transition of Care [facility]) - Initial/Assessment Note    Patient Details  Name: Paul Luna MRN: [id number] Date of Birth: 1941-04-14  Transition of Care [facility]) CM/SW Contact:    Lia Hopping, [place] Phone Number: 07/06/2020, 12:01 PM  Clinical Narrative:                 CSW met with the patient at bedside to discuss home health PT/DME needs. Patient reports he lives at home with his spouse and is usually independent with ADL's. Patient was walking independently. Patient has requested a RW.  Patient has had HHPT in the past and is agreeable to services again. CSW provided patient with Medicare list of Home Health agencies and offered choice. Patient prefers [facility]. CSW confirm staff availability.  RW will be delivered to patient bedside by [facility].   Expected Discharge Plan: [place] Barriers to Discharge: Continued Medical Work up   Patient Goals and CMS Choice Patient states their goals for this hospitalization and ongoing recovery are:: to return home CMS Medicare.gov Compare Post Acute Care list provided to:: Patient Choice offered to / list presented to : Patient  Expected Discharge Plan and Services Expected Discharge Plan: [place] In-house Referral: Clinical Social Work   Post Acute Care Choice: [place] arrangements for the past 2 months: [place]                 DME Arranged: Walker rolling DME Agency: [facility] Date DME Agency Contacted: 07/06/20 Time DME Agency Contacted: [id number] Representative spoke with at DME Agency: Freda Munro HH Arranged: PT, OT [place] Agency: [place] ([place]) Date [place]: 07/06/20 Time [place]: 79 Representative spoke with at [place]: Santiago Glad  Prior Living Arrangements/Services Living arrangements for the past 2 months: Single Family Home Lives with:: Spouse Patient language and need for interpreter reviewed:: No Do you feel safe  going back to the place where you live?: Yes      Need for Family Participation in Patient Care: Yes (Comment) Care giver support system in place?: Yes (comment) Current home services: DME Criminal Activity/Legal Involvement Pertinent to Current Situation/Hospitalization: No - Comment as needed  Activities of Daily Living Home Assistive Devices/Equipment: None ADL Screening (condition at time of admission) Patient's cognitive ability adequate to safely complete daily activities?: Yes Is the patient deaf or have difficulty hearing?: No Does the patient have difficulty seeing, even when wearing glasses/contacts?: No Does the patient have difficulty concentrating, remembering, or making decisions?: No Patient able to express need for assistance with ADLs?: Yes Does the patient have difficulty dressing or bathing?: No Independently performs ADLs?: Yes (appropriate for developmental age) Does the patient have difficulty walking or climbing stairs?: No Weakness of Legs: Right (rt leg, acute) Weakness of Arms/Hands: None  Permission Sought/Granted Permission sought to share information with : Case Manager Permission granted to share information with : Yes, Verbal Permission Granted     Permission granted to share info w AGENCY: [place]        Emotional Assessment Appearance:: Appears stated age Attitude/Demeanor/Rapport: Engaged Affect (typically observed): Pleasant, Accepting Orientation: : Oriented to Self, Oriented to Place, Oriented to  Time, Oriented to Situation Alcohol / Substance Use: Not Applicable Psych Involvement: No (comment)  Admission diagnosis:  Back pain [M54.9] RLQ abdominal pain [R10.31] Right leg pain [M79.604] Right hip pain [M25.551] Psoas hematoma, right, secondary to anticoagulant therapy, initial encounter [S30.1XXA] Patient  Active Problem List   Diagnosis Date Noted  . Psoas hematoma, right, secondary to anticoagulant therapy,  initial encounter 07/04/2020  . Retroperitoneal hematoma 07/04/2020  . History of pulmonary embolus (PE) 07/04/2020  . Ulcerative colitis ([place]) 04/07/2020  . PAC (premature atrial contraction) 02/13/2014  . Hyperlipidemia LDL goal <100 12/20/2013  . DDD (degenerative disc disease), lumbar 06/19/2013  . Long term current use of anticoagulant therapy 04/13/2013  . History of bladder cancer 12/12/2012  . Gout 06/22/2012  . Pulmonary emboli ([place]) 11/03/2011  . DVT (deep venous thrombosis) ([place]) 11/03/2011  . Hypertension 11/03/2011  . Arthritis 11/03/2011  . Anxiety 11/03/2011   PCP:  Lilian Coma., MD Pharmacy:   [facility] [place], [place] - [place] [address] Phone: [phone] Fax: [phone]  [facility] [address] [place] [address] Phone: [phone] Fax: [place], [place] [address] [place] [address] Phone: [phone] Fax: [phone]     Social Determinants of Health (SDOH) Interventions    Readmission Risk Interventions No flowsheet data found.

## 2020-07-06 NOTE — Progress Notes (Signed)
Physical Therapy Treatment Patient Details Name: Paul Luna MRN: [id number] DOB: 1941-06-11 Today's Date: 07/06/2020    History of Present Illness 79 yo CM with PMH DVT/PE on chronic Coumadin (since 2010), UC, HTN, arthritis who presented with right thigh pain up to his groin.  He was seen at urgent care with similar complaints on 07/02/2020 and discharged home with pain prescription.  He returned for ongoing symptoms.  He underwent right lower extremity duplex on admission which was negative for DVT.He then underwent CT abdomen/pelvis which revealed right iliacus muscle noted with thickening and intramuscular hemorrhage as well as hematoma seen within the iliopsoas muscle and retroperitoneal hemorrhage tracking along the psoas and distributed throughout the right retroperitoneal spaces.  There was also trace intraperitoneal fluid/hemorrhage in the right paracolic gutter/deep pelvis.    PT Comments    Pt assisted with ambulating in hallway.  Pt requiring assist for transitional movements due to pain however able to tolerate ambulating today.  Pt still with 6/10 right leg pain with mobility.  Pt anticipates d/c home tomorrow.    Follow Up Recommendations  Home health PT     Equipment Recommendations  Rolling walker with 5" wheels    Recommendations for Other Services       Precautions / Restrictions Precautions Precautions: Fall    Mobility  Bed Mobility Overal bed mobility: Needs Assistance Bed Mobility: Supine to Sit;Sit to Supine     Supine to sit: Min assist Sit to supine: Min assist   General bed mobility comments: assist with RLE, incr time d/t pain  Transfers Overall transfer level: Needs assistance Equipment used: Rolling walker (2 wheeled) Transfers: Sit to/from Stand Sit to Stand: Min assist;From elevated surface         General transfer comment: cues for UE and LE positioning to assist with pain control  Ambulation/Gait Ambulation/Gait assistance: Min  guard   Assistive device: Rolling walker (2 wheeled) Gait Pattern/deviations: Step-through pattern;Decreased stride length;Decreased stance time - right     General Gait Details: verbal cues for initial sequence, RW positioning, followed with recliner however not needed   Stairs             Wheelchair Mobility    Modified Rankin (Stroke Patients Only)       Balance                                            Cognition Arousal/Alertness: Awake/alert Behavior During Therapy: WFL for tasks assessed/performed Overall Cognitive Status: Within Functional Limits for tasks assessed                                        Exercises      General Comments        Pertinent Vitals/Pain Pain Assessment: 0-10 Pain Score: 6  Pain Location: right hip Pain Descriptors / Indicators: Grimacing;Spasm;Sore;Guarding Pain Intervention(s): Monitored during session;Repositioned    Home Living                      Prior Function            PT Goals (current goals can now be found in the care plan section) Progress towards PT goals: Progressing toward goals    Frequency    Min 4X/week  PT Plan Current plan remains appropriate    Co-evaluation              AM-PAC PT "6 Clicks" Mobility   Outcome Measure  Help needed turning from your back to your side while in a flat bed without using bedrails?: A Little Help needed moving from lying on your back to sitting on the side of a flat bed without using bedrails?: A Little Help needed moving to and from a bed to a chair (including a wheelchair)?: A Little Help needed standing up from a chair using your arms (e.g., wheelchair or bedside chair)?: A Lot Help needed to walk in hospital room?: A Little Help needed climbing 3-5 steps with a railing? : A Lot 6 Click Score: 16    End of Session Equipment Utilized During Treatment: Gait belt Activity Tolerance: Patient tolerated  treatment well Patient left: in bed;with call bell/phone within reach;with bed alarm set   PT Visit Diagnosis: Other abnormalities of gait and mobility (R26.89);Pain Pain - Right/Left: Right Pain - part of body: Hip;Leg     Time: 7530-0511 PT Time Calculation (min) (ACUTE ONLY): 27 min  Charges:  $Gait Training: 23-37 mins                     Jannette Spanner PT, DPT Acute Rehabilitation Services Pager: [phone] Office: [phone]  York Ram E 07/06/2020, 1:23 PM

## 2020-07-06 NOTE — Evaluation (Signed)
Occupational Therapy Evaluation Patient Details Name: Paul Luna MRN: [id number] DOB: [birth year] Today's Date: 07/06/2020    History of Present Illness 79 yo CM with PMH DVT/PE on chronic Coumadin (since 2010), UC, HTN, arthritis who presented with right thigh pain up to his groin.  He was seen at urgent care with similar complaints on 07/02/2020 and discharged home with pain prescription.  He returned for ongoing symptoms.  He underwent right lower extremity duplex on admission which was negative for DVT.He then underwent CT abdomen/pelvis which revealed right iliacus muscle noted with thickening and intramuscular hemorrhage as well as hematoma seen within the iliopsoas muscle and retroperitoneal hemorrhage tracking along the psoas and distributed throughout the right retroperitoneal spaces.  There was also trace intraperitoneal fluid/hemorrhage in the right paracolic gutter/deep pelvis.   Clinical Impression   Pt admitted with the above. Pt currently with functional limitations due to the deficits listed below (see OT Problem List).  Pt will benefit from skilled OT to increase their safety and independence with ADL and functional mobility for ADL to facilitate discharge to venue listed below.      Follow Up Recommendations  Home health OT;Supervision/Assistance - 24 hour    Equipment Recommendations  3 in 1 bedside commode;Tub/shower bench       Precautions / Restrictions Precautions Precautions: Fall      Mobility Bed Mobility Overal bed mobility: Needs Assistance Bed Mobility: Supine to Sit     Supine to sit: Min assist Sit to supine: Min assist   General bed mobility comments: assist with RLE, incr time d/t pain  Transfers Overall transfer level: Needs assistance Equipment used: Rolling walker (2 wheeled) Transfers: Sit to/from [facility] Sit to Stand: Min assist;From elevated surface Stand pivot transfers: Min assist       General transfer comment:  cues for UE and LE positioning to assist with pain control    Balance Overall balance assessment: Needs assistance Sitting-balance support: Feet supported;Single extremity supported;No upper extremity supported Sitting balance-Leahy Scale: Fair Sitting balance - Comments: unable to wt shift d/t pain   Standing balance support: Bilateral upper extremity supported Standing balance-Leahy Scale: Poor Standing balance comment: reliant on UEs                           ADL either performed or assessed with clinical judgement   ADL Overall ADL's : Needs assistance/impaired Eating/Feeding: Set up;Sitting   Grooming: Set up;Sitting   Upper Body Bathing: Set up;Sitting   Lower Body Bathing: Maximal assistance;Sit to/from stand;Cueing for compensatory techniques;Cueing for sequencing   Upper Body Dressing : Sitting;Minimal assistance   Lower Body Dressing: Maximal assistance;Sit to/from stand;Cueing for safety;Cueing for sequencing   Toilet Transfer: Minimal assistance;Ambulation;RW;Cueing for sequencing;Cueing for safety   Toileting- Clothing Manipulation and Hygiene: Minimal assistance;Sit to/from stand;Cueing for safety;Cueing for sequencing       Functional mobility during ADLs: Minimal assistance;Rolling walker       Vision Patient Visual Report: No change from baseline              Pertinent Vitals/Pain Pain Score: 3  Pain Location: right hip Pain Descriptors / Indicators: Grimacing;Spasm;Sore;Guarding Pain Intervention(s): Limited activity within patient's tolerance           Communication Communication Communication: No difficulties   Cognition Arousal/Alertness: Awake/alert Behavior During Therapy: WFL for tasks assessed/performed Overall Cognitive Status: Within Functional Limits for tasks assessed  Home Living Family/patient expects to be discharged to:: Private residence Living  Arrangements: Spouse/significant other Available Help at Discharge: Family Type of Home: House Home Access: Stairs to enter     Home Layout: One level         Bathroom Toilet: [place]: None          Prior Functioning/Environment Level of Independence: Independent                 OT Problem List: Decreased strength;Impaired balance (sitting and/or standing);Decreased knowledge of use of DME or AE      OT Treatment/Interventions: Self-care/ADL training;Patient/family education;DME and/or AE instruction    OT Goals(Current goals can be found in the care plan section) Acute Rehab OT Goals Patient Stated Goal: have less pain and be able to walk OT Goal Formulation: With patient Time For Goal Achievement: 07/13/20  OT Frequency: Min 2X/week    AM-PAC OT "6 Clicks" Daily Activity     Outcome Measure   Help from another person taking care of personal grooming?: A Little Help from another person toileting, which includes using toliet, bedpan, or urinal?: A Little Help from another person bathing (including washing, rinsing, drying)?: A Lot Help from another person to put on and taking off regular upper body clothing?: A Little Help from another person to put on and taking off regular lower body clothing?: A Lot 6 Click Score: 13   End of Session Equipment Utilized During Treatment: Rolling walker Nurse Communication: Mobility status  Activity Tolerance: Patient tolerated treatment well Patient left: in chair  OT Visit Diagnosis: Unsteadiness on feet (R26.81);Other abnormalities of gait and mobility (R26.89);Muscle weakness (generalized) (M62.81);History of falling (Z91.81)                Time: 1730-1809 OT Time Calculation (min): 39 min Charges:  OT General Charges $OT Visit: 1 Visit OT Evaluation $OT Eval Moderate Complexity: 1 Mod OT Treatments $Self Care/Home Management : 8-22 mins  Kari Baars, OT Acute Rehabilitation  Services [phone] Office- [phone], Edwena Felty D 07/06/2020, 7:02 PM

## 2020-07-06 NOTE — Progress Notes (Signed)
PROGRESS NOTE    Paul Luna   ZOX:[id number]  DOB: [birth year]  DOA: 07/03/2020     2  PCP: Lilian Coma., MD  CC: right thigh pain   Hospital Course: [name] is a 79 yo CM with PMH DVT/PE on chronic Coumadin (since 2010), UC, HTN, arthritis who presented with right thigh pain up to his groin.  He was seen at urgent care with similar complaints on 07/02/2020 and discharged home with pain prescription.  He returned for ongoing symptoms.  He underwent right lower extremity duplex on admission which was negative for DVT. He then underwent CT abdomen/pelvis which revealed right iliacus muscle noted with thickening and intramuscular hemorrhage as well as hematoma seen within the iliopsoas muscle and retroperitoneal hemorrhage tracking along the psoas and distributed throughout the right retroperitoneal spaces.  There was also trace intraperitoneal fluid/hemorrhage in the right paracolic gutter/deep pelvis.  His INR on admission was 5 and he underwent reversal with [facility] and vitamin K 10 mg x 1.  Repeat INR was 1.6 and remained reversed with trending.  He was hemodynamically stable.  Hemoglobin was also trended on admission.  Due to his CT findings, surgery was also consulted for further recommendations. Hematology was consulted regarding further recommendations regarding resuming AC vs alternative drug choices if needed as well. His INR remained stable and his clinical status did not worsen. PT was consulted for evaluating patient's mobility status given his RLE pain interfering with his ability to ambulate well.    Interval History:  Patient admitted with ongoing right lower extremity pain at home.   No acute events overnight. Each day pain is improving some.  Working well with PT. Still has RLE pain but tolerable more.   Old records reviewed in assessment of this patient  ROS: Constitutional: negative for chills and fevers, Respiratory: negative for cough, Cardiovascular: negative for  chest pain and Gastrointestinal: negative for abdominal pain  Assessment & Plan: * Retroperitoneal hematoma - CT abdomen/pelvis revealed right iliacus muscle noted with thickening and intramuscular hemorrhage as well as hematoma seen within the iliopsoas muscle and retroperitoneal hemorrhage tracking along the psoas and distributed throughout the right retroperitoneal spaces.  There was also trace intraperitoneal fluid/hemorrhage in the right paracolic gutter/deep pelvis. - surgery consulted, appreciate assistance; patient has no surgical needs and remains stable. Surgery signing off due to stability  - Hgb stable.   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 INR has been reversed with [place] and Vit K on admission   Psoas hematoma, right, secondary to anticoagulant therapy, initial encounter - Patient has pain extending down into right thigh. Currently no s/s compartment syndrome - continue to monitor for any developing signs of compartment syndrome in RLE - PT eval as pain is limiting mobility; will be okay for HHPT with eval - flexeril added per hematology; will follow up response to this; seems to be working   Antimicrobials: none  DVT prophylaxis: SCD Code Status: full Family Communication: none present Disposition Plan: Status is: Inpatient  Remains inpatient appropriate because:Ongoing active pain requiring inpatient pain management, Unsafe d/c plan and Inpatient  level of care appropriate due to severity of illness   Dispo: The patient is from: Home               Anticipated d/c is to: pending PT eval              Anticipated d/c date is: 2 days              Patient currently is not medically stable to d/c.  Objective: Blood pressure 139/89, pulse 75, temperature 98.1 F (36.7 C), temperature source Oral, resp. rate 17, height 6\' 1"  (1.854 m), weight 96.2 kg, SpO2 93 %.  Examination: General appearance: alert, cooperative and no distress Head: Normocephalic, without obvious abnormality, atraumatic Eyes: EOMI Lungs: clear to auscultation bilaterally Heart: regular rate and rhythm and S1, S2 normal Abdomen: normal findings: bowel sounds normal and soft, non-tender Extremities: RLE is TTP; compartment is soft with no signs of compartment. Skin: mobility and turgor normal Neurologic: paresthesia better in RLE, no true weakness and no obvious focal deficits  Consultants:   Surgery  Hematology  Procedures:   none  Data Reviewed: I have personally reviewed following labs and imaging studies Results for orders placed or performed during the hospital encounter of 07/03/20 (from the past 24 hour(s))  Basic metabolic panel     Status: Abnormal   Collection Time: 07/06/20  4:19 AM  Result Value Ref Range   Sodium 141 135 - 145 mmol/L   Potassium 3.7 3.5 - 5.1 mmol/L   Chloride 105 98 - 111 mmol/L   CO2 26 22 - 32 mmol/L   Glucose, Bld 131 (H) 70 - 99 mg/dL   BUN 19 8 - 23 mg/dL   Creatinine, Ser 0.80 0.61 - 1.24 mg/dL   Calcium 8.1 (L) 8.9 - 10.3 mg/dL   GFR calc non Af Amer >60 >60 mL/min   GFR calc Af Amer >60 >60 mL/min   Anion gap 10 5 - 15  CBC with Differential/Platelet     Status: Abnormal   Collection Time: 07/06/20  4:19 AM  Result Value Ref Range   WBC 7.6 4.0 - 10.5 K/uL   RBC 3.78 (L) 4.22 - 5.81 MIL/uL   Hemoglobin 11.8 (L) 13.0 - 17.0 g/dL   HCT 35.6 (L) 39 - 52 %   MCV 94.2 80.0 - 100.0 fL   MCH 31.2 26.0 - 34.0 pg   MCHC 33.1 30.0 - 36.0 g/dL   RDW 13.9 11.5 - 15.5 %   Platelets 183 150 - 400 K/uL   nRBC 0.0 0.0 -  0.2 %   Neutrophils Relative % 82 %   Neutro Abs 6.2 1.7 - 7.7 K/uL   Lymphocytes Relative 9 %   Lymphs Abs 0.7 0.7 - 4.0 K/uL   Monocytes Relative 8 %   Monocytes Absolute 0.6 0 - 1 K/uL   Eosinophils Relative 1 %   Eosinophils Absolute 0.1 0 - 0 K/uL   Basophils Relative 0 %   Basophils Absolute 0.0 0 - 0 K/uL   Immature Granulocytes 0 %   Abs Immature Granulocytes 0.03 0.00 - 0.07 K/uL  Magnesium     Status: None   Collection Time: 07/06/20  4:19 AM  Result Value Ref Range   Magnesium 2.0 1.7 - 2.4 mg/dL    Recent Results (from the past 240 hour(s))  Respiratory Panel by RT PCR (Flu A&B, Covid) - Nasopharyngeal Swab     Status: None   Collection Time: 07/04/20 12:09 AM  Specimen: Nasopharyngeal Swab  Result Value Ref Range Status   SARS Coronavirus 2 by RT PCR NEGATIVE NEGATIVE Final    Comment: (NOTE) SARS-CoV-2 target nucleic acids are NOT DETECTED.  The SARS-CoV-2 RNA is generally detectable in upper respiratoy specimens during the acute phase of infection. The lowest concentration of SARS-CoV-2 viral copies this assay can detect is 131 copies/mL. A negative result does not preclude SARS-Cov-2 infection and should not be used as the sole basis for treatment or other patient management decisions. A negative result may occur with  improper specimen collection/handling, submission of specimen other than nasopharyngeal swab, presence of viral mutation(s) within the areas targeted by this assay, and inadequate number of viral copies (<131 copies/mL). A negative result must be combined with clinical observations, patient history, and epidemiological information. The expected result is Negative.  Fact Sheet for Patients:  PinkCheek.be  Fact Sheet for Healthcare Providers:  GravelBags.it  This test is no t yet approved or cleared by the [place] FDA and  has been authorized for detection and/or diagnosis of  SARS-CoV-2 by FDA under an Emergency Use Authorization (EUA). This EUA will remain  in effect (meaning this test can be used) for the duration of the COVID-19 declaration under Section 564(b)(1) of the Act, 21 U.S.C. section 360bbb-3(b)(1), unless the authorization is terminated or revoked sooner.     Influenza A by PCR NEGATIVE NEGATIVE Final   Influenza B by PCR NEGATIVE NEGATIVE Final    Comment: (NOTE) The Xpert Xpress SARS-CoV-2/FLU/RSV assay is intended as an aid in  the diagnosis of influenza from Nasopharyngeal swab specimens and  should not be used as a sole basis for treatment. Nasal washings and  aspirates are unacceptable for Xpert Xpress SARS-CoV-2/FLU/RSV  testing.  Fact Sheet for Patients: PinkCheek.be  Fact Sheet for Healthcare Providers: GravelBags.it  This test is not yet approved or cleared by the [place] FDA and  has been authorized for detection and/or diagnosis of SARS-CoV-2 by  FDA under an Emergency Use Authorization (EUA). This EUA will remain  in effect (meaning this test can be used) for the duration of the  Covid-19 declaration under Section 564(b)(1) of the Act, 21  U.S.C. section 360bbb-3(b)(1), unless the authorization is  terminated or revoked. Performed at [facility], [place] [address] [place], [place] [address]      Radiology Studies: No results found. CT Abdomen Pelvis W Contrast  Final Result    CT L-SPINE NO CHARGE  Final Result    VAS [place] LOWER EXTREMITY VENOUS (DVT) (ONLY [facility] & [facility])  Final Result      Scheduled Meds:  budesonide  9 mg Oral BID   ezetimibe  10 mg Oral Daily   sertraline  200 mg Oral Daily   simvastatin  40 mg Oral Daily   PRN Meds: acetaminophen **OR** acetaminophen, cyclobenzaprine, HYDROmorphone (DILAUDID) injection, ondansetron **OR** ondansetron (ZOFRAN) IV Continuous Infusions:    LOS: 2 days  Time spent: Greater than  50% of the 35 minute visit was spent in counseling/coordination of care for the patient as laid out in the A&P.   Dwyane Dee, MD [facility] 07/06/2020, 1:42 PM  Contact via secure chat.  To contact the attending provider between 7A-7P or the covering provider during after hours 7P-7A, please log into the web site [email or web address] and access using universal [place] password for that web site. If you do not have the password, please call the hospital operator.

## 2020-07-07 LAB — CBC WITH DIFFERENTIAL/PLATELET
Abs Immature Granulocytes: 0.03 10*3/uL (ref 0.00–0.07)
Basophils Absolute: 0 10*3/uL (ref 0.0–0.1)
Basophils Relative: 0 %
Eosinophils Absolute: 0.2 10*3/uL (ref 0.0–0.5)
Eosinophils Relative: 3 %
HCT: 35.3 % — ABNORMAL LOW (ref 39.0–52.0)
Hemoglobin: 11.7 g/dL — ABNORMAL LOW (ref 13.0–17.0)
Immature Granulocytes: 0 %
Lymphocytes Relative: 7 %
Lymphs Abs: 0.6 10*3/uL — ABNORMAL LOW (ref 0.7–4.0)
MCH: 31.1 pg (ref 26.0–34.0)
MCHC: 33.1 g/dL (ref 30.0–36.0)
MCV: 93.9 fL (ref 80.0–100.0)
Monocytes Absolute: 0.8 10*3/uL (ref 0.1–1.0)
Monocytes Relative: 9 %
Neutro Abs: 6.6 10*3/uL (ref 1.7–7.7)
Neutrophils Relative %: 81 %
Platelets: 209 10*3/uL (ref 150–400)
RBC: 3.76 MIL/uL — ABNORMAL LOW (ref 4.22–5.81)
RDW: 14.2 % (ref 11.5–15.5)
WBC: 8.3 10*3/uL (ref 4.0–10.5)
nRBC: 0 % (ref 0.0–0.2)

## 2020-07-07 LAB — MAGNESIUM: Magnesium: 1.9 mg/dL (ref 1.7–2.4)

## 2020-07-07 LAB — BASIC METABOLIC PANEL
Anion gap: 11 (ref 5–15)
BUN: 28 mg/dL — ABNORMAL HIGH (ref 8–23)
CO2: 26 mmol/L (ref 22–32)
Calcium: 8.4 mg/dL — ABNORMAL LOW (ref 8.9–10.3)
Chloride: 102 mmol/L (ref 98–111)
Creatinine, Ser: 0.96 mg/dL (ref 0.61–1.24)
GFR calc Af Amer: >60 mL/min (ref >60)
GFR calc non Af Amer: >60 mL/min (ref >60)
Glucose, Bld: 130 mg/dL — ABNORMAL HIGH (ref 70–99)
Potassium: 4.1 mmol/L (ref 3.5–5.1)
Sodium: 139 mmol/L (ref 135–145)

## 2020-07-07 MED ORDER — ACETAMINOPHEN 500 MG PO TABS: 1000.0000 mg | ORAL_TABLET | Freq: Four times a day (QID) | ORAL | 2 refills | Status: AC | PRN

## 2020-07-07 MED ORDER — OXYCODONE HCL 5 MG PO TABS: 5.0000 mg | ORAL_TABLET | ORAL | 0 refills | Status: DC | PRN

## 2020-07-07 NOTE — Progress Notes (Signed)
Occupational Therapy Treatment Patient Details Name: Paul Luna MRN: [id number] DOB: 1941/03/02 Today's Date: 07/07/2020    History of present illness 79 yo CM with PMH DVT/PE on chronic Coumadin (since 2010), UC, HTN, arthritis who presented with right thigh pain up to his groin.  He was seen at urgent care with similar complaints on 07/02/2020 and discharged home with pain prescription.  He returned for ongoing symptoms.  He underwent right lower extremity duplex on admission which was negative for DVT.He then underwent CT abdomen/pelvis which revealed right iliacus muscle noted with thickening and intramuscular hemorrhage as well as hematoma seen within the iliopsoas muscle and retroperitoneal hemorrhage tracking along the psoas and distributed throughout the right retroperitoneal spaces.  There was also trace intraperitoneal fluid/hemorrhage in the right paracolic gutter/deep pelvis.   OT comments  Pt will obtain Tub bench.    Follow Up Recommendations  Home health OT;Supervision/Assistance - 24 hour    Equipment Recommendations  3 in 1 bedside commode;Tub/shower bench    Recommendations for Other Services      Precautions / Restrictions Precautions Precautions: Fall       Mobility Bed Mobility Overal bed mobility: Modified Independent             General bed mobility comments: assist with RLE, incr time d/t pain  Transfers Overall transfer level: Needs assistance Equipment used: Rolling walker (2 wheeled) Transfers: Sit to/from [facility] Sit to Stand: Min assist;From elevated surface;Supervision Stand pivot transfers: Supervision            Balance Overall balance assessment: Needs assistance Sitting-balance support: Feet supported;Single extremity supported;No upper extremity supported Sitting balance-Leahy Scale: Good Sitting balance - Comments: unable to wt shift d/t pain   Standing balance support: Bilateral upper extremity  supported Standing balance-Leahy Scale: Fair Standing balance comment: reliant on UEs                           ADL either performed or assessed with clinical judgement   ADL Overall ADL's : Needs assistance/impaired     Grooming: Standing;Supervision/safety       Lower Body Bathing: Minimal assistance;Sit to/from stand;With adaptive equipment       Lower Body Dressing: With adaptive equipment;Minimal assistance;Sit to/from stand   Toilet Transfer: Ambulation;RW;Cueing for sequencing;Cueing for safety;Supervision/safety   Toileting- Clothing Manipulation and Hygiene: Sit to/from stand;Cueing for safety;Cueing for sequencing;Supervision/safety       Functional mobility during ADLs: Supervision/safety;Cueing for safety;Cueing for sequencing;Rolling walker       Vision Patient Visual Report: No change from baseline            Cognition Arousal/Alertness: Awake/alert Behavior During Therapy: WFL for tasks assessed/performed Overall Cognitive Status: Within Functional Limits for tasks assessed                                 General Comments: AxO x 3 feeling "better" with less pain                   Pertinent Vitals/ Pain       Pain Assessment: Faces (Simultaneous filing. User may not have seen previous data.) Faces Pain Scale: Hurts a little bit Pain Location: right hip with activity Pain Descriptors / Indicators: Aching;Tender Pain Intervention(s): Monitored during session;Repositioned;Ice applied         Frequency  Min 2X/week  Progress Toward Goals  OT Goals(current goals can now be found in the care plan section)  Progress towards OT goals: Progressing toward goals     Plan Discharge plan remains appropriate       AM-PAC OT "6 Clicks" Daily Activity     Outcome Measure   Help from another person eating meals?: None Help from another person taking care of personal grooming?: A Little Help from another  person toileting, which includes using toliet, bedpan, or urinal?: A Little Help from another person bathing (including washing, rinsing, drying)?: A Lot Help from another person to put on and taking off regular upper body clothing?: A Little Help from another person to put on and taking off regular lower body clothing?: A Lot 6 Click Score: 17    End of Session Equipment Utilized During Treatment: Rolling walker  OT Visit Diagnosis: Unsteadiness on feet (R26.81);Other abnormalities of gait and mobility (R26.89);Muscle weakness (generalized) (M62.81);History of falling (Z91.81)   Activity Tolerance Patient tolerated treatment well   Patient Left in chair   Nurse Communication Mobility status        Time: 2993-7169 OT Time Calculation (min): 23 min  Charges: OT General Charges $OT Visit: 1 Visit OT Treatments $Self Care/Home Management : 23-37 mins  Kari Baars, [place] [phone] Office- [place], Edwena Felty D 07/07/2020, 12:59 PM

## 2020-07-07 NOTE — Plan of Care (Signed)
  Problem: Education: Goal: Knowledge of General Education information will improve Description: Including pain rating scale, medication(s)/side effects and non-pharmacologic comfort measures 07/07/2020 1142 by Hubert Azure, RN Outcome: Adequate for Discharge 07/07/2020 0943 by Hubert Azure, RN Outcome: Progressing   Problem: Health Behavior/Discharge Planning: Goal: Ability to manage health-related needs will improve 07/07/2020 1142 by Hubert Azure, RN Outcome: Adequate for Discharge 07/07/2020 0943 by Hubert Azure, RN Outcome: Progressing   Problem: Clinical Measurements: Goal: Ability to maintain clinical measurements within normal limits will improve 07/07/2020 1142 by Hubert Azure, RN Outcome: Adequate for Discharge 07/07/2020 0943 by Hubert Azure, RN Outcome: Progressing Goal: Will remain free from infection 07/07/2020 1142 by Hubert Azure, RN Outcome: Adequate for Discharge 07/07/2020 0943 by Hubert Azure, RN Outcome: Progressing Goal: Diagnostic test results will improve Outcome: Adequate for Discharge Goal: Respiratory complications will improve Outcome: Adequate for Discharge Goal: Cardiovascular complication will be avoided Outcome: Adequate for Discharge   Problem: Activity: Goal: Risk for activity intolerance will decrease Outcome: Adequate for Discharge   Problem: Nutrition: Goal: Adequate nutrition will be maintained Outcome: Adequate for Discharge   Problem: Coping: Goal: Level of anxiety will decrease Outcome: Adequate for Discharge   Problem: Elimination: Goal: Will not experience complications related to bowel motility Outcome: Adequate for Discharge Goal: Will not experience complications related to urinary retention Outcome: Adequate for Discharge   Problem: Pain Managment: Goal: General experience of comfort will improve Outcome: Adequate for Discharge   Problem: Safety: Goal: Ability to remain free from injury will  improve Outcome: Adequate for Discharge   Problem: Skin Integrity: Goal: Risk for impaired skin integrity will decrease Outcome: Adequate for Discharge

## 2020-07-07 NOTE — Plan of Care (Signed)

## 2020-07-07 NOTE — Progress Notes (Signed)
Physical Therapy Treatment Patient Details Name: Paul Luna MRN: [id number] DOB: September 29, 1941 Today's Date: 07/07/2020    History of Present Illness 79 yo CM with PMH DVT/PE on chronic Coumadin (since 2010), UC, HTN, arthritis who presented with right thigh pain up to his groin.  He was seen at urgent care with similar complaints on 07/02/2020 and discharged home with pain prescription.  He returned for ongoing symptoms.  He underwent right lower extremity duplex on admission which was negative for DVT.He then underwent CT abdomen/pelvis which revealed right iliacus muscle noted with thickening and intramuscular hemorrhage as well as hematoma seen within the iliopsoas muscle and retroperitoneal hemorrhage tracking along the psoas and distributed throughout the right retroperitoneal spaces.  There was also trace intraperitoneal fluid/hemorrhage in the right paracolic gutter/deep pelvis.    PT Comments    Pt feeling better with decreased pain.  Assisted OOB using belt loop to self assist R LE off bed.  Assisted with amb an increased distance in hallway with walker.  Increase step length and < 25% VC's safety with turns. Returned to room and performed a few TE's using belt loop.  Addressed all mobility questions, discussed appropriate activity, educated on use of ICE.  Pt ready for D/C to home.   Follow Up Recommendations  Home health PT     Equipment Recommendations  Rolling walker with 5" wheels    Recommendations for Other Services       Precautions / Restrictions Precautions Precautions: Fall    Mobility  Bed Mobility Overal bed mobility: Modified Independent Bed Mobility: Supine to Sit;Sit to Supine           General bed mobility comments: using belt loop to self assist R LE and increased time  Transfers Overall transfer level: Needs assistance Equipment used: Rolling walker (2 wheeled) Transfers: Sit to/from [facility] Sit to Stand: Supervision Stand  pivot transfers: Supervision       General transfer comment: <25% VC's on safety with turns and increased time.  Some tendency to walk on outside of R foot  Ambulation/Gait Ambulation/Gait assistance: Supervision Gait Distance (Feet): 225 Feet Assistive device: Rolling walker (2 wheeled) Gait Pattern/deviations: Step-through pattern;Decreased stride length;Decreased stance time - right Gait velocity: decreased   General Gait Details: increased time and tolerated an increased distance   Stairs             Wheelchair Mobility    Modified Rankin (Stroke Patients Only)       Balance Overall balance assessment: Needs assistance Sitting-balance support: Feet supported;Single extremity supported;No upper extremity supported Sitting balance-Leahy Scale: Good Sitting balance - Comments: unable to wt shift d/t pain   Standing balance support: Bilateral upper extremity supported Standing balance-Leahy Scale: Fair Standing balance comment: reliant on UEs                            Cognition Arousal/Alertness: Awake/alert Behavior During Therapy: WFL for tasks assessed/performed Overall Cognitive Status: Within Functional Limits for tasks assessed                                 General Comments: AxO x 3 feeling "better" with less pain      Exercises  10 reps AP, knee presses and AAROM HS using belt    General Comments        Pertinent Vitals/Pain Pain Assessment: Faces (Simultaneous filing. User  may not have seen previous data.) Faces Pain Scale: Hurts a little bit Pain Location: right hip with activity Pain Descriptors / Indicators: Aching;Tender Pain Intervention(s): Monitored during session;Repositioned;Ice applied    Home Living                      Prior Function            PT Goals (current goals can now be found in the care plan section) Progress towards PT goals: Progressing toward goals    Frequency    Min  4X/week      PT Plan Current plan remains appropriate    Co-evaluation              AM-PAC PT "6 Clicks" Mobility   Outcome Measure  Help needed turning from your back to your side while in a flat bed without using bedrails?: None Help needed moving from lying on your back to sitting on the side of a flat bed without using bedrails?: None Help needed moving to and from a bed to a chair (including a wheelchair)?: None Help needed standing up from a chair using your arms (e.g., wheelchair or bedside chair)?: None Help needed to walk in hospital room?: A Little Help needed climbing 3-5 steps with a railing? : A Little 6 Click Score: 22    End of Session Equipment Utilized During Treatment: Gait belt Activity Tolerance: Patient tolerated treatment well Patient left: in bed;with call bell/phone within reach;with bed alarm set Nurse Communication: Mobility status PT Visit Diagnosis: Other abnormalities of gait and mobility (R26.89);Pain Pain - Right/Left: Right Pain - part of body: Hip;Leg     Time: 1045-1110 PT Time Calculation (min) (ACUTE ONLY): 25 min  Charges:  $Gait Training: 8-22 mins $Therapeutic Activity: 8-22 mins                     Rica Koyanagi  PTA Acute  Rehabilitation Services Pager      [phone] Office      [phone]

## 2020-07-07 NOTE — TOC Transition Note (Signed)
Transition of Care [facility]) - CM/SW Discharge Note   Patient Details  Name: Paul Luna MRN: [id number] Date of Birth: 1941/03/04  Transition of Care [facility]) CM/SW Contact:  Lia Hopping, [place] Phone Number: 07/07/2020, 10:46 AM   Clinical Narrative:    3 in 1 ordered and delivered to the patient bedside by [facility].    Final next level of care: [place] Barriers to Discharge: Barriers Resolved   Patient Goals and CMS Choice Patient states their goals for this hospitalization and ongoing recovery are:: to return home CMS Medicare.gov Compare Post Acute Care list provided to:: Patient Choice offered to / list presented to : Patient  Discharge Placement                       Discharge Plan and Services In-house Referral: Clinical Social Work   Post Acute Care Choice: Home Health          DME Arranged: 3-N-1 DME Agency: [facility] Date DME Agency Contacted: 07/07/20 Time DME Agency Contacted: 1000 Representative spoke with at DME Agency: Freda Munro Jeffers: PT, OT [facility] Agency: [place] ([place]) Date Page: 07/06/20 Time [place]: [place] Representative spoke with at No Name: [place] ([place]) Interventions     Readmission Risk Interventions No flowsheet data found.

## 2020-07-07 NOTE — Discharge Instructions (Signed)
Stay off of Coumadin (do not resume at discharge).   Follow up with Dr. Lindi Adie in about 1 month to discuss starting on alternative anticoagulation.

## 2020-07-07 NOTE — Discharge Summary (Addendum)
Physician Discharge Summary  Paul Luna EGB:[id number] DOB: 01-29-1941 DOA: 07/03/2020  PCP: Lilian Coma., MD  Admit date: 07/03/2020 Discharge date: 07/07/2020  Admitted From: home Disposition:  home Discharging physician: Dwyane Dee, MD  Recommendations for Outpatient Follow-up:  1. Follow up with hematology  Home Health: PT  Patient discharged to home in Discharge Condition: stable CODE STATUS: Full Diet recommendation:  Diet Orders (From admission, onward)    Start     Ordered   07/07/20 0000  Diet - low sodium heart healthy        07/07/20 1023   07/04/20 0032  Diet Heart Room service appropriate? Yes; Fluid consistency: Thin  Diet effective now       Question Answer Comment  Room service appropriate? Yes   Fluid consistency: Thin      07/04/20 0031          Hospital Course: Paul Luna is a 79 yo CM with PMH DVT/PE on chronic Coumadin (since 2010), UC, HTN, arthritis who presented with right thigh pain up to his groin.  He was seen at urgent care with similar complaints on 07/02/2020 and discharged home with pain prescription.  He returned for ongoing symptoms.  He underwent right lower extremity duplex on admission which was negative for DVT. He then underwent CT abdomen/pelvis which revealed right iliacus muscle noted with thickening and intramuscular hemorrhage as well as hematoma seen within the iliopsoas muscle and retroperitoneal hemorrhage tracking along the psoas and distributed throughout the right retroperitoneal spaces.  There was also trace intraperitoneal fluid/hemorrhage in the right paracolic gutter/deep pelvis.  His INR on admission was 5 and he underwent reversal with [facility] and vitamin K 10 mg x 1.  Repeat INR was 1.6 and remained reversed with trending.  He was hemodynamically stable.  Hemoglobin was also trended on admission.  Due to his CT findings, surgery was also consulted for further recommendations. Hematology was consulted regarding further  recommendations regarding resuming AC vs alternative drug choices if needed as well. His INR remained stable and his clinical status did not worsen. PT was consulted for evaluating patient's mobility status given his RLE pain interfering with his ability to ambulate well.   He improved clinically with ongoing monitoring. Hgb remained stable and right leg/thigh pain improved slowly.  At discharge, he will follow-up with hematology after approximately 3 to 4 weeks for discussion regarding resumption of anticoagulation.  A lupus anticoagulant panel was ordered during hospitalization and results will be discussed at follow-up appointment with hematology as well.  He was considered stable for discharging home, understands the plan for holding his Coumadin and will follow up outpatient.   * Retroperitoneal hematoma - CT abdomen/pelvis revealed right iliacus muscle noted with thickening and intramuscular hemorrhage as well as hematoma seen within the iliopsoas muscle and retroperitoneal hemorrhage tracking along the psoas and distributed throughout the right retroperitoneal spaces.  There was also trace intraperitoneal fluid/hemorrhage in the right paracolic gutter/deep pelvis. - surgery consulted, appreciate assistance; patient has no surgical needs and remains stable. Surgery signing off due to stability  - Hgb stable.   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 INR has been reversed with [facility] and Vit K on admission  - outpatient follow up with hematology to discuss anticoagulation;  patient still wishes to resume likely on a DOAC and very much understands risks and benefits of long term anticoagulation   Psoas hematoma, right, secondary to anticoagulant therapy, initial encounter - Patient has pain extending down into right thigh. Currently no s/s compartment syndrome - continue to monitor for any developing signs of compartment syndrome in RLE - PT eval as pain is limiting mobility; will be okay for HHPT with eval - tylenol recommended at discharge; his pain improved significantly prior to discharge    The patient's chronic medical conditions were treated accordingly per the patient's home medication regimen except as noted.  On day of discharge, patient was felt deemed stable for discharge. Patient/family member advised to call PCP or come back to ER if needed.   Discharge Diagnoses:   Principal Diagnosis: Retroperitoneal hematoma  Active Hospital Problems   Diagnosis Date Noted  . Retroperitoneal hematoma 07/04/2020    Priority: High  . Psoas hematoma, right, secondary to anticoagulant therapy, initial encounter 07/04/2020    Priority: Medium  . History of pulmonary embolus (PE) 07/04/2020    Priority: Medium  . Ulcerative colitis ([place]) 04/07/2020    Resolved Hospital Problems  No resolved problems to display.    Discharge Instructions    Diet - low sodium heart healthy   Complete by: As directed    Increase activity slowly   Complete by: As directed      Allergies as of 07/07/2020   No Known Allergies     Medication List    STOP taking these medications   HYDROcodone-acetaminophen 5-325 MG tablet Commonly known as: NORCO/VICODIN   Jantoven 6 MG tablet Generic drug: warfarin     TAKE these medications   acetaminophen 500 MG tablet Commonly known as: TYLENOL Take 2 tablets (1,000 mg total) by mouth every 6 (six) hours as needed (for pain).   ascorbic acid 500 MG tablet Commonly known as: VITAMIN C Take 500 mg by mouth daily.    budesonide 3 MG 24 hr capsule Commonly known as: ENTOCORT EC Take 9 mg by mouth in the morning and at bedtime. Dose will be tapered monthly: 9 mg twice daily for 30 days then 9 mg once Daily for 30 days, Then 6 mg once daily for 30 days, then continue 3 mg once daily   CALCIUM 600 PO Take 1 tablet by mouth daily.   ENTYVIO IV Inject into the vein.   ezetimibe 10 MG tablet Commonly known as: ZETIA Take 1 tablet by mouth daily.   FISH OIL PO Take 550 mg by mouth daily.   lidocaine 5 % Commonly known as: Lidoderm Place 1 patch onto the skin daily. Remove & Discard patch within 12 hours or as directed by MD   oxyCODONE 5 MG immediate release tablet Commonly known as: Roxicodone Take 1 tablet (5 mg total) by mouth every 4 (four) hours as needed for severe pain or breakthrough pain.   sertraline 100 MG tablet Commonly known as: ZOLOFT Take 200 mg by mouth daily.   simvastatin 40 MG tablet Commonly known as: ZOCOR Take 1 tablet by mouth daily.   VITAMIN D-3 PO Take 1 tablet by mouth daily.            Durable Medical Equipment  (From admission, onward)         Start  Ordered   07/06/20 1647  For home use only DME 3 n 1  Once        07/06/20 1647   07/06/20 1117  For home use only DME Walker rolling  Once       Question Answer Comment  Walker: With [place]   Patient needs a walker to treat with the following condition Hematoma of leg, right, initial encounter      07/06/20 1117          Follow-up Information    Nicholas Lose, MD. Schedule an appointment as soon as possible for a visit in 1 month(s).   Specialty: Hematology and Oncology Contact information: [place] [address] [phone]              No Known Allergies  Consultations: Hematology General surgery  Discharge Exam: BP (!) 149/79 (BP Location: Right Arm)   Pulse 70   Temp 98.2 F (36.8 C) (Oral)   Resp 17   Ht 6\' 1"  (1.854 m)   Wt 96.2  kg   SpO2 95%   BMI 27.97 kg/m  General appearance: alert, cooperative and no distress Head: Normocephalic, without obvious abnormality, atraumatic Eyes: EOMI Lungs: clear to auscultation bilaterally Heart: regular rate and rhythm and S1, S2 normal Abdomen: normal findings: bowel sounds normal and soft, non-tender Extremities: RLE is TTP; compartment is soft with no signs of compartment. Skin: mobility and turgor normal Neurologic: paresthesia better in RLE, no true weakness and no obvious focal deficits  The results of significant diagnostics from this hospitalization (including imaging, microbiology, ancillary and laboratory) are listed below for reference.   Microbiology: Recent Results (from the past 240 hour(s))  Respiratory Panel by RT PCR (Flu A&B, Covid) - Nasopharyngeal Swab     Status: None   Collection Time: 07/04/20 12:09 AM   Specimen: Nasopharyngeal Swab  Result Value Ref Range Status   SARS Coronavirus 2 by RT PCR NEGATIVE NEGATIVE Final    Comment: (NOTE) SARS-CoV-2 target nucleic acids are NOT DETECTED.  The SARS-CoV-2 RNA is generally detectable in upper respiratoy specimens during the acute phase of infection. The lowest concentration of SARS-CoV-2 viral copies this assay can detect is 131 copies/mL. A negative result does not preclude SARS-Cov-2 infection and should not be used as the sole basis for treatment or other patient management decisions. A negative result may occur with  improper specimen collection/handling, submission of specimen other than nasopharyngeal swab, presence of viral mutation(s) within the areas targeted by this assay, and inadequate number of viral copies (<131 copies/mL). A negative result must be combined with clinical observations, patient history, and epidemiological information. The expected result is Negative.  Fact Sheet for Patients:  PinkCheek.be  Fact Sheet for Healthcare Providers:   GravelBags.it  This test is no t yet approved or cleared by the [place] FDA and  has been authorized for detection and/or diagnosis of SARS-CoV-2 by FDA under an Emergency Use Authorization (EUA). This EUA will remain  in effect (meaning this test can be used) for the duration of the COVID-19 declaration under Section 564(b)(1) of the Act, 21 U.S.C. section 360bbb-3(b)(1), unless the authorization is terminated or revoked sooner.     Influenza A by PCR NEGATIVE NEGATIVE Final   Influenza B by PCR NEGATIVE NEGATIVE Final    Comment: (NOTE) The Xpert Xpress SARS-CoV-2/FLU/RSV assay is intended as an aid in  the diagnosis of influenza from Nasopharyngeal swab specimens and  should not be  used as a sole basis for treatment. Nasal washings and  aspirates are unacceptable for Xpert Xpress SARS-CoV-2/FLU/RSV  testing.  Fact Sheet for Patients: PinkCheek.be  Fact Sheet for Healthcare Providers: GravelBags.it  This test is not yet approved or cleared by the [place] FDA and  has been authorized for detection and/or diagnosis of SARS-CoV-2 by  FDA under an Emergency Use Authorization (EUA). This EUA will remain  in effect (meaning this test can be used) for the duration of the  Covid-19 declaration under Section 564(b)(1) of the Act, 21  U.S.C. section 360bbb-3(b)(1), unless the authorization is  terminated or revoked. Performed at [facility], [place] [address] [place], [place] [address]      Labs: BNP (last 3 results) No results for input(s): BNP in the last 8760 hours. Basic Metabolic Panel: Recent Labs  Lab 07/03/20 2029 07/04/20 0956 07/05/20 0312 07/06/20 0419 07/07/20 0403  NA 139 137 140 141 139  K 3.5 3.6 3.7 3.7 4.1  CL 105 104 103 105 102  CO2 21* 23 27 26 26   GLUCOSE 120* 110* 122* 131* 130*  BUN 19 17 19 19  28*  CREATININE 0.84 0.82 1.03 0.80  0.96  CALCIUM 8.8* 8.1* 8.6* 8.1* 8.4*  MG  --   --  2.1 2.0 1.9   Liver Function Tests: Recent Labs  Lab 07/03/20 2029  AST 18  ALT 16  ALKPHOS 55  BILITOT 0.9  PROT 6.8  ALBUMIN 4.1   Recent Labs  Lab 07/03/20 2029  LIPASE 27   No results for input(s): AMMONIA in the last 168 hours. CBC: Recent Labs  Lab 07/03/20 2029 07/04/20 0956 07/05/20 0312 07/06/20 0419 07/07/20 0403  WBC 8.6 8.0 9.5 7.6 8.3  NEUTROABS 6.2  --  7.1 6.2 6.6  HGB 12.9* 12.0* 12.6* 11.8* 11.7*  HCT 37.7* 35.4* 38.6* 35.6* 35.3*  MCV 89.5 92.4 94.1 94.2 93.9  PLT 215 185 222 183 209   Cardiac Enzymes: No results for input(s): CKTOTAL, CKMB, CKMBINDEX, TROPONINI in the last 168 hours. BNP: Invalid input(s): POCBNP CBG: No results for input(s): GLUCAP in the last 168 hours. D-Dimer No results for input(s): DDIMER in the last 72 hours. Hgb A1c No results for input(s): HGBA1C in the last 72 hours. Lipid Profile No results for input(s): CHOL, HDL, LDLCALC, TRIG, CHOLHDL, LDLDIRECT in the last 72 hours. Thyroid function studies No results for input(s): TSH, T4TOTAL, T3FREE, THYROIDAB in the last 72 hours.  Invalid input(s): FREET3 Anemia work up No results for input(s): VITAMINB12, FOLATE, FERRITIN, TIBC, IRON, RETICCTPCT in the last 72 hours. Urinalysis    Component Value Date/Time   BILIRUBINUR neg 12/12/2012 1121   PROTEINUR neg 12/12/2012 1121   UROBILINOGEN 0.2 12/12/2012 1121   NITRITE neg 12/12/2012 1121   LEUKOCYTESUR Negative 12/12/2012 1121   Sepsis Labs Invalid input(s): PROCALCITONIN,  WBC,  LACTICIDVEN Microbiology Recent Results (from the past 240 hour(s))  Respiratory Panel by RT PCR (Flu A&B, Covid) - Nasopharyngeal Swab     Status: None   Collection Time: 07/04/20 12:09 AM   Specimen: Nasopharyngeal Swab  Result Value Ref Range Status   SARS Coronavirus 2 by RT PCR NEGATIVE NEGATIVE Final    Comment: (NOTE) SARS-CoV-2 target nucleic acids are NOT DETECTED.  The  SARS-CoV-2 RNA is generally detectable in upper respiratoy specimens during the acute phase of infection. The lowest concentration of SARS-CoV-2 viral copies this assay can detect is 131 copies/mL. A negative result does not preclude SARS-Cov-2  infection and should not be used as the sole basis for treatment or other patient management decisions. A negative result may occur with  improper specimen collection/handling, submission of specimen other than nasopharyngeal swab, presence of viral mutation(s) within the areas targeted by this assay, and inadequate number of viral copies (<131 copies/mL). A negative result must be combined with clinical observations, patient history, and epidemiological information. The expected result is Negative.  Fact Sheet for Patients:  PinkCheek.be  Fact Sheet for Healthcare Providers:  GravelBags.it  This test is no t yet approved or cleared by the [place] FDA and  has been authorized for detection and/or diagnosis of SARS-CoV-2 by FDA under an Emergency Use Authorization (EUA). This EUA will remain  in effect (meaning this test can be used) for the duration of the COVID-19 declaration under Section 564(b)(1) of the Act, 21 U.S.C. section 360bbb-3(b)(1), unless the authorization is terminated or revoked sooner.     Influenza A by PCR NEGATIVE NEGATIVE Final   Influenza B by PCR NEGATIVE NEGATIVE Final    Comment: (NOTE) The Xpert Xpress SARS-CoV-2/FLU/RSV assay is intended as an aid in  the diagnosis of influenza from Nasopharyngeal swab specimens and  should not be used as a sole basis for treatment. Nasal washings and  aspirates are unacceptable for Xpert Xpress SARS-CoV-2/FLU/RSV  testing.  Fact Sheet for Patients: PinkCheek.be  Fact Sheet for Healthcare Providers: GravelBags.it  This test is not yet approved or cleared  by the [place] FDA and  has been authorized for detection and/or diagnosis of SARS-CoV-2 by  FDA under an Emergency Use Authorization (EUA). This EUA will remain  in effect (meaning this test can be used) for the duration of the  Covid-19 declaration under Section 564(b)(1) of the Act, 21  U.S.C. section 360bbb-3(b)(1), unless the authorization is  terminated or revoked. Performed at [facility], [place] [address], [place], [place] [address]     Procedures/Studies: CT Abdomen Pelvis W Contrast  Result Date: 07/03/2020 CLINICAL DATA:  Right lower quadrant abdominal pain EXAM: CT ABDOMEN AND PELVIS WITH CONTRAST TECHNIQUE: Multidetector CT imaging of the abdomen and pelvis was performed using the standard protocol following bolus administration of intravenous contrast. CONTRAST:  190mL OMNIPAQUE IOHEXOL 300 MG/ML  SOLN COMPARISON:  Contemporary lumbar CT reconstruction, MR lumbar spine 04/03/2013 FINDINGS: Lower chest: Bandlike areas of opacity in the lung bases, likely atelectasis or scarring. No consolidation or effusion is seen. Normal heart size. No pericardial effusion. Hepatobiliary: No worrisome focal liver lesions. Smooth liver surface contour. Normal hepatic attenuation. Physiologic distension of the gallbladder. No calcified gallstones or pericholecystic inflammation. No biliary ductal dilatation. Pancreas: Partial fatty replacement of the pancreas. No pancreatic ductal dilatation or surrounding inflammatory changes. Spleen: Normal in size. No concerning splenic lesions. Adrenals/Urinary Tract: Normal adrenal glands. Kidneys are normally located with symmetric enhancement and excretion. No suspicious renal lesion, urolithiasis or hydronephrosis. Moderate distention of the urinary bladder without other gross abnormality. Stomach/Bowel: Small sliding-type hiatal hernia. Distal stomach is unremarkable. Intermediate attenuation fluid along the duodenal sweep may be  redistributed hemorrhage within the retroperitoneum. No focal bowel wall and abnormality in this vicinity. No extraluminal air. No small bowel thickening or dilatation. No colonic dilatation or wall thickening. Trace free fluid is seen in the right paracolic gutter, possibly redistributed as well. A normal appendix is visualized. No colonic dilatation or wall thickening. Vascular/Lymphatic: There are multiple serpiginous blushes of hyperattenuation within the right iliacus musculature concerning for sites of active contrast  extravasation. No other sites concerning for active contrast extravasation. No direct vascular injury is identified. Atherosclerotic calcifications within the abdominal aorta and branch vessels. No aneurysm or ectasia. No enlarged abdominopelvic lymph nodes. Reproductive: Prostate appears borderline enlarged with heterogeneous calcifications though largely obscured by streak artifact from bilateral hip prostheses. Trace right hydrocele is noted. No other acute abnormality of the external genitalia. Other: There is hemorrhage and fluid predominantly throughout the right retroperitoneum which likely arises from the sites of active extravasation in the right iliacus as detailed above. This fluid in hemorrhage does appear to redistributed into the posterior and anterior pararenal space as well as about the duodenum as denoted above. Musculoskeletal: There sites of contrast blush within the right iliacus muscle with additional heterogeneous hemorrhage along the psoas more proximally as well as what is likely a layering intramuscular hemorrhage of the more inferior iliopsoas muscle though partially obscured by adjacent streak artifact from the right hip prosthesis, series 2, image 87. 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 And acetabular component fixation screw in the right stands proud of the superior acetabular cortex (6/113) similarly, a fixation screw in the left closely  approximates the cortex along the left ilium (6/100). No other acute or worrisome abnormalities of bony pelvis. Dedicated lumbar reconstructions are generated and dictated separately. Please see report for further details. In brief, there appears to be multilevel discogenic and facet degenerative changes as well as several small vertebral body hemangiomata but without acute osseous injury or suspicious osseous lesion. IMPRESSION: 1. Multiple serpiginous blushes of hyperattenuation within the right iliacus muscle concerning for sites of active contrast extravasation with extensive thickening and intramuscular hemorrhage. Layering intramuscular hematoma is seen within the iliopsoas muscle of the anterior proximal thigh as well as additional retroperitoneal hemorrhage tracking superiorly along the psoas and distributed throughout the right retroperitoneal spaces. 2. Hyperdense hemorrhage is present in the retroperitoneum along the duodenal sweep though this is favored to be redistributed without acute abnormality of the bowel or extraluminal gas. 3. Trace intraperitoneal fluid/hemorrhage in the right paracolic gutter and deep pelvis, possibly redistributed as well. 4. No other acute abnormality in the abdomen or pelvis. Specifically, the appendix is normal. 5. 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 6. Dedicated lumbar reconstructions are generated and dictated separately. 7. Aortic Atherosclerosis (ICD10-I70.0). These results were called by telephone at the time of interpretation on 07/03/2020 at 10:56 pm to provider Dr Maryan Rued, who verbally acknowledged these results. Electronically Signed   By: Lovena Le M.D.   On: 07/03/2020 22:57   CT L-SPINE NO CHARGE  Result Date: 07/03/2020 CLINICAL DATA:  Right upper leg pain EXAM: CT Lumbar spine without contrast TECHNIQUE: Multiplanar CT images of the lumbar spine were reconstructed from contemporary CT of  the Chest, Abdomen, and Pelvis COMPARISON:  Contemporary CT abdomen pelvis, MR lumbar spine 04/03/2013, radiographs 04/08/2013 FINDINGS: CT LUMBAR SPINE FINDINGS Segmentation: 5 normally formed lumbar type vertebral levels. Lowest fully formed disc space denoted as L5-S1. Partial sacralization of the L5 transverse processes is noted incidentally, benign variant. Alignment: Straightening of the normal lumbar lordosis. No significant spondylolisthesis or spondylolysis. No abnormally widened, perched or jumped facets. Vertebrae: No acute fracture or vertebral body height loss is evident. Benign vertebral body hemangiomata are present in the T12, L2 and L3 levels, seen on comparison MRI. No worrisome lytic or blastic lesions are seen. Some fairly prominent Schmorl's node formations are noted in the anterior inferior endplate L4, at  least several of which are new from comparison in 2014. 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 Some predominantly anterior endplate spurring is noted. Multilevel discogenic and facet degenerative changes are present, further detailed below. 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 Paraspinal and other soft tissues: No perispinal fluid, swelling, gas or hemorrhage. No visible canal hematoma. Retroperitoneal hemorrhage centered within the right iliacus at a site of active contrast extravasation is better detailed on contemporary CT of the abdomen and pelvis, dictated separately. Aortoiliac atherosclerosis noted as well. For additional findings in the abdomen and pelvis, please see dedicated CT from which this study is reconstructed. Disc levels: Level by level evaluation of the lumbar spine below: T11-T12: Mild disc height loss without posterior disc abnormality. Mild bilateral facet arthropathy. No significant spinal canal or foraminal stenosis. T12-L1: Mild disc height loss without significant posterior disc  abnormality. Mild bilateral facet arthropathy. No significant spinal canal or foraminal stenosis. L1-L2: Mild to moderate disc height loss with global disc bulge and mild bilateral facet arthropathy. At most mild canal stenosis and partial effacement of the lateral recesses. No significant foraminal narrowing. L2-L3: Moderate disc height loss with asymmetric global disc bulge eccentric to the left subarticular zone with moderate bilateral facet arthropathy. Mild canal stenosis and near complete effacement of the left lateral recess. No significant foraminal impingement. L3-L4: Moderate disc height loss with global disc bulge and a superimposed inferiorly directed disc extrusion extending 6 mm below the disc space. Mild canal stenosis and some asymmetric effacement of the right lateral recess. No significant foraminal impingement. L4-L5: Moderate to severe disc height loss with disc desiccation and vacuum disc phenomenon as well as a global disc bulge asymmetric to the right subarticular zone with central disc protrusion and moderate to severe bilateral facet arthropathy resulting and some moderate canal stenosis, severe lateral recess narrowing, and moderate bilateral foraminal narrowing, left slightly greater than right. L5-S1: Severe disc height loss, disc desiccation and vacuum disc with Modic type endplate changes as well as a global disc bulge and severe bilateral facet arthropathy. Findings result in some mild canal stenosis and effacement of the lateral recesses as well as moderate to severe bilateral foraminal narrowing. IMPRESSION: 1. No acute fracture or vertebral body height loss identified. 2. Benign vertebral body hemangiomata T12, L2 and L3. 3. Multilevel degenerative changes of the lumbar spine as described level by level above. Features maximal L4-S1. Stenotic changes are likely accentuated by what appear to be congenitally shortened pedicles. 4. Interspinous arthrosis compatible with Baastrup's  disease. 5. Retroperitoneal hemorrhage centered within the right iliacus in distributed throughout the retroperitoneum better detailed on dedicated CT from which this study is reconstructed. Electronically Signed   By: Lovena Le M.D.   On: 07/03/2020 23:11   VAS [place] LOWER EXTREMITY VENOUS (DVT) (ONLY [facility] & [facility])  Result Date: 07/04/2020  Lower Venous DVTStudy Indications: Pain in right thigh.  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 Comparison Study: 04-10-2013 LT lower extremity study available. Performing Technologist: Darlin Coco  Examination Guidelines: A complete evaluation includes B-mode imaging, spectral Doppler, color Doppler, and power Doppler as needed of all accessible portions of each vessel. Bilateral testing is considered an integral part of a complete examination. Limited examinations for reoccurring indications may be performed as noted. The reflux portion of the exam is performed with the patient in reverse Trendelenburg.  +---------+---------------+---------+-----------+----------+------------------+ RIGHT    CompressibilityPhasicitySpontaneityPropertiesThrombus Aging     +---------+---------------+---------+-----------+----------+------------------+ CFV  Full           Yes      Yes                                     +---------+---------------+---------+-----------+----------+------------------+ SFJ      Full                                                            +---------+---------------+---------+-----------+----------+------------------+ FV Prox  Full                                                            +---------+---------------+---------+-----------+----------+------------------+ FV Mid   Full                                                            +---------+---------------+---------+-----------+----------+------------------+ FV DistalFull                                                             +---------+---------------+---------+-----------+----------+------------------+ PFV      Full                                                            +---------+---------------+---------+-----------+----------+------------------+ POP      Full           Yes      Yes                  Fibrin stranding                                                         noted              +---------+---------------+---------+-----------+----------+------------------+ PTV      Full                                                            +---------+---------------+---------+-----------+----------+------------------+ PERO     Full                                                            +---------+---------------+---------+-----------+----------+------------------+   +----+---------------+---------+-----------+----------+--------------+  LEFTCompressibilityPhasicitySpontaneityPropertiesThrombus Aging +----+---------------+---------+-----------+----------+--------------+ CFV Full           Yes      Yes                                 +----+---------------+---------+-----------+----------+--------------+     Summary: RIGHT: - There is no evidence of deep vein thrombosis in the lower extremity.  - No cystic structure found in the popliteal fossa.  LEFT: - No evidence of common femoral vein obstruction.  *See table(s) above for measurements and observations. Electronically signed by Harold Barban MD on 07/04/2020 at 1:09:53 PM.    Final      Time coordinating discharge: Over 45 minutes    Dwyane Dee, MD  [facility] 07/07/2020, 1:19 PM Pager: Secure chat  If 7PM-7AM, please contact night-coverage [email or web address] Password [email or web address]

## 2020-07-07 NOTE — Care Management Important Message (Signed)
Important Message  Patient Details IM Letter given to the Patient Name: Paul Luna MRN: [id number] Date of Birth: Aug 31, 1941   Medicare Important Message Given:  Yes     Kerin Salen 07/07/2020, 11:09 AM

## 2020-07-08 ENCOUNTER — Telehealth: Payer: Self-pay | Admitting: Hematology and Oncology

## 2020-07-08 ENCOUNTER — Ambulatory Visit: Payer: Medicare Other | Admitting: Family Medicine

## 2020-07-08 NOTE — Telephone Encounter (Signed)
Received a call from Mr. Paul Luna to schedule a hospital follow up appt for pulmonary embolism. Pt has been been scheduled to see Dr. Lindi Adie on 11/4 at 9:15am.

## 2020-07-20 ENCOUNTER — Other Ambulatory Visit: Payer: Self-pay

## 2020-07-20 ENCOUNTER — Ambulatory Visit (HOSPITAL_COMMUNITY)
Admission: RE | Admit: 2020-07-20 | Discharge: 2020-07-20 | Disposition: A | Discharge status: Home / Self Care | Payer: Medicare Other | Source: Ambulatory Visit | Attending: Gastroenterology | Admitting: Gastroenterology

## 2020-07-20 DIAGNOSIS — K5100 Ulcerative (chronic) pancolitis without complications: Secondary | ICD-10-CM

## 2020-07-20 MED ORDER — VEDOLIZUMAB 300 MG IV SOLR
300.0000 mg | INTRAVENOUS | Status: DC
  Administered 2020-07-20: 300 mg via INTRAVENOUS
  Filled 2020-07-20: qty 5

## 2020-08-05 NOTE — Progress Notes (Signed)
Patient Care Team: Lilian Coma., MD as PCP - General (Internal Medicine) Calvert Cantor, MD as Consulting Physician (Ophthalmology) Earnie Larsson, MD as Consulting Physician (Neurosurgery) Everardo Pacific, MD (Urology) Laurence Spates, MD (Inactive) as Consulting Physician (Gastroenterology)  DIAGNOSIS:    ICD-10-CM   1. Retroperitoneal hematoma  K66.1   2. 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 with above-mentioned history of DVT and PE who had been on Coumadin since 2010. He presented with right thigh pain up to his groin and [place] was negative for DVT. He was admitted and CT showed right iliacus muscle noted with thickening and intramuscular hemorrhage as well as hematoma seen within the iliopsoas muscle and retroperitoneal hemorrhage tracking along the psoas and distributed throughout the right retroperitoneal spaces. There was also trace intraperitoneal fluid/hemorrhage in the right paracolic gutter/deep pelvis. Lupus anticoagulant testing was negative. He was discharged to hold Coumadin. He presents to the clinic today for follow-up.  He is complaining of new onset of left lower extremity swelling as well as nodular and tender spots in the left medial leg.  ALLERGIES:  has No Known Allergies.  MEDICATIONS:  Current Outpatient Medications  Medication Sig Dispense Refill  . acetaminophen (TYLENOL) 500 MG tablet Take 2 tablets (1,000 mg total) by mouth every 6 (six) hours as needed (for pain). 100 tablet 2  . ascorbic acid (VITAMIN C) 500 MG tablet Take 500 mg by mouth daily.    . budesonide (ENTOCORT EC) 3 MG 24 hr capsule Take 9 mg by mouth in the morning and at bedtime. Dose will be tapered monthly: 9 mg twice daily for 30 days then 9 mg once Daily for 30 days,  Then 6 mg once daily for 30 days, then continue 3 mg once daily 330 capsule 3  . Calcium Carbonate (CALCIUM 600 PO) Take 1 tablet by mouth daily.    . Cholecalciferol (VITAMIN D-3 PO) Take 1 tablet by mouth daily.    Marland Kitchen enoxaparin (LOVENOX) 150 MG/ML injection Inject 1 mL (150 mg total) into the skin daily. 5 mL 0  . ezetimibe (ZETIA) 10 MG tablet Take 1 tablet by mouth daily.    Marland Kitchen lidocaine (LIDODERM) 5 % Place 1 patch onto the skin daily. Remove & Discard patch within 12 hours or as directed by MD 7 patch 0  . Omega-3 Fatty Acids (FISH OIL PO) Take 550 mg by mouth daily.    Marland Kitchen oxyCODONE (ROXICODONE) 5 MG immediate release tablet Take 1 tablet (5 mg total) by mouth every 4 (four) hours as needed for severe pain or breakthrough pain. 30 tablet 0  . sertraline (ZOLOFT) 100 MG tablet Take 200 mg by mouth daily.    . simvastatin (ZOCOR) 40 MG tablet Take 1 tablet by mouth daily.    . Vedolizumab (ENTYVIO IV) Inject into the vein.    Marland Kitchen warfarin (COUMADIN) 5 MG tablet Take 1 tablet (5 mg total) by mouth daily. 90 tablet 3   No current facility-administered medications for this visit.    PHYSICAL EXAMINATION: ECOG PERFORMANCE STATUS: 1 - Symptomatic but completely ambulatory  Vitals:   08/06/20 0934  BP: (!) 158/80  Pulse: 94  Resp: 18  Temp: 98.6 F (37 C)  SpO2: 96%   Filed Weights   08/06/20 0934  Weight:  206 lb 8 oz (93.7 kg)     LABORATORY DATA:  I have reviewed the data as listed CMP Latest Ref Rng & Units 07/07/2020 07/06/2020 07/05/2020  Glucose 70 - 99 mg/dL 130(H) 131(H) 122(H)  BUN 8 - 23 mg/dL 28(H) 19 19  Creatinine 0.61 - 1.24 mg/dL 0.96 0.80 1.03  Sodium 135 - 145 mmol/L 139 141 140  Potassium 3.5 - 5.1 mmol/L 4.1 3.7 3.7  Chloride 98 - 111 mmol/L 102 105 103  CO2 22 - 32 mmol/L 26 26 27   Calcium 8.9 - 10.3 mg/dL 8.4(L) 8.1(L) 8.6(L)  Total Protein 6.5 - 8.1 g/dL - - -  Total Bilirubin 0.3 - 1.2 mg/dL - - -  Alkaline Phos 38 - 126 U/L - - -  AST 15 - 41 U/L - - -    ALT 0 - 44 U/L - - -    Lab Results  Component Value Date   WBC 8.3 07/07/2020   HGB 11.7 (L) 07/07/2020   HCT 35.3 (L) 07/07/2020   MCV 93.9 07/07/2020   PLT 209 07/07/2020   NEUTROABS 6.6 07/07/2020    ASSESSMENT & PLAN:  DVT (deep venous thrombosis) History of DVT on Coumadin previously Hospitalization October 2021: Retroperitoneal bleed extending into the thigh Severe intractable pain in the leg: Improved with muscle relaxants and discontinuation of Coumadin. Lupus anticoagulant: Negative  New left leg DVT: Since he got discharged from the hospital he has noticed left leg swelling and he came to our office with left lower extremity edema and urgent ultrasound was obtained which revealed left lower extremity DVT.  We discussed the pros and cons of different anticoagulants and based on his preference, we started him on Coumadin.  He wants to monitor his INR through his primary care physician.  We will call their office to inform them of him starting back on Coumadin.  To start 5 mg daily.  I also sent him 5 injections of Lovenox 150 mg, which he will administer himself.  He has done that before and is quite knowledgeable about it.  Return to clinic in 3 months with ultrasound of the left leg and follow-up to review.  No orders of the defined types were placed in this encounter.  The patient has a good understanding of the overall plan. he agrees with it. he will call with any problems that may develop before the next visit here.  Total time spent: 30 mins including face to face time and time spent for planning, charting and coordination of care  Nicholas Lose, MD 08/06/2020  I, Cloyde Reams Dorshimer, am acting as scribe for Dr. Nicholas Lose.  I have reviewed the above documentation for accuracy and completeness, and I agree with the above.

## 2020-08-06 ENCOUNTER — Encounter: Payer: Self-pay | Admitting: *Deleted

## 2020-08-06 ENCOUNTER — Ambulatory Visit: Payer: Medicare Other | Admitting: Gastroenterology

## 2020-08-06 ENCOUNTER — Ambulatory Visit (HOSPITAL_COMMUNITY)
Admission: RE | Admit: 2020-08-06 | Discharge: 2020-08-06 | Disposition: A | Discharge status: Home / Self Care | Payer: Medicare Other | Source: Ambulatory Visit | Attending: Hematology and Oncology | Admitting: Hematology and Oncology

## 2020-08-06 ENCOUNTER — Other Ambulatory Visit: Payer: Self-pay | Admitting: *Deleted

## 2020-08-06 ENCOUNTER — Other Ambulatory Visit: Payer: Self-pay

## 2020-08-06 ENCOUNTER — Inpatient Hospital Stay: Payer: Medicare Other | Attending: Hematology and Oncology | Admitting: Hematology and Oncology

## 2020-08-06 VITALS — BP 158/80 | HR 94 | Temp 98.6°F | Resp 18 | Ht 73.0 in | Wt 206.5 lb

## 2020-08-06 DIAGNOSIS — Z79899 Other long term (current) drug therapy: Secondary | ICD-10-CM | POA: Diagnosis not present

## 2020-08-06 DIAGNOSIS — I825Z9 Chronic embolism and thrombosis of unspecified deep veins of unspecified distal lower extremity: Secondary | ICD-10-CM

## 2020-08-06 DIAGNOSIS — Z86718 Personal history of other venous thrombosis and embolism: Secondary | ICD-10-CM | POA: Insufficient documentation

## 2020-08-06 DIAGNOSIS — K661 Hemoperitoneum: Secondary | ICD-10-CM | POA: Diagnosis present

## 2020-08-06 DIAGNOSIS — Z7901 Long term (current) use of anticoagulants: Secondary | ICD-10-CM | POA: Diagnosis not present

## 2020-08-06 DIAGNOSIS — I82402 Acute embolism and thrombosis of unspecified deep veins of left lower extremity: Secondary | ICD-10-CM | POA: Insufficient documentation

## 2020-08-06 MED ORDER — WARFARIN SODIUM 5 MG PO TABS: 5.0000 mg | ORAL_TABLET | Freq: Every day | ORAL | 3 refills | Status: DC

## 2020-08-06 MED ORDER — ENOXAPARIN SODIUM 150 MG/ML ~~LOC~~ SOLN: 150.0000 mg | SUBCUTANEOUS | 0 refills | Status: DC

## 2020-08-06 NOTE — Assessment & Plan Note (Signed)
History of DVT on Coumadin previously Hospitalization October 2021: Retroperitoneal bleed extending into the thigh Severe intractable pain in the leg: Improved with muscle relaxants and discontinuation of Coumadin. Lupus anticoagulant: Negative Coumadin has been discontinued  We do not need to see the patient any further since he is clinically asymptomatic and doing quite well.

## 2020-08-06 NOTE — Progress Notes (Signed)
Bilateral Lower Ext. study completed.   See CVProc for preliminary results.   Ulyssa Walthour, RDMS, RVT 

## 2020-08-06 NOTE — Progress Notes (Signed)
Per MD request RN successfully faxed recent office note and Vas [place] results to pt PCP Dr. Valora Piccolo [phone]).  Pt diagnosed with new LLE DVT and is on Lovenox-Warfarin bridge.  Pt requesting PCP to monitor Warfarin INR and Warfarin dosage.  RN attempt x1 to contact RN with PCP.  LVM to return call to our office.

## 2020-08-07 ENCOUNTER — Encounter: Payer: Self-pay | Admitting: Hematology and Oncology

## 2020-08-07 ENCOUNTER — Telehealth: Payer: Self-pay | Admitting: *Deleted

## 2020-08-07 NOTE — Telephone Encounter (Signed)
error 

## 2020-08-19 ENCOUNTER — Other Ambulatory Visit: Payer: Medicare Other

## 2020-08-19 ENCOUNTER — Ambulatory Visit (INDEPENDENT_AMBULATORY_CARE_PROVIDER_SITE_OTHER): Payer: Medicare Other | Admitting: Gastroenterology

## 2020-08-19 ENCOUNTER — Encounter: Payer: Self-pay | Admitting: Gastroenterology

## 2020-08-19 VITALS — BP 102/64 | HR 93 | Ht 73.0 in | Wt 206.6 lb

## 2020-08-19 DIAGNOSIS — K5100 Ulcerative (chronic) pancolitis without complications: Secondary | ICD-10-CM

## 2020-08-19 NOTE — Progress Notes (Signed)
Paul Luna    [id number]    [birth year]  Primary Care Physician:[name], [name], MD  Referring Physician: Lilian Coma., MD No address on file   Chief complaint: Ulcerative colitis  HPI:  79 year old very pleasant gentleman here for follow-up visit for ulcerative pancolitis He is doing overall well.  His symptoms are stable  Colonoscopy June 11, 2020: Showed pancolitis, biopsies showed mildly active ulcerative colitis in the right colon and moderately active ulcerative colitis in the left colon.  Sigmoid diverticulosis and internal hemorrhoids He was taking budesonide 3 mg daily for maintenance  He is currently taking budesonide 9 mg daily.  Will be tapering down to 6 mg daily next week. He is having 1-2 bowel movements daily on average.  No rectal bleeding  He was recently hospitalized with retroperitoneal bleed extending into the thigh, developed severe intractable pain in the leg.  Improved after holding Coumadin.  Developed new left lower extremity DVT and treated with Lovenox and restarted on low-dose Coumadin.  Lower extremity swelling has improved and he is doing better  Last office visit by Amy in July 2021, HPI below: He relocated from [place] to [place], and had been followed by Dr. Georgiann Mccoy in [place].  He was diagnosed with ulcerative colitis in 2016, had been treated with steroids initially and then budesonide which she has found helpful.  He was maintained on budesonide 3 mg/day.  He underwent follow-up colonoscopy in January 2021 with finding of Mayo score 1-2 pancolitis and also had several polyps removed.  Biopsies showed inflammatory polyps or serrated polyps stains were done for dysplasia which returned showing 3 serrated polyps and 2 inflamed serrated polyps no dysplasia but felt possibility of dysplasia significant.  Follow-up was recommended in 6 months.  Path from the remainder of the colon showed moderate colitis .  He was advised at that  time to start biologic therapy and initiated Entyvio in February 2021.  His last infusion was 03/26/2020. He says he had been relatively asymptomatic prior to starting the [facility] and has not noticed any real change in symptoms as he had not been having any abdominal pain diarrhea or bleeding.  He continues to feel well. Last labs on 03/19/2020 c-Met unremarkable and CBC in March 2021 within normal limits. Patient has history of recurrent DVT and PE and has been maintained on Coumadin for several years.  Also with history of hypertension, osteoarthritis, anxiety, history of bladder cancer, and has had 2 hip repairs. He is recently established with Dr. Quillian Quince [name] in [place] for primary care. Family history is positive for colon cancer in his brother.   Outpatient Encounter Medications as of 08/19/2020  Medication Sig  . acetaminophen (TYLENOL) 500 MG tablet Take 2 tablets (1,000 mg total) by mouth every 6 (six) hours as needed (for pain).  Marland Kitchen ascorbic acid (VITAMIN C) 500 MG tablet Take 500 mg by mouth daily.  . budesonide (ENTOCORT EC) 3 MG 24 hr capsule Take 9 mg by mouth in the morning and at bedtime. Dose will be tapered monthly: 9 mg twice daily for 30 days then 9 mg once Daily for 30 days, Then 6 mg once daily for 30 days, then continue 3 mg once daily  . Calcium Carbonate (CALCIUM 600 PO) Take 1 tablet by mouth daily.  . Cholecalciferol (VITAMIN D-3 PO) Take 1 tablet by mouth daily.  Marland Kitchen ezetimibe (ZETIA) 10 MG tablet Take 1 tablet by mouth daily.  Marland Kitchen  sertraline (ZOLOFT) 100 MG tablet Take 200 mg by mouth daily.  . simvastatin (ZOCOR) 40 MG tablet Take 1 tablet by mouth daily.  . Vedolizumab (ENTYVIO IV) Inject 1 each into the vein. Every 8 weeks  . warfarin (COUMADIN) 5 MG tablet Take 1 tablet (5 mg total) by mouth daily.  . [DISCONTINUED] enoxaparin (LOVENOX) 150 MG/ML injection Inject 1 mL (150 mg total) into the skin daily.  . [DISCONTINUED] lidocaine (LIDODERM) 5 % Place 1 patch  onto the skin daily. Remove & Discard patch within 12 hours or as directed by MD  . [DISCONTINUED] Omega-3 Fatty Acids (FISH OIL PO) Take 550 mg by mouth daily.  . [DISCONTINUED] oxyCODONE (ROXICODONE) 5 MG immediate release tablet Take 1 tablet (5 mg total) by mouth every 4 (four) hours as needed for severe pain or breakthrough pain.   No facility-administered encounter medications on file as of 08/19/2020.    Allergies as of 08/19/2020  . (No Known Allergies)    Past Medical History:  Diagnosis Date  . Adenomatous colon polyp   . Anxiety    chronic  . Arthritis   . Bladder cancer ([place]) 1991  . Clotting disorder ([place]) 2017   following hip surgery  . HLD (hyperlipidemia)   . Hypertension   . UC (ulcerative colitis) [facility])     Past Surgical History:  Procedure Laterality Date  . bladder cancer surgery  1991   managed by urologist  . COLONOSCOPY  2021   Hx of many colon's; last 6 months ago, todays is a follow up to the   . COLONOSCOPY W/ BIOPSIES AND POLYPECTOMY     Hx: of  . LUMBAR LAMINECTOMY/DECOMPRESSION MICRODISCECTOMY Right 04/08/2013   Procedure: LUMBAR FOUR-FIVE LUMBAR LAMINECTOMY/DECOMPRESSION MICRODISCECTOMY 1 LEVEL;  Surgeon: Charlie Pitter, MD;  Location: [facility];  Service: Neurosurgery;  Laterality: Right;  . TOTAL HIP ARTHROPLASTY Right   . TOTAL HIP ARTHROPLASTY Left     Family History  Problem Relation Age of Onset  . Hypertension Mother   . Heart disease Mother 68  . Hypertension Father   . Arthritis Father   . Stroke Father 79  . Colon cancer Brother 42  . Colon polyps Brother   . Heart disease Brother 32  . Esophageal cancer Neg Hx   . Rectal cancer Neg Hx   . Stomach cancer Neg Hx     Social History   Socioeconomic History  . Marital status: Married    Spouse name: Not on file  . Number of children: 2  . Years of education: Not on file  . Highest education level: Not on file  Occupational History  . Occupation: retired  Tobacco Use   . Smoking status: Former Smoker    Types: Cigarettes    Quit date: 10/03/1989    Years since quitting: 30.8  . Smokeless tobacco: Never Used  Vaping Use  . Vaping Use: Never used  Substance and Sexual Activity  . Alcohol use: Yes    Comment: occ  . Drug use: No  . Sexual activity: Yes    Partners: Male    Birth control/protection: None  Other Topics Concern  . Not on file  Social History Narrative   From [place], [place]   Social Determinants of Health   Financial Resource Strain:   . Difficulty of Paying Living Expenses: Not on file  Food Insecurity:   . Worried About Charity fundraiser in the Last Year: Not on file  . Ran  Out of Food in the Last Year: Not on file  Transportation Needs:   . Lack of Transportation (Medical): Not on file  . Lack of Transportation (Non-Medical): Not on file  Physical Activity:   . Days of Exercise per Week: Not on file  . Minutes of Exercise per Session: Not on file  Stress:   . Feeling of Stress : Not on file  Social Connections:   . Frequency of Communication with Friends and Family: Not on file  . Frequency of Social Gatherings with Friends and Family: Not on file  . Attends Religious Services: Not on file  . Active Member of Clubs or Organizations: Not on file  . Attends Archivist Meetings: Not on file  . Marital Status: Not on file  Intimate Partner Violence:   . Fear of Current or Ex-Partner: Not on file  . Emotionally Abused: Not on file  . Physically Abused: Not on file  . Sexually Abused: Not on file      Review of systems: All other review of systems negative except as mentioned in the HPI.   Physical Exam: Vitals:   08/19/20 0952  BP: 102/64  Pulse: 93  SpO2: 98%   Body mass index is 27.26 kg/m. Gen:      No acute distress HEENT:  sclera anicteric Abd:      soft, non-tender; no palpable masses, no distension Ext:    No edema Neuro: alert and oriented x 3 Psych: normal mood and affect  Data  Reviewed:  Reviewed labs, radiology imaging, old records and pertinent past GI work up   Assessment and Plan/Recommendations: 79 year old very pleasant gentleman with history of ulcerative pancolitis, initially diagnosed in 2016 currently in clinical remission though endoscopically had evidence of mild to moderate active inflammation  He is on maintenance therapy with Entyvio infusion every 8 weeks We will check Entyvio drug trough and antibody level to evaluate if he developed any resistance He is interested and coming off Entyvio infusions if it is not helping him  He was never tried on anti-TNF  He is on budesonide taper, currently at 9 mg daily Start mesalamine [Apriso] 1.5 g daily, unclear if he was on mesalamine in the past or not  Check CBC, CMP, sed rate, B12, folate, iron panel, TB QuantiFERON gold and fecal lactoferrin for monitoring disease activity and IBD health maintenance  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 The patient was provided an opportunity to ask questions and all were answered. The patient agreed with the plan and demonstrated an understanding of the instructions.  Damaris Hippo , MD    CC: Lilian Coma., MD

## 2020-08-19 NOTE — Patient Instructions (Signed)
Continue Budesonide taper  Start Mesalamine (Apriso) 1.5 mg daily, we will send this to your pharmacy today  Your provider has requested that you go to the basement level for lab work before leaving today. Press "B" on the elevator. The lab is located at the first door on the left as you exit the elevator.  You will come back on 09/14/2020 to pick up lab kit from the third floor, then go to the basement for labs that day before your Entyvio infusion   Due to recent changes in healthcare laws, you may see the results of your imaging and laboratory studies on MyChart before your provider has had a chance to review them.  We understand that in some cases there may be results that are confusing or concerning to you. Not all laboratory results come back in the same time frame and the provider may be waiting for multiple results in order to interpret others.  Please give [place] 48 hours in order for your provider to thoroughly review all the results before contacting the office for clarification of your results.   If you are age 77 or older, your body mass index should be between 23-30. Your Body mass index is 27.26 kg/m. If this is out of the aforementioned range listed, please consider follow up with your Primary Care Provider.  If you are age 77 or younger, your body mass index should be between 19-25. Your Body mass index is 27.26 kg/m. If this is out of the aformentioned range listed, please consider follow up with your Primary Care Provider.    I appreciate the  opportunity to care for you  Thank You   Harl Bowie , MD

## 2020-08-24 ENCOUNTER — Other Ambulatory Visit: Payer: Self-pay | Admitting: *Deleted

## 2020-08-24 DIAGNOSIS — K5100 Ulcerative (chronic) pancolitis without complications: Secondary | ICD-10-CM

## 2020-08-24 DIAGNOSIS — Z86711 Personal history of pulmonary embolism: Secondary | ICD-10-CM

## 2020-08-24 DIAGNOSIS — Z7901 Long term (current) use of anticoagulants: Secondary | ICD-10-CM

## 2020-08-24 DIAGNOSIS — R5383 Other fatigue: Secondary | ICD-10-CM

## 2020-08-24 DIAGNOSIS — D649 Anemia, unspecified: Secondary | ICD-10-CM

## 2020-09-14 ENCOUNTER — Other Ambulatory Visit (INDEPENDENT_AMBULATORY_CARE_PROVIDER_SITE_OTHER): Payer: Medicare Other

## 2020-09-14 ENCOUNTER — Ambulatory Visit (HOSPITAL_COMMUNITY)
Admission: RE | Admit: 2020-09-14 | Discharge: 2020-09-14 | Disposition: A | Discharge status: Home / Self Care | Payer: Medicare Other | Source: Ambulatory Visit | Attending: Gastroenterology | Admitting: Gastroenterology

## 2020-09-14 ENCOUNTER — Other Ambulatory Visit: Payer: Self-pay

## 2020-09-14 DIAGNOSIS — K5100 Ulcerative (chronic) pancolitis without complications: Secondary | ICD-10-CM | POA: Insufficient documentation

## 2020-09-14 LAB — CBC WITH DIFFERENTIAL/PLATELET
Basophils Absolute: 0 10*3/uL (ref 0.0–0.1)
Basophils Relative: 0.8 % (ref 0.0–3.0)
Eosinophils Absolute: 0.2 10*3/uL (ref 0.0–0.7)
Eosinophils Relative: 3.9 % (ref 0.0–5.0)
HCT: 47.6 % (ref 39.0–52.0)
Hemoglobin: 15.9 g/dL (ref 13.0–17.0)
Lymphocytes Relative: 18.8 % (ref 12.0–46.0)
Lymphs Abs: 1.2 10*3/uL (ref 0.7–4.0)
MCHC: 33.5 g/dL (ref 30.0–36.0)
MCV: 91.6 fl (ref 78.0–100.0)
Monocytes Absolute: 0.4 10*3/uL (ref 0.1–1.0)
Monocytes Relative: 6.2 % (ref 3.0–12.0)
Neutro Abs: 4.4 10*3/uL (ref 1.4–7.7)
Neutrophils Relative %: 70.3 % (ref 43.0–77.0)
Platelets: 207 10*3/uL (ref 150.0–400.0)
RBC: 5.2 Mil/uL (ref 4.22–5.81)
RDW: 14.1 % (ref 11.5–15.5)
WBC: 6.2 10*3/uL (ref 4.0–10.5)

## 2020-09-14 LAB — IBC + FERRITIN
Ferritin: 29.1 ng/mL (ref 22.0–322.0)
Iron: 116 ug/dL (ref 42–165)
Saturation Ratios: 33.5 % (ref 20.0–50.0)
Transferrin: 247 mg/dL (ref 212.0–360.0)

## 2020-09-14 LAB — COMPREHENSIVE METABOLIC PANEL
ALT: 19 U/L (ref 0–53)
AST: 14 U/L (ref 0–37)
Albumin: 4.4 g/dL (ref 3.5–5.2)
Alkaline Phosphatase: 63 U/L (ref 39–117)
BUN: 19 mg/dL (ref 6–23)
CO2: 29 mEq/L (ref 19–32)
Calcium: 9.2 mg/dL (ref 8.4–10.5)
Chloride: 103 mEq/L (ref 96–112)
Creatinine, Ser: 1.01 mg/dL (ref 0.40–1.50)
GFR: 71.65 mL/min (ref >60.00)
Glucose, Bld: 89 mg/dL (ref 70–99)
Potassium: 3.6 mEq/L (ref 3.5–5.1)
Sodium: 143 mEq/L (ref 135–145)
Total Bilirubin: 0.5 mg/dL (ref 0.2–1.2)
Total Protein: 6.9 g/dL (ref 6.0–8.3)

## 2020-09-14 LAB — SEDIMENTATION RATE: Sed Rate: 5 mm/hr (ref 0–20)

## 2020-09-14 MED ORDER — VEDOLIZUMAB 300 MG IV SOLR
300.0000 mg | INTRAVENOUS | Status: DC
  Administered 2020-09-14: 300 mg via INTRAVENOUS
  Filled 2020-09-14: qty 5

## 2020-09-16 LAB — QUANTIFERON-TB GOLD PLUS
Mitogen-NIL: >10 IU/mL
NIL: 0.03 IU/mL
QuantiFERON-TB Gold Plus: NEGATIVE
TB1-NIL: 0.05 IU/mL
TB2-NIL: 0.05 IU/mL

## 2020-09-16 LAB — FECAL LACTOFERRIN, QUANT
Fecal Lactoferrin: POSITIVE — AB
MICRO NUMBER:: 11309782
SPECIMEN QUALITY:: ADEQUATE

## 2020-09-23 ENCOUNTER — Telehealth: Payer: Self-pay | Admitting: Hematology and Oncology

## 2020-09-23 NOTE — Telephone Encounter (Signed)
Rescheduled appointment due to provider PAL. Patient's wife is aware of changes.

## 2020-09-25 LAB — VEDOLIZUMAB AND ANTI-VEDO AB
Anti-Vedolizumab Antibody: <25 ng/mL
Vedolizumab: 15 ug/mL

## 2020-10-09 ENCOUNTER — Ambulatory Visit (INDEPENDENT_AMBULATORY_CARE_PROVIDER_SITE_OTHER): Payer: Medicare Other | Admitting: Gastroenterology

## 2020-10-09 ENCOUNTER — Encounter: Payer: Self-pay | Admitting: Gastroenterology

## 2020-10-09 VITALS — BP 140/70 | HR 83 | Ht 73.0 in | Wt 208.0 lb

## 2020-10-09 DIAGNOSIS — K5100 Ulcerative (chronic) pancolitis without complications: Secondary | ICD-10-CM | POA: Diagnosis not present

## 2020-10-09 MED ORDER — BUDESONIDE 3 MG PO CPEP
3.0000 mg | ORAL_CAPSULE | Freq: Every day | ORAL | 1 refills | Status: DC
Start: 2020-10-09 — End: 2022-01-14

## 2020-10-09 NOTE — Progress Notes (Addendum)
Paul Luna    [id number]    1941-05-02  Primary Care Physician:[name], [name], MD  Referring Physician: Lilian Coma., MD No address on file   Chief complaint: Ulcerative colitis  HPI:  80 year old very pleasant gentleman here for follow-up visit for ulcerative pancolitis He is doing overall well.  His symptoms are stable.  He is currently taking budesonide 3 mg daily  Denies any nausea, vomiting, abdominal pain, melena or bright red blood per rectum  He has significant out-of-pocket expense with Entyvio, is requesting switching to alternate medication. Last dose of Entyvio was in end of December  Colonoscopy June 11, 2020: Showed pancolitis, biopsies showed mildly active ulcerative colitis in the right colon and moderately active ulcerative colitis in the left colon.  Sigmoid diverticulosis and internal hemorrhoids   He was  hospitalized with retroperitoneal bleed extending into the thigh, developed severe intractable pain in the leg.  Improved after holding Coumadin.  Developed new left lower extremity DVT and treated with Lovenox and restarted on low-dose Coumadin.  Lower extremity swelling has improved and he is doing better  Last office visit by Amy in July 2021, HPI below: He relocated from [place] to [place], and had been followed by Dr. Marvis Moeller. He was diagnosed with ulcerative colitis in 2016, had been treated with steroids initially and then budesonide which she has found helpful. He was maintained on budesonide 3 mg/day. He underwent follow-up colonoscopy in January 2021 with finding of Mayo score 1-2 pancolitis and also had several polyps removed. Biopsies showed inflammatory polyps or serrated polyps stains were done for dysplasia which returned showing 3 serrated polyps and 2 inflamed serrated polyps no dysplasia but felt possibility of dysplasia significant. Follow-up was recommended in 6 months. Path from the remainder of  the colon showed moderate colitis .He was advised at that time to start biologic therapy and initiated Entyvio in February 2021. His last infusion was 03/26/2020. He says he had been relatively asymptomatic prior to starting the [facility] and has not noticed any real change in symptoms as he had not been having any abdominal pain diarrhea or bleeding. He continues to feel well. Last labs on 03/19/2020 c-Met unremarkable and CBC in March 2021 within normal limits. Patient has history of recurrent DVT and PE and has been maintained on Coumadin for several years. Also with history of hypertension, osteoarthritis, anxiety, history of bladder cancer, and has had 2 hip repairs. He is recently established with Dr. Quillian Quince [name] in [place] for primary care. Family history is positive for colon cancer in his brother.   Outpatient Encounter Medications as of 10/09/2020  Medication Sig  . acetaminophen (TYLENOL) 500 MG tablet Take 2 tablets (1,000 mg total) by mouth every 6 (six) hours as needed (for pain).  Marland Kitchen ascorbic acid (VITAMIN C) 500 MG tablet Take 500 mg by mouth daily.  . budesonide (ENTOCORT EC) 3 MG 24 hr capsule Take 9 mg by mouth in the morning and at bedtime. Dose will be tapered monthly: 9 mg twice daily for 30 days then 9 mg once Daily for 30 days, Then 6 mg once daily for 30 days, then continue 3 mg once daily  . Calcium Carbonate (CALCIUM 600 PO) Take 1 tablet by mouth daily.  . Cholecalciferol (VITAMIN D-3 PO) Take 1 tablet by mouth daily.  Marland Kitchen ezetimibe (ZETIA) 10 MG tablet Take 1 tablet by mouth daily.  . sertraline (ZOLOFT) 100 MG tablet Take 200  mg by mouth daily.  . simvastatin (ZOCOR) 40 MG tablet Take 1 tablet by mouth daily.  . Vedolizumab (ENTYVIO IV) Inject 1 each into the vein. Every 8 weeks  . warfarin (COUMADIN) 5 MG tablet Take 1 tablet (5 mg total) by mouth daily.   No facility-administered encounter medications on file as of 10/09/2020.    Allergies as of 10/09/2020   . (No Known Allergies)    Past Medical History:  Diagnosis Date  . Adenomatous colon polyp   . Anxiety    chronic  . Arthritis   . Bladder cancer ([place]) 1991  . Clotting disorder ([place]) 2017   following hip surgery  . HLD (hyperlipidemia)   . Hypertension   . UC (ulcerative colitis) [facility])     Past Surgical History:  Procedure Laterality Date  . bladder cancer surgery  1991   managed by urologist  . COLONOSCOPY  2021   Hx of many colon's; last 6 months ago, todays is a follow up to the   . COLONOSCOPY W/ BIOPSIES AND POLYPECTOMY     Hx: of  . LUMBAR LAMINECTOMY/DECOMPRESSION MICRODISCECTOMY Right 04/08/2013   Procedure: LUMBAR FOUR-FIVE LUMBAR LAMINECTOMY/DECOMPRESSION MICRODISCECTOMY 1 LEVEL;  Surgeon: Charlie Pitter, MD;  Location: [place] [facility];  Service: Neurosurgery;  Laterality: Right;  . TOTAL HIP ARTHROPLASTY Right   . TOTAL HIP ARTHROPLASTY Left     Family History  Problem Relation Age of Onset  . Hypertension Mother   . Heart disease Mother 88  . Hypertension Father   . Arthritis Father   . Stroke Father 13  . Colon cancer Brother 50  . Colon polyps Brother   . Heart disease Brother 68  . Esophageal cancer Neg Hx   . Rectal cancer Neg Hx   . Stomach cancer Neg Hx     Social History   Socioeconomic History  . Marital status: Married    Spouse name: Not on file  . Number of children: 2  . Years of education: Not on file  . Highest education level: Not on file  Occupational History  . Occupation: retired  Tobacco Use  . Smoking status: Former Smoker    Types: Cigarettes    Quit date: 10/03/1989    Years since quitting: 31.0  . Smokeless tobacco: Never Used  Vaping Use  . Vaping Use: Never used  Substance and Sexual Activity  . Alcohol use: Yes    Comment: occ  . Drug use: No  . Sexual activity: Yes    Partners: Male    Birth control/protection: None  Other Topics Concern  . Not on file  Social History Narrative   From [place], [place]    Social Determinants of Health   Financial Resource Strain: Not on file  Food Insecurity: Not on file  Transportation Needs: Not on file  Physical Activity: Not on file  Stress: Not on file  Social Connections: Not on file  Intimate Partner Violence: Not on file      Review of systems: All other review of systems negative except as mentioned in the HPI.   Physical Exam: Vitals:   10/09/20 1131  BP: 140/70  Pulse: 83  SpO2: 98%   Body mass index is 27.44 kg/m. Gen:      No acute distress Neuro: alert and oriented x 3 Psych: normal mood and affect  Data Reviewed:  Reviewed labs, radiology imaging, old records and pertinent past GI work up   Assessment and Plan/Recommendations:  80 year old very  pleasant gentleman with history of ulcerative colitis, diagnosed in 2016 pancolitis Currently on budesonide taper, is down to 3 mg daily and Entyvio infusion every 8 weeks.  Though Entyvio drug trough is within therapeutic range with undetectable antibody level, it is unclear if Weyman Rodney is helping given he had moderate to mild active inflammation on recent colonoscopy while he was on maintenance dose of Entyvio He has high out-of-pocket expense with Entyvio infusions, will plan to switch to Humira once we obtain insurance approval. He was never treated with anti-TNF therapy prior to initiation with Entyvio  Discussed potential adverse effects with anti-TNF therapy, overall benefits and risks.  Continue budesonide 3 mg daily for additional 2 to 3 months, will plan to overlap until after he finishes induction therapy with Humira and is on stable maintenance dose  IBD health maintenance: Up-to-date TB QuantiFERON Negative December 2021 He is vaccinated for Covid and Influenza  Return in 6 to 8 weeks or sooner if needed  This visit required 40 minutes of patient care (this includes precharting, chart review, review of results, face-to-face time used for counseling as well as  treatment plan and follow-up. The patient was provided an opportunity to ask questions and all were answered. The patient agreed with the plan and demonstrated an understanding of the instructions.  Damaris Hippo , MD    CC: Lilian Coma., MD

## 2020-10-09 NOTE — Patient Instructions (Addendum)
We will send Budesonide refills to your pharmacy  We will contact you about your Humira   Due to recent changes in healthcare laws, you may see the results of your imaging and laboratory studies on MyChart before your provider has had a chance to review them.  We understand that in some cases there may be results that are confusing or concerning to you. Not all laboratory results come back in the same time frame and the provider may be waiting for multiple results in order to interpret others.  Please give [place] 48 hours in order for your provider to thoroughly review all the results before contacting the office for clarification of your results.   Follow up in 8 weeks  I appreciate the  opportunity to care for you  Thank You   Harl Bowie , MD

## 2020-10-15 ENCOUNTER — Telehealth: Payer: Self-pay

## 2020-10-15 NOTE — Telephone Encounter (Signed)
Patient now has [place] for part D insurance. ID# [id number] BIN [id number] PCN [id number] Rx [employer] [id number]  PA for Humira starter kit started through Cover My Meds

## 2020-10-20 NOTE — Telephone Encounter (Signed)
Humira approved. Out of pocket costs are very high. Patient has concerns about this. Information to  Dr Silverio Decamp.

## 2020-10-21 ENCOUNTER — Other Ambulatory Visit: Payer: Self-pay

## 2020-10-21 DIAGNOSIS — K5100 Ulcerative (chronic) pancolitis without complications: Secondary | ICD-10-CM

## 2020-11-06 ENCOUNTER — Ambulatory Visit (HOSPITAL_COMMUNITY)
Admission: RE | Admit: 2020-11-06 | Discharge: 2020-11-06 | Disposition: A | Discharge status: Home / Self Care | Payer: Medicare Other | Source: Ambulatory Visit | Attending: Hematology and Oncology | Admitting: Hematology and Oncology

## 2020-11-06 ENCOUNTER — Telehealth: Payer: Self-pay | Admitting: *Deleted

## 2020-11-06 ENCOUNTER — Other Ambulatory Visit: Payer: Self-pay

## 2020-11-06 DIAGNOSIS — I825Z9 Chronic embolism and thrombosis of unspecified deep veins of unspecified distal lower extremity: Secondary | ICD-10-CM | POA: Diagnosis present

## 2020-11-06 DIAGNOSIS — Z86718 Personal history of other venous thrombosis and embolism: Secondary | ICD-10-CM

## 2020-11-06 NOTE — Progress Notes (Signed)
Left lower extremity venous duplex has been completed. Preliminary results can be found in CV Proc through chart review.  Results were given to [facility] at Dr. Geralyn Flash office.  11/06/20 10:08 AM Paul Luna RVT

## 2020-11-06 NOTE — Telephone Encounter (Signed)
This RN was notified by vascular lab- Marya Amsler of new partial occulusion behind left knee.   Above reading printed and reviewed with NP covering Dr Lindi Adie- note pt's coumadin and INR is managed by his primary MD Dr Rollene Rotunda.  This RN contacted his office post and informed Lilia Pro of findings- noted pt's INR was checked 10/27/2020 with reading of 2.2.  Reading faxed with confirmation.  Pt is scheduled to see Dr Lindi Adie on 11/11/2020.

## 2020-11-09 ENCOUNTER — Ambulatory Visit: Payer: Medicare Other | Admitting: Hematology and Oncology

## 2020-11-10 ENCOUNTER — Telehealth: Payer: Self-pay | Admitting: Hematology and Oncology

## 2020-11-10 ENCOUNTER — Ambulatory Visit (HOSPITAL_COMMUNITY)
Admission: RE | Admit: 2020-11-10 | Discharge: 2020-11-10 | Disposition: A | Discharge status: Home / Self Care | Payer: Medicare Other | Source: Ambulatory Visit | Attending: Gastroenterology | Admitting: Gastroenterology

## 2020-11-10 ENCOUNTER — Other Ambulatory Visit: Payer: Self-pay

## 2020-11-10 DIAGNOSIS — K5100 Ulcerative (chronic) pancolitis without complications: Secondary | ICD-10-CM

## 2020-11-10 MED ORDER — VEDOLIZUMAB 300 MG IV SOLR
300.0000 mg | INTRAVENOUS | Status: DC
  Administered 2020-11-10: 300 mg via INTRAVENOUS
  Filled 2020-11-10: qty 5

## 2020-11-10 NOTE — Telephone Encounter (Signed)
Rescheduled per 2/9 per provider. Called and spoke with pt wife, confirmed 2/17 appt

## 2020-11-11 ENCOUNTER — Inpatient Hospital Stay: Payer: Medicare Other | Admitting: Hematology and Oncology

## 2020-11-17 ENCOUNTER — Encounter: Payer: Self-pay | Admitting: Gastroenterology

## 2020-11-17 ENCOUNTER — Ambulatory Visit (INDEPENDENT_AMBULATORY_CARE_PROVIDER_SITE_OTHER): Payer: Medicare Other | Admitting: Gastroenterology

## 2020-11-17 VITALS — BP 120/80 | HR 70 | Ht 73.0 in | Wt 211.0 lb

## 2020-11-17 DIAGNOSIS — K5100 Ulcerative (chronic) pancolitis without complications: Secondary | ICD-10-CM | POA: Diagnosis not present

## 2020-11-17 NOTE — Progress Notes (Signed)
Paul Luna    [id number]    Nov 13, 1940  Primary Care Physician:[name], [name], MD  Referring Physician: Lilian Coma., MD No address on file   Chief complaint:  Ulcerative pan colitis   HPI:  80 year old very pleasant gentleman here for follow-up visit for ulcerative pancolitis He is doing overall well. His symptoms are stable.  He is currently taking budesonide 3 mg daily  Denies any nausea, vomiting, abdominal pain, melena or bright red blood per rectum  He has acute DVT on left lower extremity.  Currently on warfarin  Colonoscopy June 11, 2020: Showed pancolitis, biopsies showed mildly active ulcerative colitisin the right colon and moderately active ulcerative colitis in the left colon. Sigmoid diverticulosis and internal hemorrhoids  He has high out-of-pocket expense with Entyvio infusion, try to switch to Humira which is currently not covered under his prescription benefits and actually turned out to be more expensive.  Plan to continue Entyvio infusions for now.  He was  hospitalized in October 2021 with retroperitoneal bleed extending into the thigh, developed severe intractable pain in the leg. Improved after holding Coumadin. Developed new left lower extremity DVT and treated with Lovenox and restarted on low-dose Coumadin. Lower extremity swelling has improved and he is doing better  Last office visit by Amy in July 2021,HPI below: [place], and had been followed by Dr. Marvis Moeller. He was diagnosed with ulcerative colitis in 2016, had been treated with steroids initially and then budesonide which she has found helpful. Hewasmaintained on budesonide 3 mg/day. He underwent follow-up colonoscopy in January 2021 with finding of Mayo score 1-2 pancolitis and also had several polyps removed. Biopsies showed inflammatory polyps or serrated polyps stains were done for dysplasia which returned showing 3  serrated polyps and 2 inflamed serrated polyps no dysplasia but felt possibility of dysplasia significant. Follow-up was recommended in 6 months. Path from the remainder of the colon showed moderate colitis .He was advised at that time to start biologic therapy and initiated Entyvio in February 2021. His last infusion was 03/26/2020. He says he had been relatively asymptomatic prior to starting the [facility] and has not noticed any real change in symptoms as he had not been having any abdominal pain diarrhea or bleeding. He continues to feel well. Last labs on 03/19/2020 c-Met unremarkable and CBC in March 2021 within normal limits. Patient has history of recurrent DVT and PE and has been maintained on Coumadin for several years. Also with history of hypertension, osteoarthritis, anxiety, history of bladder cancer, and has had 2 hip repairs. He is recently established with Dr. Quillian Quince [name] in [place] for primary care. Family history is positive for colon cancer in his brother.   Outpatient Encounter Medications as of 11/17/2020  Medication Sig  . acetaminophen (TYLENOL) 500 MG tablet Take 2 tablets (1,000 mg total) by mouth every 6 (six) hours as needed (for pain).  Marland Kitchen ascorbic acid (VITAMIN C) 500 MG tablet Take 500 mg by mouth daily.  . budesonide (ENTOCORT EC) 3 MG 24 hr capsule Take 1 capsule (3 mg total) by mouth daily.  . Calcium Carbonate (CALCIUM 600 PO) Take 1 tablet by mouth daily.  . Cholecalciferol (VITAMIN D-3 PO) Take 1 tablet by mouth daily.  Marland Kitchen ezetimibe (ZETIA) 10 MG tablet Take 1 tablet by mouth daily.  . sertraline (ZOLOFT) 100 MG tablet Take 200 mg by mouth daily.  . simvastatin (ZOCOR) 40 MG tablet Take  1 tablet by mouth daily.  . Vedolizumab (ENTYVIO IV) Inject 1 each into the vein. Every 8 weeks  . warfarin (COUMADIN) 5 MG tablet Take 1 tablet (5 mg total) by mouth daily.   No facility-administered encounter medications on file as of 11/17/2020.    Allergies as  of 11/17/2020  . (No Known Allergies)    Past Medical History:  Diagnosis Date  . Adenomatous colon polyp   . Anxiety    chronic  . Arthritis   . Bladder cancer ([place]) 1991  . Clotting disorder ([place]) 2017   following hip surgery  . HLD (hyperlipidemia)   . Hypertension   . UC (ulcerative colitis) [facility])     Past Surgical History:  Procedure Laterality Date  . bladder cancer surgery  1991   managed by urologist  . COLONOSCOPY  2021   Hx of many colon's; last 6 months ago, todays is a follow up to the   . COLONOSCOPY W/ BIOPSIES AND POLYPECTOMY     Hx: of  . LUMBAR LAMINECTOMY/DECOMPRESSION MICRODISCECTOMY Right 04/08/2013   Procedure: LUMBAR FOUR-FIVE LUMBAR LAMINECTOMY/DECOMPRESSION MICRODISCECTOMY 1 LEVEL;  Surgeon: Charlie Pitter, MD;  Location: [place] [facility];  Service: Neurosurgery;  Laterality: Right;  . TOTAL HIP ARTHROPLASTY Right   . TOTAL HIP ARTHROPLASTY Left     Family History  Problem Relation Age of Onset  . Hypertension Mother   . Heart disease Mother 68  . Hypertension Father   . Arthritis Father   . Stroke Father 61  . Colon cancer Brother 39  . Colon polyps Brother   . Heart disease Brother 63  . Esophageal cancer Neg Hx   . Rectal cancer Neg Hx   . Stomach cancer Neg Hx     Social History   Socioeconomic History  . Marital status: Married    Spouse name: Not on file  . Number of children: 2  . Years of education: Not on file  . Highest education level: Not on file  Occupational History  . Occupation: retired  Tobacco Use  . Smoking status: Former Smoker    Types: Cigarettes    Quit date: 10/03/1989    Years since quitting: 31.1  . Smokeless tobacco: Never Used  Vaping Use  . Vaping Use: Never used  Substance and Sexual Activity  . Alcohol use: Yes    Comment: occ  . Drug use: No  . Sexual activity: Yes    Partners: Male    Birth control/protection: None  Other Topics Concern  . Not on file  Social History Narrative   From [place],  [place]   Social Determinants of Health   Financial Resource Strain: Not on file  Food Insecurity: Not on file  Transportation Needs: Not on file  Physical Activity: Not on file  Stress: Not on file  Social Connections: Not on file  Intimate Partner Violence: Not on file      Review of systems: All other review of systems negative except as mentioned in the HPI.   Physical Exam: Vitals:   11/17/20 1053  BP: 120/80  Pulse: 70   Body mass index is 27.84 kg/m. Gen:      No acute distress HEENT:  sclera anicteric Ext: Bilateral edema L>R Neuro: alert and oriented x 3 Psych: normal mood and affect  Data Reviewed:  Reviewed labs, radiology imaging, old records and pertinent past GI work up   Assessment and Plan/Recommendations:  80 year old very pleasant gentleman with history of ulcerative pan  colitis initially diagnosed in 2016.  In clinical remission on Entyvio infusion every 8 weeks, drug trough within therapeutic range with no detectable antibody Budesonide 3 mg taper dose, he has prescription left to last few more weeks.  Advised patient to complete the current prescription and not to refill it  IBD health maintenance: Up-to-date  Return in 6 months or sooner if needed  This visit required 30 minutes of patient care (this includes precharting, chart review, review of results, face-to-face time used for counseling as well as treatment plan and follow-up. The patient was provided an opportunity to ask questions and all were answered. The patient agreed with the plan and demonstrated an understanding of the instructions.  Damaris Hippo , MD    CC: Lilian Coma., MD

## 2020-11-17 NOTE — Patient Instructions (Signed)
Follow up in 6 months  Due to recent changes in healthcare laws, you may see the results of your imaging and laboratory studies on MyChart before your provider has had a chance to review them.  We understand that in some cases there may be results that are confusing or concerning to you. Not all laboratory results come back in the same time frame and the provider may be waiting for multiple results in order to interpret others.  Please give [place] 48 hours in order for your provider to thoroughly review all the results before contacting the office for clarification of your results.   I appreciate the  opportunity to care for you  Thank You   Harl Bowie , MD

## 2020-11-18 NOTE — Progress Notes (Signed)
Patient Care Team: Lilian Coma., MD as PCP - General (Internal Medicine) Calvert Cantor, MD as Consulting Physician (Ophthalmology) Earnie Larsson, MD as Consulting Physician (Neurosurgery) Everardo Pacific, MD (Urology) Laurence Spates, MD (Inactive) as Consulting Physician (Gastroenterology)  DIAGNOSIS:    ICD-10-CM   1. Other pulmonary embolism without acute cor pulmonale, unspecified chronicity (HCC)  I26.99   2. Retroperitoneal hematoma  K66.1   3. 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 with above-mentioned history of DVT and PE, and retroperitoneal hematoma, currently on Coumadin. Left lower extremity US on 11/06/20 showed an acute left DVT. He presents to the clinic today for follow-up.  He reports that his left leg swelling is better with the elastic compression socks.  He does not have any pain underneath the knee.  ALLERGIES:  has No Known Allergies.  MEDICATIONS:  Current Outpatient Medications  Medication Sig Dispense Refill  . acetaminophen (TYLENOL) 500 MG tablet Take 2 tablets (1,000 mg total) by mouth every 6 (six) hours as needed (for pain). 100 tablet 2  . ascorbic acid (VITAMIN C) 500 MG tablet Take 500 mg by mouth daily.    . budesonide (ENTOCORT EC) 3 MG 24 hr capsule Take 1 capsule (3 mg total) by mouth daily. 90 capsule 1  . Calcium Carbonate (CALCIUM 600 PO) Take 1 tablet by mouth daily.    . Cholecalciferol (VITAMIN D-3 PO) Take 1 tablet by mouth daily.    Marland Kitchen ezetimibe (ZETIA) 10 MG tablet Take 1 tablet by mouth daily.    . sertraline (ZOLOFT) 100 MG tablet Take 200 mg by mouth daily.    . simvastatin (ZOCOR) 40 MG tablet Take 1 tablet by mouth daily.    . Vedolizumab (ENTYVIO IV) Inject 1 each into the vein. Every 8 weeks    . warfarin (COUMADIN) 5 MG tablet Take 1 tablet (5 mg total) by mouth daily.  90 tablet 3   No current facility-administered medications for this visit.    PHYSICAL EXAMINATION: ECOG PERFORMANCE STATUS: 1 - Symptomatic but completely ambulatory  Vitals:   11/19/20 0939  BP: (!) 156/84  Pulse: 79  Resp: 17  Temp: 97.7 F (36.5 C)  SpO2: 97%   Filed Weights   11/19/20 0939  Weight: 213 lb 12.8 oz (97 kg)    LABORATORY DATA:  I have reviewed the data as listed CMP Latest Ref Rng & Units 09/14/2020 07/07/2020 07/06/2020  Glucose 70 - 99 mg/dL 89 130(H) 131(H)  BUN 6 - 23 mg/dL 19 28(H) 19  Creatinine 0.40 - 1.50 mg/dL 1.01 0.96 0.80  Sodium 135 - 145 mEq/L 143 139 141  Potassium 3.5 - 5.1 mEq/L 3.6 4.1 3.7  Chloride 96 - 112 mEq/L 103 102 105  CO2 19 - 32 mEq/L 29 26 26   Calcium 8.4 - 10.5 mg/dL 9.2 8.4(L) 8.1(L)  Total Protein 6.0 - 8.3 g/dL 6.9 - -  Total Bilirubin 0.2 - 1.2 mg/dL 0.5 - -  Alkaline Phos 39 - 117 U/L 63 - -  AST 0 - 37 U/L 14 - -  ALT 0 - 53 U/L 19 - -    Lab Results  Component Value Date   WBC 6.2 09/14/2020   HGB 15.9 09/14/2020   HCT 47.6 09/14/2020   MCV 91.6 09/14/2020   PLT 207.0 09/14/2020   NEUTROABS  4.4 09/14/2020    ASSESSMENT & PLAN:  DVT (deep venous thrombosis) History of DVT on Coumadin previously Hospitalization October 2021: Retroperitoneal bleed extending into the thigh Severe intractable pain in the leg: Improved with muscle relaxants and discontinuation of Coumadin. Lupus anticoagulant: Negative  08/06/2020: New left leg DVT left posterior tibial, left peroneal veins: Resumed Coumadin 11/06/2020: Consistent with acute DVT involving left popliteal vein  We discussed different options including switching him to Xarelto or Eliquis.  However because of very high cost he is not keen on switching to those drugs.  The new plan is to try to adjust his INR to be between 2.5 and 3.  We will recheck with another ultrasound in 3 months and follow-up after that with a MyChart virtual visit.    No orders of the  defined types were placed in this encounter.  The patient has a good understanding of the overall plan. he agrees with it. he will call with any problems that may develop before the next visit here.  Total time spent: 20 mins including face to face time and time spent for planning, charting and coordination of care  Rulon Eisenmenger, MD, MPH 11/19/2020  I, Cloyde Reams Dorshimer, am acting as scribe for Dr. Nicholas Lose.  I have reviewed the above documentation for accuracy and completeness, and I agree with the above.

## 2020-11-19 ENCOUNTER — Inpatient Hospital Stay: Payer: Medicare Other | Attending: Hematology and Oncology | Admitting: Hematology and Oncology

## 2020-11-19 ENCOUNTER — Other Ambulatory Visit: Payer: Self-pay

## 2020-11-19 VITALS — BP 156/84 | HR 79 | Temp 97.7°F | Resp 17 | Ht 73.0 in | Wt 213.8 lb

## 2020-11-19 DIAGNOSIS — K661 Hemoperitoneum: Secondary | ICD-10-CM

## 2020-11-19 DIAGNOSIS — I825Z9 Chronic embolism and thrombosis of unspecified deep veins of unspecified distal lower extremity: Secondary | ICD-10-CM | POA: Diagnosis not present

## 2020-11-19 DIAGNOSIS — Z7901 Long term (current) use of anticoagulants: Secondary | ICD-10-CM | POA: Insufficient documentation

## 2020-11-19 DIAGNOSIS — I2699 Other pulmonary embolism without acute cor pulmonale: Secondary | ICD-10-CM

## 2020-11-19 DIAGNOSIS — Z7952 Long term (current) use of systemic steroids: Secondary | ICD-10-CM | POA: Insufficient documentation

## 2020-11-19 DIAGNOSIS — Z86711 Personal history of pulmonary embolism: Secondary | ICD-10-CM | POA: Insufficient documentation

## 2020-11-19 DIAGNOSIS — Z79899 Other long term (current) drug therapy: Secondary | ICD-10-CM | POA: Diagnosis not present

## 2020-11-19 DIAGNOSIS — Z86718 Personal history of other venous thrombosis and embolism: Secondary | ICD-10-CM | POA: Insufficient documentation

## 2020-11-19 NOTE — Assessment & Plan Note (Addendum)
History of DVT on Coumadin previously Hospitalization October 2021: Retroperitoneal bleed extending into the thigh Severe intractable pain in the leg: Improved with muscle relaxants and discontinuation of Coumadin. Lupus anticoagulant: Negative  08/06/2020: New left leg DVT left posterior tibial, left peroneal veins: Resumed Coumadin 11/06/2020: Consistent with acute DVT involving left popliteal vein  We discussed different options including switching him to Xarelto or Eliquis.  However because of very high cost he is not keen on switching to those drugs.  The new plan is to try to adjust his INR to be between 2.5 and 3.  We will recheck with another ultrasound in 3 months and follow-up after that with a MyChart virtual visit.

## 2021-01-05 ENCOUNTER — Ambulatory Visit (HOSPITAL_COMMUNITY)
Admission: RE | Admit: 2021-01-05 | Discharge: 2021-01-05 | Disposition: A | Discharge status: Home / Self Care | Payer: Medicare Other | Source: Ambulatory Visit | Attending: Gastroenterology | Admitting: Gastroenterology

## 2021-01-05 ENCOUNTER — Other Ambulatory Visit: Payer: Self-pay

## 2021-01-05 DIAGNOSIS — K5100 Ulcerative (chronic) pancolitis without complications: Secondary | ICD-10-CM

## 2021-01-05 MED ORDER — VEDOLIZUMAB 300 MG IV SOLR
300.0000 mg | INTRAVENOUS | Status: DC
  Administered 2021-01-05: 300 mg via INTRAVENOUS
  Filled 2021-01-05: qty 5

## 2021-02-04 ENCOUNTER — Other Ambulatory Visit: Payer: Self-pay

## 2021-02-04 ENCOUNTER — Ambulatory Visit (HOSPITAL_COMMUNITY)
Admission: RE | Admit: 2021-02-04 | Discharge: 2021-02-04 | Disposition: A | Discharge status: Home / Self Care | Payer: Medicare Other | Source: Ambulatory Visit | Attending: Hematology and Oncology | Admitting: Hematology and Oncology

## 2021-02-04 ENCOUNTER — Telehealth: Payer: Self-pay | Admitting: *Deleted

## 2021-02-04 DIAGNOSIS — I825Z9 Chronic embolism and thrombosis of unspecified deep veins of unspecified distal lower extremity: Secondary | ICD-10-CM | POA: Diagnosis present

## 2021-02-04 DIAGNOSIS — I825Z2 Chronic embolism and thrombosis of unspecified deep veins of left distal lower extremity: Secondary | ICD-10-CM | POA: Diagnosis not present

## 2021-02-04 NOTE — Telephone Encounter (Signed)
Vascular lab called to say patient is positive for DVT in left leg. Has now been classified as "Chronic".  Pt to continue on Coumadin.

## 2021-02-04 NOTE — Progress Notes (Signed)
Left lower extremity venous duplex has been completed. Preliminary results can be found in CV Proc through chart review.  Results were given to Tammy at Dr. Geralyn Flash office.  02/04/21 1:03 PM Paul Luna RVT

## 2021-02-17 NOTE — Assessment & Plan Note (Signed)
History of DVT on Coumadin previously Hospitalization October 2021: Retroperitoneal bleed extending into the thigh Severe intractable pain in the leg: Improved with muscle relaxants and discontinuation of Coumadin. 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

## 2021-02-18 ENCOUNTER — Inpatient Hospital Stay: Payer: Medicare Other | Attending: Hematology and Oncology | Admitting: Hematology and Oncology

## 2021-02-18 ENCOUNTER — Encounter: Payer: Self-pay | Admitting: Hematology and Oncology

## 2021-02-18 DIAGNOSIS — I825Z9 Chronic embolism and thrombosis of unspecified deep veins of unspecified distal lower extremity: Secondary | ICD-10-CM

## 2021-02-18 DIAGNOSIS — Z86711 Personal history of pulmonary embolism: Secondary | ICD-10-CM | POA: Diagnosis not present

## 2021-02-18 MED ORDER — RIVAROXABAN (XARELTO) VTE STARTER PACK (15 & 20 MG): ORAL_TABLET | ORAL | 0 refills | Status: DC

## 2021-02-18 MED ORDER — PREDNISONE 5 MG PO TABS: 15.0000 mg | ORAL_TABLET | Freq: Every day | ORAL | Status: DC

## 2021-02-18 NOTE — Progress Notes (Signed)
  HEMATOLOGY-ONCOLOGY TELEPHONE VISIT PROGRESS NOTE  I connected with Paul Luna on 02/18/2021 at  9:15 AM EDT by telephone visit conference and verified that I am speaking with the correct person using two identifiers.  I also discussed with the patient that there may be a patient responsible charge related to this service. The patient expressed understanding and agreed to proceed.  Patient's Location: Home Physician Location: Clinic  CHIEF COMPLIANT: Follow-up of recurrent DVT  INTERVAL HISTORY: Paul Luna is a 80 y.o. male with above-mentioned history of DVT and PE, and retroperitoneal hematoma, currently on Coumadin. Left lower extremity US on 02/04/21 showed a chronic left DVT. He presents over MyChart today for follow-up.   He was switched from Coumadin to Xarelto about a week ago and he appears to be doing very well.  This is because he was not able to maintain his INR in the therapeutic range.  Observations/Objective:  There were no vitals filed for this visit. There is no height or weight on file to calculate BMI.  I have reviewed the data as listed CMP Latest Ref Rng & Units 09/14/2020 07/07/2020 07/06/2020  Glucose 70 - 99 mg/dL 89 130(H) 131(H)  BUN 6 - 23 mg/dL 19 28(H) 19  Creatinine 0.40 - 1.50 mg/dL 1.01 0.96 0.80  Sodium 135 - 145 mEq/L 143 139 141  Potassium 3.5 - 5.1 mEq/L 3.6 4.1 3.7  Chloride 96 - 112 mEq/L 103 102 105  CO2 19 - 32 mEq/L 29 26 26   Calcium 8.4 - 10.5 mg/dL 9.2 8.4(L) 8.1(L)  Total Protein 6.0 - 8.3 g/dL 6.9 - -  Total Bilirubin 0.2 - 1.2 mg/dL 0.5 - -  Alkaline Phos 39 - 117 U/L 63 - -  AST 0 - 37 U/L 14 - -  ALT 0 - 53 U/L 19 - -    Lab Results  Component Value Date   WBC 6.2 09/14/2020   HGB 15.9 09/14/2020   HCT 47.6 09/14/2020   MCV 91.6 09/14/2020   PLT 207.0 09/14/2020   NEUTROABS 4.4 09/14/2020      Assessment Plan:  DVT (deep venous thrombosis) History of DVT on Coumadin previously Hospitalization October 2021: Retroperitoneal  bleed extending into the thigh Severe intractable pain in the leg: Improved with muscle relaxants and discontinuation of Coumadin. 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  RTC in 1 year  I discussed the assessment and treatment plan with the patient. The patient was provided an opportunity to ask questions and all were answered. The patient agreed with the plan and demonstrated an understanding of the instructions. The patient was advised to call back or seek an in-person evaluation if the symptoms worsen or if the condition fails to improve as anticipated.   Total time spent: 12 minutes including face-to-face MyChart video visit time and time spent for planning, charting and coordination of care  Rulon Eisenmenger, MD 02/18/2021   I, Cloyde Reams Dorshimer, am acting as scribe for Nicholas Lose, MD.  I have reviewed the above documentation for accuracy and completeness, and I agree with the above.

## 2021-02-19 ENCOUNTER — Telehealth: Payer: Self-pay | Admitting: Hematology and Oncology

## 2021-02-19 ENCOUNTER — Other Ambulatory Visit: Payer: Self-pay

## 2021-02-19 NOTE — Telephone Encounter (Signed)
Scheduled appointment per 05/19 los. Patient is aware.

## 2021-03-02 ENCOUNTER — Ambulatory Visit (HOSPITAL_COMMUNITY)
Admission: RE | Admit: 2021-03-02 | Discharge: 2021-03-02 | Disposition: A | Discharge status: Home / Self Care | Payer: Medicare Other | Source: Ambulatory Visit | Attending: Gastroenterology | Admitting: Gastroenterology

## 2021-03-02 ENCOUNTER — Other Ambulatory Visit: Payer: Self-pay

## 2021-03-02 DIAGNOSIS — K5100 Ulcerative (chronic) pancolitis without complications: Secondary | ICD-10-CM | POA: Insufficient documentation

## 2021-03-02 MED ORDER — VEDOLIZUMAB 300 MG IV SOLR
300.0000 mg | INTRAVENOUS | Status: DC
  Administered 2021-03-02: 300 mg via INTRAVENOUS
  Filled 2021-03-02: qty 5

## 2021-04-27 ENCOUNTER — Ambulatory Visit (HOSPITAL_COMMUNITY)
Admission: RE | Admit: 2021-04-27 | Discharge: 2021-04-27 | Disposition: A | Discharge status: Home / Self Care | Payer: Medicare Other | Source: Ambulatory Visit | Attending: Gastroenterology | Admitting: Gastroenterology

## 2021-04-27 ENCOUNTER — Other Ambulatory Visit: Payer: Self-pay

## 2021-04-27 DIAGNOSIS — K5100 Ulcerative (chronic) pancolitis without complications: Secondary | ICD-10-CM | POA: Diagnosis present

## 2021-04-27 MED ORDER — VEDOLIZUMAB 300 MG IV SOLR
300.0000 mg | INTRAVENOUS | Status: DC
  Administered 2021-04-27: 300 mg via INTRAVENOUS
  Filled 2021-04-27: qty 5

## 2021-06-03 DIAGNOSIS — C61 Malignant neoplasm of prostate: Secondary | ICD-10-CM

## 2021-06-03 HISTORY — DX: Malignant neoplasm of prostate: C61

## 2021-06-22 ENCOUNTER — Encounter (HOSPITAL_COMMUNITY)
Admission: RE | Admit: 2021-06-22 | Discharge: 2021-06-22 | Disposition: A | Discharge status: Home / Self Care | Payer: Medicare Other | Source: Ambulatory Visit | Attending: Gastroenterology | Admitting: Gastroenterology

## 2021-06-22 ENCOUNTER — Telehealth: Payer: Self-pay

## 2021-06-22 DIAGNOSIS — K5100 Ulcerative (chronic) pancolitis without complications: Secondary | ICD-10-CM

## 2021-06-22 MED ORDER — VEDOLIZUMAB 300 MG IV SOLR
300.0000 mg | INTRAVENOUS | Status: DC
  Administered 2021-06-22: 300 mg via INTRAVENOUS
  Filled 2021-06-22: qty 5

## 2021-06-22 NOTE — Progress Notes (Signed)
Spoke to [employer] at Dr. Jillyn Hidden office re: patient stating he was just recently diagnosed with prostate cancer, and no treatment has been started yet.  Per Dr. Margreta Journey we were okay to proceed with entyvio IV today but patient should call  office and discuss future treatment. Patient is aware and stated he will call Dr. Woodward Ku office.

## 2021-06-22 NOTE — Telephone Encounter (Signed)
Ok to proceed with Entyvio infusion as discussed. Please schedule follow up visit next available. Thanks

## 2021-06-22 NOTE — Telephone Encounter (Signed)
Patient is not home. Male answering the phone says calling tomorrow would be good.

## 2021-06-22 NOTE — Telephone Encounter (Signed)
Paul Luna is at the [facility] today to receive his scheduled Entyvio infusion for diagnosis of Ulcerative Colitis. Patient reports to nurse he has  a new diagnosis of prostate cancer. No treatment of the cancer has been started.  Records are in [facility]. He received the diagnosis officially on 06/08/21.  He is comfortable continuing the Entyvio infusions. The nurse initiated the call to be certain this was okay. Last infusion was 04/27/21.

## 2021-06-23 NOTE — Telephone Encounter (Signed)
Called to speak with the patient. He is presently at an eye doctor appointment.

## 2021-06-28 NOTE — Telephone Encounter (Signed)
Spoke with the patient. Appointment scheduled to come in and discuss his treatment plan with Dr Silverio Decamp.

## 2021-07-13 ENCOUNTER — Telehealth: Payer: Self-pay | Admitting: Gastroenterology

## 2021-07-13 NOTE — Telephone Encounter (Signed)
Leafy Kindle PA @ [facility] called and would like to change the patient from [facility] to Remicade, because he has very active Rheumatoid Arthritis. States this could treat both his UC and the RA and they could infuse it at there facility. She can be reached at [phone]. Please advise.

## 2021-07-13 NOTE — Telephone Encounter (Signed)
I called back to [place] and left a detailed message for Leafy Kindle PA's nurse concerning changing to Remicade. Asked her to call back if any questions

## 2021-07-13 NOTE — Telephone Encounter (Signed)
No contraindication from GI standpoint to switch to Remicade.  Please schedule office follow-up visit soon with me or app in 4 to 6 weeks for follow-up.  Thank you

## 2021-07-13 NOTE — Telephone Encounter (Signed)
Please verify if rheumatology will initiate approval from insurance to start Remicade and if they need any additional documentation from GI?  Thanks

## 2021-07-13 NOTE — Telephone Encounter (Signed)
Leafy Kindle, [place], [facility], [phone], has questions about medications for patient.  Please call.  Thank you.

## 2021-07-13 NOTE — Telephone Encounter (Signed)
Patient already has an office visit scheduled on 08/02/21. Do you want that bumped out farther ?

## 2021-07-14 NOTE — Telephone Encounter (Signed)
Ok, please keep the appointment as scheduled. Thanks

## 2021-08-02 ENCOUNTER — Ambulatory Visit: Payer: Medicare Other | Admitting: Gastroenterology

## 2021-08-17 ENCOUNTER — Encounter (HOSPITAL_COMMUNITY): Payer: Medicare Other

## 2021-11-23 ENCOUNTER — Encounter: Payer: Self-pay | Admitting: Hematology and Oncology

## 2021-11-24 ENCOUNTER — Other Ambulatory Visit: Payer: Self-pay

## 2021-11-24 DIAGNOSIS — I825Z9 Chronic embolism and thrombosis of unspecified deep veins of unspecified distal lower extremity: Secondary | ICD-10-CM

## 2021-11-25 ENCOUNTER — Ambulatory Visit (HOSPITAL_COMMUNITY)
Admission: RE | Admit: 2021-11-25 | Discharge: 2021-11-25 | Disposition: A | Discharge status: Home / Self Care | Payer: Medicare Other | Source: Ambulatory Visit | Attending: Hematology and Oncology | Admitting: Hematology and Oncology

## 2021-11-25 ENCOUNTER — Other Ambulatory Visit: Payer: Self-pay

## 2021-11-25 DIAGNOSIS — I825Z2 Chronic embolism and thrombosis of unspecified deep veins of left distal lower extremity: Secondary | ICD-10-CM | POA: Diagnosis not present

## 2021-11-25 DIAGNOSIS — I825Z9 Chronic embolism and thrombosis of unspecified deep veins of unspecified distal lower extremity: Secondary | ICD-10-CM | POA: Diagnosis present

## 2021-11-25 NOTE — Progress Notes (Signed)
Patient Care Team: Lilian Coma., MD as PCP - General (Internal Medicine) Calvert Cantor, MD as Consulting Physician (Ophthalmology) Earnie Larsson, MD as Consulting Physician (Neurosurgery) Everardo Pacific, MD (Urology) Laurence Spates, MD (Inactive) as Consulting Physician (Gastroenterology)  DIAGNOSIS:    ICD-10-CM   1. Chronic deep vein thrombosis (DVT) of distal vein of lower extremity, unspecified laterality (HCC)  I82.5Z9      CHIEF COMPLIANT: Follow-up of recurrent DVT  INTERVAL HISTORY: Paul Luna is a 81 y.o. with above-mentioned history of DVT and PE, and retroperitoneal hematoma, currently on Coumadin. He presents to the clinic today for follow-up.  He complains of swelling and discomfort of the legs  and therefore we obtained bilateral lower leg DVT ultrasounds for further evaluation.  ALLERGIES:  has No Known Allergies.  MEDICATIONS:  Current Outpatient Medications  Medication Sig Dispense Refill   ascorbic acid (VITAMIN C) 500 MG tablet Take 500 mg by mouth daily.     budesonide (ENTOCORT EC) 3 MG 24 hr capsule Take 1 capsule (3 mg total) by mouth daily. 90 capsule 1   Calcium Carbonate (CALCIUM 600 PO) Take 1 tablet by mouth daily.     Cholecalciferol (VITAMIN D-3 PO) Take 1 tablet by mouth daily.     ezetimibe (ZETIA) 10 MG tablet Take 1 tablet by mouth daily.     predniSONE (DELTASONE) 5 MG tablet Take 3 tablets (15 mg total) by mouth daily with breakfast.     RIVAROXABAN (XARELTO) VTE STARTER PACK (15 & 20 MG) Follow package directions: Take one 15mg  tablet by mouth twice a day. On day 22, switch to one 20mg  tablet once a day. Take with food. 51 each 0   sertraline (ZOLOFT) 100 MG tablet Take 200 mg by mouth daily.     simvastatin (ZOCOR) 40 MG tablet Take 1 tablet by mouth daily.     Vedolizumab (ENTYVIO IV) Inject 1 each into the vein. Every 8 weeks     No current facility-administered medications for this visit.    PHYSICAL EXAMINATION: ECOG PERFORMANCE  STATUS: 1 - Symptomatic but completely ambulatory  There were no vitals filed for this visit. There were no vitals filed for this visit.  LABORATORY DATA:  I have reviewed the data as listed CMP Latest Ref Rng & Units 09/14/2020 07/07/2020 07/06/2020  Glucose 70 - 99 mg/dL 89 130(H) 131(H)  BUN 6 - 23 mg/dL 19 28(H) 19  Creatinine 0.40 - 1.50 mg/dL 1.01 0.96 0.80  Sodium 135 - 145 mEq/L 143 139 141  Potassium 3.5 - 5.1 mEq/L 3.6 4.1 3.7  Chloride 96 - 112 mEq/L 103 102 105  CO2 19 - 32 mEq/L 29 26 26   Calcium 8.4 - 10.5 mg/dL 9.2 8.4(L) 8.1(L)  Total Protein 6.0 - 8.3 g/dL 6.9 - -  Total Bilirubin 0.2 - 1.2 mg/dL 0.5 - -  Alkaline Phos 39 - 117 U/L 63 - -  AST 0 - 37 U/L 14 - -  ALT 0 - 53 U/L 19 - -    Lab Results  Component Value Date   WBC 6.2 09/14/2020   HGB 15.9 09/14/2020   HCT 47.6 09/14/2020   MCV 91.6 09/14/2020   PLT 207.0 09/14/2020   NEUTROABS 4.4 09/14/2020       ASSESSMENT & PLAN:  DVT (deep venous thrombosis) History of DVT on Coumadin previously Hospitalization October 2021: Retroperitoneal bleed extending into the thigh Severe intractable pain in the leg: Improved with muscle relaxants and discontinuation of Coumadin.  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  No evidence of common femoral vein occlusion on the right.   Leg swelling: I asked him to double up on his Lasix to 80 mg a day, leg elevation, started him on antibiotics with Keflex to prevent any cellulitis.  He will need to remain on anticoagulation for life. He will inform  us in a couple of weeks if his symptoms are getting better.  No orders of the defined types were placed in this encounter.  The patient has a good understanding of the overall plan. he agrees with it. he will call with any problems that may develop before the next visit here.  Total time spent: 20 mins including face to face time and time spent for planning, charting and coordination of care  Rulon Eisenmenger, MD, MPH 11/26/2021  I, Thana Ates, am acting as scribe for Dr. Nicholas Lose.  I have reviewed the above documentation for accuracy and completeness, and I agree with the above.

## 2021-11-25 NOTE — Progress Notes (Signed)
Left lower extremity venous duplex completed. Refer to "CV Proc" under chart review to view preliminary results.  11/25/2021 1:55 PM Kelby Aline., MHA, RVT, RDCS, RDMS

## 2021-11-25 NOTE — Assessment & Plan Note (Signed)
History of DVT on Coumadin previously Hospitalization October 2021: Retroperitoneal bleed extending into the thigh Severe intractable pain in the leg: Improved with muscle relaxants and discontinuation of Coumadin. 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  Ultrasound Dopplers 11/25/2021:

## 2021-11-26 ENCOUNTER — Inpatient Hospital Stay: Payer: Medicare Other | Attending: Hematology and Oncology | Admitting: Hematology and Oncology

## 2021-11-26 DIAGNOSIS — Z86718 Personal history of other venous thrombosis and embolism: Secondary | ICD-10-CM | POA: Insufficient documentation

## 2021-11-26 DIAGNOSIS — Z7901 Long term (current) use of anticoagulants: Secondary | ICD-10-CM | POA: Insufficient documentation

## 2021-11-26 DIAGNOSIS — Z86711 Personal history of pulmonary embolism: Secondary | ICD-10-CM | POA: Diagnosis not present

## 2021-11-26 DIAGNOSIS — I825Z9 Chronic embolism and thrombosis of unspecified deep veins of unspecified distal lower extremity: Secondary | ICD-10-CM

## 2021-11-26 MED ORDER — CEPHALEXIN 500 MG PO CAPS: 500.0000 mg | ORAL_CAPSULE | Freq: Three times a day (TID) | ORAL | 0 refills | Status: DC

## 2021-11-26 MED ORDER — LOSARTAN POTASSIUM 50 MG PO TABS: 50.0000 mg | ORAL_TABLET | Freq: Every day | ORAL | Status: AC

## 2021-11-26 MED ORDER — FUROSEMIDE 40 MG PO TABS: 40.0000 mg | ORAL_TABLET | Freq: Every day | ORAL | Status: DC

## 2021-11-26 MED ORDER — PREDNISONE 5 MG PO TABS: 5.0000 mg | ORAL_TABLET | Freq: Every day | ORAL | Status: DC

## 2021-12-15 ENCOUNTER — Other Ambulatory Visit: Payer: Self-pay | Admitting: *Deleted

## 2021-12-15 ENCOUNTER — Encounter: Payer: Self-pay | Admitting: Hematology and Oncology

## 2021-12-15 DIAGNOSIS — M79605 Pain in left leg: Secondary | ICD-10-CM

## 2021-12-15 NOTE — Progress Notes (Signed)
Per MD request, referral placed to Dr. Trula Slade with [facility].  ?

## 2021-12-25 ENCOUNTER — Other Ambulatory Visit: Payer: Self-pay

## 2021-12-25 DIAGNOSIS — I825Z9 Chronic embolism and thrombosis of unspecified deep veins of unspecified distal lower extremity: Secondary | ICD-10-CM

## 2022-01-03 ENCOUNTER — Ambulatory Visit (HOSPITAL_COMMUNITY)
Admission: RE | Admit: 2022-01-03 | Discharge: 2022-01-03 | Disposition: A | Discharge status: Home / Self Care | Payer: Medicare Other | Source: Ambulatory Visit | Attending: Vascular Surgery | Admitting: Vascular Surgery

## 2022-01-03 ENCOUNTER — Encounter: Payer: Self-pay | Admitting: Vascular Surgery

## 2022-01-03 ENCOUNTER — Ambulatory Visit (INDEPENDENT_AMBULATORY_CARE_PROVIDER_SITE_OTHER): Payer: Medicare Other | Admitting: Vascular Surgery

## 2022-01-03 VITALS — BP 117/56 | HR 84 | Temp 98.4°F | Resp 18 | Ht 73.0 in | Wt 226.0 lb

## 2022-01-03 DIAGNOSIS — I825Z2 Chronic embolism and thrombosis of unspecified deep veins of left distal lower extremity: Secondary | ICD-10-CM

## 2022-01-03 DIAGNOSIS — I872 Venous insufficiency (chronic) (peripheral): Secondary | ICD-10-CM

## 2022-01-03 DIAGNOSIS — I825Z9 Chronic embolism and thrombosis of unspecified deep veins of unspecified distal lower extremity: Secondary | ICD-10-CM | POA: Insufficient documentation

## 2022-01-04 ENCOUNTER — Encounter: Payer: Self-pay | Admitting: Vascular Surgery

## 2022-01-04 ENCOUNTER — Other Ambulatory Visit: Payer: Self-pay

## 2022-01-04 DIAGNOSIS — R0601 Orthopnea: Secondary | ICD-10-CM

## 2022-01-04 NOTE — Progress Notes (Addendum)
?Office Note  ? ? ? ?CC: Bilateral lower extremity edema ?Requesting Provider:  Nicholas Lose, MD ? ?HPI: Paul Luna is a 81 y.o. (July 16, 1941) male who presents at the request of Paul Luna., MD for evaluation of bilateral lower extremity edema.  [name] has had bilateral lower extremity swelling and edema for a number of years.  This first began with his left roughly 10 years ago, with right leg following roughly 5 years ago.  He wears compression stockings daily, but continues to appreciate heaviness, aching, pitting edema.  He denies bleeding or ulcerations. ? ?There is a history of left lower extremity DVT, however on last duplex ultrasound, there was no residual thrombus.  He also has a history of superficial thrombophlebitis.  No history of venous surgery, no history of lymphedema. ? ?Over the last month, Tylan has also appreciated some orthopnea at night with labored breathing and generalized tired feeling after completing simple tasks. ? ?The pt is on a statin for cholesterol management.  ?The pt is not on a daily aspirin.   Other AC: Xarelto ?The pt is on medications for hypertension.   ?The pt is not diabetic.   ?Tobacco hx:  - ? ?Past Medical History:  ?Diagnosis Date  ? Adenomatous colon polyp   ? Anxiety   ? chronic  ? Arthritis   ? Bladder cancer ([place]) 1991  ? Clotting disorder ([place]) 2017  ? following hip surgery  ? HLD (hyperlipidemia)   ? Hypertension   ? UC (ulcerative colitis) ([place])   ? ? ?Past Surgical History:  ?Procedure Laterality Date  ? bladder cancer surgery  1991  ? managed by urologist  ? COLONOSCOPY  2021  ? Hx of many colon's; last 6 months ago, todays is a follow up to the   ? COLONOSCOPY W/ BIOPSIES AND POLYPECTOMY    ? Hx: of  ? LUMBAR LAMINECTOMY/DECOMPRESSION MICRODISCECTOMY Right 04/08/2013  ? Procedure: LUMBAR FOUR-FIVE LUMBAR LAMINECTOMY/DECOMPRESSION MICRODISCECTOMY 1 LEVEL;  Surgeon: Charlie Pitter, MD;  Location: [facility];  Service: Neurosurgery;  Laterality: Right;  ?  TOTAL HIP ARTHROPLASTY Right   ? TOTAL HIP ARTHROPLASTY Left   ? ? ?Social History  ? ?Socioeconomic History  ? Marital status: Married  ?  Spouse name: Not on file  ? Number of children: 2  ? Years of education: Not on file  ? Highest education level: Not on file  ?Occupational History  ? Occupation: retired  ?Tobacco Use  ? Smoking status: Former  ?  Types: Cigarettes  ?  Quit date: 10/03/1989  ?  Years since quitting: 32.2  ? Smokeless tobacco: Never  ?Vaping Use  ? Vaping Use: Never used  ?Substance and Sexual Activity  ? Alcohol use: Yes  ?  Comment: occ  ? Drug use: No  ? Sexual activity: Yes  ?  Partners: Male, Male  ?  Birth control/protection: None  ?Other Topics Concern  ? Not on file  ?Social History Narrative  ? Not on file  ? ?Social Determinants of Health  ? ?Financial Resource Strain: Not on file  ?Food Insecurity: Not on file  ?Transportation Needs: Not on file  ?Physical Activity: Not on file  ?Stress: Not on file  ?Social Connections: Not on file  ?Intimate Partner Violence: Not on file  ? ?Family History  ?Problem Relation Age of Onset  ? Hypertension Mother   ? Heart disease Mother 78  ? Hypertension Father   ? Arthritis Father   ? Stroke Father  21  ? Colon cancer Brother 106  ? Colon polyps Brother   ? Heart disease Brother 70  ? Esophageal cancer Neg Hx   ? Rectal cancer Neg Hx   ? Stomach cancer Neg Hx   ? ? ?Current Outpatient Medications  ?Medication Sig Dispense Refill  ? ascorbic acid (VITAMIN C) 500 MG tablet Take 500 mg by mouth daily.    ? Calcium Carbonate (CALCIUM 600 PO) Take 1 tablet by mouth daily.    ? Cholecalciferol (VITAMIN D-3 PO) Take 1 tablet by mouth daily.    ? ezetimibe (ZETIA) 10 MG tablet Take 1 tablet by mouth daily.    ? folic acid (FOLVITE) 1 MG tablet Take 1 tablet by mouth daily.    ? furosemide (LASIX) 40 MG tablet Take 1 tablet (40 mg total) by mouth daily. 30 tablet   ? inFLIXimab (REMICADE) 100 MG injection See admin instructions.    ? losartan (COZAAR) 50 MG  tablet Take 1 tablet (50 mg total) by mouth daily.    ? methotrexate 2.5 MG tablet TAKE 10 TABLETS BY MOUTH ONCE PER WEEK    ? sertraline (ZOLOFT) 100 MG tablet Take 200 mg by mouth daily.    ? simvastatin (ZOCOR) 40 MG tablet Take 1 tablet by mouth daily.    ? budesonide (ENTOCORT EC) 3 MG 24 hr capsule Take 1 capsule (3 mg total) by mouth daily. 90 capsule 1  ? cephALEXin (KEFLEX) 500 MG capsule Take 1 capsule (500 mg total) by mouth 3 (three) times daily. (Patient not taking: Reported on 01/03/2022) 21 capsule 0  ? predniSONE (DELTASONE) 5 MG tablet Take 1 tablet (5 mg total) by mouth daily with breakfast. (Patient not taking: Reported on 01/03/2022)    ? RIVAROXABAN (XARELTO) VTE STARTER PACK (15 & 20 MG) Follow package directions: Take one '15mg'$  tablet by mouth twice a day. On day 22, switch to one '20mg'$  tablet once a day. Take with food. 51 each 0  ? Vedolizumab (ENTYVIO IV) Inject 1 each into the vein. Every 8 weeks    ? ?No current facility-administered medications for this visit.  ? ? ?No Known Allergies ? ? ?REVIEW OF SYSTEMS:  ?'[X]'$  denotes positive finding, '[ ]'$  denotes negative finding ?Cardiac  Comments:  ?Chest pain or chest pressure:    ?Shortness of breath upon exertion:    ?Short of breath when lying flat:    ?Irregular heart rhythm:    ?    ?Vascular    ?Pain in calf, thigh, or hip brought on by ambulation:    ?Pain in feet at night that wakes you up from your sleep:     ?Blood clot in your veins:    ?Leg swelling:     ?    ?Pulmonary    ?Oxygen at home:    ?Productive cough:     ?Wheezing:     ?    ?Neurologic    ?Sudden weakness in arms or legs:     ?Sudden numbness in arms or legs:     ?Sudden onset of difficulty speaking or slurred speech:    ?Temporary loss of vision in one eye:     ?Problems with dizziness:     ?    ?Gastrointestinal    ?Blood in stool:     ?Vomited blood:     ?    ?Genitourinary    ?Burning when urinating:     ?Blood in urine:    ?    ?  Psychiatric    ?Major depression:     ?     ?Hematologic    ?Bleeding problems:    ?Problems with blood clotting too easily:    ?    ?Skin    ?Rashes or ulcers:    ?    ?Constitutional    ?Fever or chills:    ? ? ?PHYSICAL EXAMINATION: ? ?Vitals:  ? 01/03/22 1504  ?BP: (!) 117/56  ?Pulse: 84  ?Resp: 18  ?Temp: 98.4 ?F (36.9 ?C)  ?TempSrc: Temporal  ?SpO2: 95%  ?Weight: 226 lb (102.5 kg)  ?Height: '6\' 1"'$  (1.854 m)  ? ? ?General:  WDWN in NAD; vital signs documented above ?Gait: Not observed ?HENT: WNL, normocephalic ?Pulmonary: normal non-labored breathing ?Cardiac: regular HR ?Abdomen: soft, NT, no masses ?Skin: without rashes ?Vascular Exam/Pulses: ? Right Left  ?Radial 2+ (normal) 2+ (normal)  ?Ulnar 2+ (normal) 2+ (normal)  ?Femoral    ?Popliteal    ?DP 2+ (normal) 2+ (normal)  ?PT    ? ?Extremities: without ischemic changes, without Gangrene , without cellulitis; without open wounds;  ? ?2+ pitting edema, minimal fibrosis, no appreciable hemosiderin deposits, Stemmer sign negative ?Musculoskeletal: no muscle wasting or atrophy  ?Neurologic: A&O X 3;  No focal weakness or paresthesias are detected ?Psychiatric:  The pt has Normal affect. ? ? ?Non-Invasive Vascular Imaging:   ? ?Summary:  ?Left:  ?- No evidence of deep vein thrombosis seen in the left lower extremity,  ?from the common femoral through the popliteal veins.  ?- No evidence of superficial venous thrombosis in the left lower  ?extremity.  ?- 3.85 x 1.63 fluid collection visualized in the popliteal fossa.  ? ? ? ?ASSESSMENT/PLAN:: 81 y.o. male presenting with bilateral lower extremity swelling with associated heaviness and pain by days and.  Nicole Kindred has a history of DVT and superficial venous thrombosis, both of which have resolved.  Imaging was reviewed demonstrating both superficial and deep venous incompetence in the left leg.  No varicosities, no telangiectasias were appreciated.  On physical exam there was a significant amount of edema Nicole Kindred has been managing with compression wraps for  the last several years. CEAP 3 ? ?Nicole Kindred has chronic venous insufficiency, however there could also be other factors playing a role.  His symptoms of orthopnea as well as labored breathing with small tasks are

## 2022-01-06 ENCOUNTER — Encounter: Payer: Self-pay | Admitting: Vascular Surgery

## 2022-01-12 ENCOUNTER — Ambulatory Visit (HOSPITAL_COMMUNITY)
Admission: RE | Admit: 2022-01-12 | Discharge: 2022-01-12 | Disposition: A | Discharge status: Home / Self Care | Payer: Medicare Other | Source: Ambulatory Visit | Attending: Vascular Surgery | Admitting: Vascular Surgery

## 2022-01-12 DIAGNOSIS — E785 Hyperlipidemia, unspecified: Secondary | ICD-10-CM | POA: Diagnosis not present

## 2022-01-12 DIAGNOSIS — I77819 Aortic ectasia, unspecified site: Secondary | ICD-10-CM | POA: Insufficient documentation

## 2022-01-12 DIAGNOSIS — R008 Other abnormalities of heart beat: Secondary | ICD-10-CM | POA: Diagnosis not present

## 2022-01-12 DIAGNOSIS — I080 Rheumatic disorders of both mitral and aortic valves: Secondary | ICD-10-CM | POA: Diagnosis not present

## 2022-01-12 DIAGNOSIS — I10 Essential (primary) hypertension: Secondary | ICD-10-CM | POA: Diagnosis not present

## 2022-01-12 DIAGNOSIS — R0601 Orthopnea: Secondary | ICD-10-CM

## 2022-01-12 DIAGNOSIS — I491 Atrial premature depolarization: Secondary | ICD-10-CM | POA: Diagnosis present

## 2022-01-12 LAB — ECHOCARDIOGRAM COMPLETE
Area-P 1/2: 3.83 cm2
S' Lateral: 3.3 cm

## 2022-01-14 ENCOUNTER — Other Ambulatory Visit: Payer: Self-pay

## 2022-01-17 ENCOUNTER — Other Ambulatory Visit (INDEPENDENT_AMBULATORY_CARE_PROVIDER_SITE_OTHER): Payer: Medicare Other

## 2022-01-17 ENCOUNTER — Encounter: Payer: Self-pay | Admitting: Nurse Practitioner

## 2022-01-17 ENCOUNTER — Ambulatory Visit (INDEPENDENT_AMBULATORY_CARE_PROVIDER_SITE_OTHER): Payer: Medicare Other | Admitting: Nurse Practitioner

## 2022-01-17 VITALS — BP 124/70 | HR 76 | Ht 73.0 in | Wt 219.0 lb

## 2022-01-17 DIAGNOSIS — K5100 Ulcerative (chronic) pancolitis without complications: Secondary | ICD-10-CM

## 2022-01-17 LAB — SEDIMENTATION RATE: Sed Rate: 24 mm/hr — ABNORMAL HIGH (ref 0–20)

## 2022-01-17 NOTE — Patient Instructions (Signed)
Please proceed to the basement level for lab work before leaving today. Press "B" on the elevator. The lab is located at the first door on the left as you exit the elevator. ? ?HEALTHCARE LAWS AND MY CHART RESULTS:  ? ?Due to recent changes in healthcare laws, you may see results of your imaging and/or laboratory studies on MyChart before I have had a chance to review them.  I understand that in some cases there may be results that are confusing or concerning to you. Please understand that not all results are received at the same time and often I may need to interpret multiple results in order to provide you with the best plan of care or course of treatment. Therefore, I ask that you please give me 48 hours to thoroughly review all your results before contacting my office for clarification.  ? ?Thank you for trusting me with your gastrointestinal care!   ? ?Tye Savoy, NP ? ? ? ? ?BMI: ? ?If you are age 79 or older, your body mass index should be between 23-30. Your Body mass index is 28.89 kg/m?Marland Kitchen If this is out of the aforementioned range listed, please consider follow up with your Primary Care Provider. ? ?If you are age 58 or younger, your body mass index should be between 19-25. Your Body mass index is 28.89 kg/m?Marland Kitchen If this is out of the aformentioned range listed, please consider follow up with your Primary Care Provider.  ? ?MY CHART: ? ?The [facility] providers would like to encourage you to use [facility] to communicate with providers for non-urgent requests or questions.  Due to long hold times on the telephone, sending your provider a message by [facility] may be a faster and more efficient way to get a response.  Please allow 48 business hours for a response.  Please remember that this is for non-urgent requests.  ? ?

## 2022-01-17 NOTE — Progress Notes (Signed)
? ? ? ?Assessment  ? ?Patient Profile:  ?Paul Luna is a 81 y.o. male known to Dr. 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 ? ?Ulcerative pancolitis. Diagnosed in 2016. Colonoscopy Sept 2021 ( Dr. Silverio Decamp) showed mildly active colitis in right colon and moderately active colitis in left colon. After discussion with Rheumatology he was changed from [facility] to Remicade in November 2022 in an effort to concurrently treat his RA. While RA symptoms greatly improved he has been having diarrhea for two months.   ? ?DVT / PE, on chronic Xarelto ? ?SOB, new over the last month. He has chronic bilateral lower extremity edema. Recent Echocardiogram done by Vascular ? ? ?Plan  ? ?He weighs 99 kg and currently only getting 100 mg of Remicade Q 8 weeks . For IBD treatment this dose needs to be increased but first will check stool for C-diff t rule out infectious etiology.   ?SOB ( new). May need to be further evaluated prior to increasing dose of Remicade. He does have bilateral lower extremity edema but it is chronic. Recent echo ordered by Vascular Medicine  ?Fecal calprotectin ?ESR, CRP ?Remicade trough and antibody level ?  ? ?History of Present Illness  ? ?Chief Complaint : Diarrhea ? ?Paul Luna has ulcerative pancoloitis. He established care her in July 2021, was on Budensonide 3 mg daily and had started [facility] in February 2021. At time of last visit in Feb 2022 we asked him to stop Budesonide after current prescription complete. Sometime in November ( after coordination with Rheumatology) he was changed to Remicade in an effort to concurrently treat his RA.  ? ?A couple of months ago Paul Luna began having diarrhea. Having frequent watery , non-bloody diarrhea, especially after eating. He really has to watch what he eats and has consequently lost about 18 pounds. No associated  abdominal pain. No recent antibiotics.  ? ?Rheumatology told him the dose of Remicade could be increased if needed from a GI standpoint ( without negative effects from RA standpoint).  ? ?Paul Luna has been SOB with normal activity over the last month. He has swelling in both legs but says this is chronic He did recently have an echocardiogram ordered by Vascular who he sees for history of DVT. He hasn't gotten results of echocardiogram  yet  ? ? ?Laboratory data: ? ?  Latest Ref Rng & Units 09/14/2020  ?  8:06 AM 07/03/2020  ?  8:29 PM 11/03/2014  ?  9:43 AM  ?Hepatic Function  ?Total Protein 6.0 - 8.3 g/dL 6.9   6.8   7.0    ?Albumin 3.5 - 5.2 g/dL 4.4   4.1   4.5    ?AST 0 - 37 U/L _0 ?ALT 0 - 53 U/L _1 ?Alk Phosphatase 39 - 117 U/L 63   55   53    ?Total Bilirubin 0.2 - 1.2 mg/dL 0.5   0.9   0.5    ? ? ? ?  Latest Ref Rng & Units 09/14/2020  ?  8:06 AM 07/07/2020  ?  4:03 AM 07/06/2020  ?  4:19 AM  ?CBC  ?WBC 4.0 - 10.5 K/uL 6.2   8.3   7.6    ?Hemoglobin 13.0 - 17.0 g/dL 15.9   11.7  11.8    ?Hematocrit 39.0 - 52.0 % 47.6   35.3   35.6    ?Platelets 150.0 - 400.0 K/uL 207.0   209   183    ? ? ?Previous GI Evaluations  ? ?Endoscopies:  ? ?September 2021 Colonoscopy  ?-Pancolitis. Inflammation was found in the rectum, in the sigmoid colon, in the descending ?colon, in the transverse colon and in the ascending colon. This was mild in severity. Biopsied. ?- Diverticulosis in the sigmoid colon and in the ascending colon. ?- Non-bleeding internal hemorrhoids. ? ?Surgical [P], colon, ascending and cecum ?- MILDLY ACTIVE CHRONIC COLITIS, CONSISTENT WITH PATIENT'S CLINICAL HISTORY OF ULCERATIVE COLITIS. SEE ?NOTE ?- NEGATIVE FOR DYSPLASIA ?2. Surgical [P], colon, transverse ?- MILDLY ACTIVE CHRONIC COLITIS, CONSISTENT WITH PATIENT'S CLINICAL HISTORY OF ULCERATIVE COLITIS. SEE ?NOTE ?- NEGATIVE FOR DYSPLASIA ?3. Surgical [P], colon, sigmoid and descending ?- MODERATELY ACTIVE CHRONIC COLITIS,  CONSISTENT WITH PATIENT'S CLINICAL HISTORY OF ULCERATIVE ?COLITIS. SEE NOTE ?- NEGATIVE FOR DYSPLASIA ?4. Surgical [P], colon, rectum ?- MODERATELY ACTIVE CHRONIC PROCTITIS, CONSISTENT WITH PATIENT'S CLINICAL HISTORY OF ULCERATIVE ?COLITIS. SEE NOTE ?- NEGATIVE FOR DYSPLASIA ? ?Imaging:  ? ?April 2023 Echo ?LVEF 60-65% ? ?Past Medical History:  ?Diagnosis Date  ? Adenomatous colon polyp   ? Anxiety   ? chronic  ? Arthritis   ? Bladder cancer ([place]) 1991  ? Clotting disorder ([place]) 2017  ? following hip surgery  ? HLD (hyperlipidemia)   ? Hypertension   ? UC (ulcerative colitis) ([place])   ? ? ?Past Surgical History:  ?Procedure Laterality Date  ? bladder cancer surgery  1991  ? managed by urologist  ? COLONOSCOPY  2021  ? Hx of many colon's; last 6 months ago, todays is a follow up to the   ? COLONOSCOPY W/ BIOPSIES AND POLYPECTOMY    ? Hx: of  ? LUMBAR LAMINECTOMY/DECOMPRESSION MICRODISCECTOMY Right 04/08/2013  ? Procedure: LUMBAR FOUR-FIVE LUMBAR LAMINECTOMY/DECOMPRESSION MICRODISCECTOMY 1 LEVEL;  Surgeon: Charlie Pitter, MD;  Location: [facility];  Service: Neurosurgery;  Laterality: Right;  ? TOTAL HIP ARTHROPLASTY Right   ? TOTAL HIP ARTHROPLASTY Left   ? ? ?Current Medications, Allergies, Family History and Social History were reviewed in [employer] record. ?  ?  ?Current Outpatient Medications  ?Medication Sig Dispense Refill  ? ascorbic acid (VITAMIN C) 500 MG tablet Take 500 mg by mouth daily.    ? Calcium Carbonate (CALCIUM 600 PO) Take 1 tablet by mouth daily.    ? Cholecalciferol (VITAMIN D-3 PO) Take 1 tablet by mouth daily.    ? ezetimibe (ZETIA) 10 MG tablet Take 1 tablet by mouth daily.    ? folic acid (FOLVITE) 1 MG tablet Take 1 tablet by mouth daily.    ? furosemide (LASIX) 40 MG tablet Take 1 tablet (40 mg total) by mouth daily. 30 tablet   ? inFLIXimab (REMICADE) 100 MG injection See admin instructions.    ? losartan (COZAAR) 50 MG tablet Take 1 tablet (50 mg total) by mouth  daily.    ? methotrexate 2.5 MG tablet TAKE 10 TABLETS BY MOUTH ONCE PER WEEK    ? predniSONE (DELTASONE) 5 MG tablet Take 1 tablet (5 mg total) by mouth daily with breakfast.    ? rivaroxaban (XARELTO) 20 MG TABS tablet 20 mg daily.    ? sertraline (ZOLOFT) 100 MG tablet Take 200 mg by mouth daily.    ? simvastatin (ZOCOR) 40 MG tablet Take 1 tablet by mouth  daily.    ? ?No current facility-administered medications for this visit.  ? ? ?Review of Systems: ?Positive for shortness of breath with minimal exertion. No chest pain.  No urinary complaints.  ? ? ?Physical Exam  ? ?Wt Readings from Last 3 Encounters:  ?01/17/22 219 lb (99.3 kg)  ?01/03/22 226 lb (102.5 kg)  ?11/26/21 237 lb 14.4 oz (107.9 kg)  ? ? ?BP 124/70   Pulse 76   Ht _0  (1.854 m)   Wt 219 lb (99.3 kg)   BMI 28.89 kg/m?  ?Constitutional:  Generally well appearing male in no acute distress. ?Psychiatric: Pleasant. Normal mood and affect. Behavior is normal. ?EENT: Pupils normal.  Conjunctivae are normal. No scleral icterus. ?Neck supple.  ?Cardiovascular: Normal rate, regular rhythm. No edema ?Pulmonary/chest: Effort normal and breath sounds normal. No wheezing, rales or rhonchi. ?Abdominal: Soft, nondistended, nontender. Bowel sounds active throughout. There are no masses palpable. No hepatomegaly. ?Neurological: Alert and oriented to person place and time. ?Skin: Skin is warm and dry. No rashes noted. ? ?Tye Savoy, NP  01/17/2022, 11:21 AM ? ? ? ? ? ? ? ? ? ?

## 2022-01-18 IMAGING — CT CT ABD-PELV W/ CM
2 of 5 series · 13 of 46 positions shown, 15 images · IV contrast (omnipaque)
Comparison: Contemporary lumbar CT reconstruction, MR lumbar spine
04/03/2013

CLINICAL DATA: Right lower quadrant abdominal pain

EXAM:
CT ABDOMEN AND PELVIS WITH CONTRAST
TECHNIQUE: Multidetector CT imaging of the abdomen and pelvis was performed
using the standard protocol following bolus administration of
intravenous contrast.
CONTRAST:  100mL OMNIPAQUE IOHEXOL 300 MG/ML  SOLN

[Series 2: axial st · axial · 0.85mm/px · z∈[+955,+1430]mm · 10 of 109 slices shown, 12 images]
[im 7/109  soft-tissue]
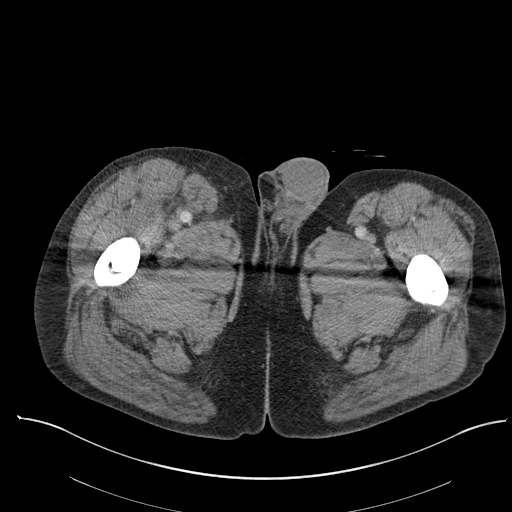
[im 7/109  bone]
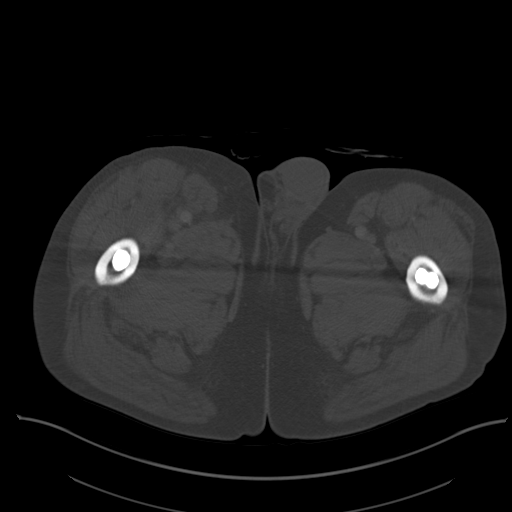
[im 21/109  soft-tissue]
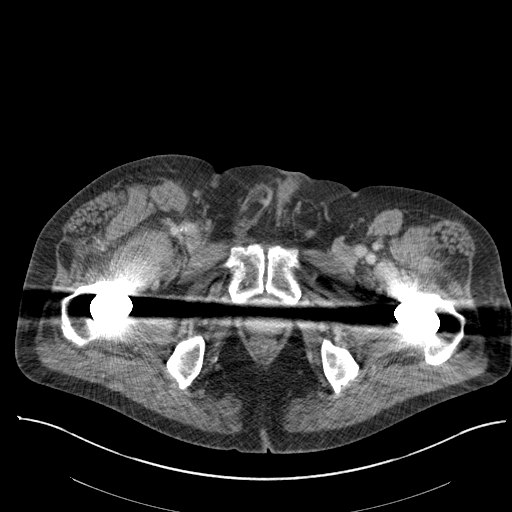
[im 28/109  soft-tissue]
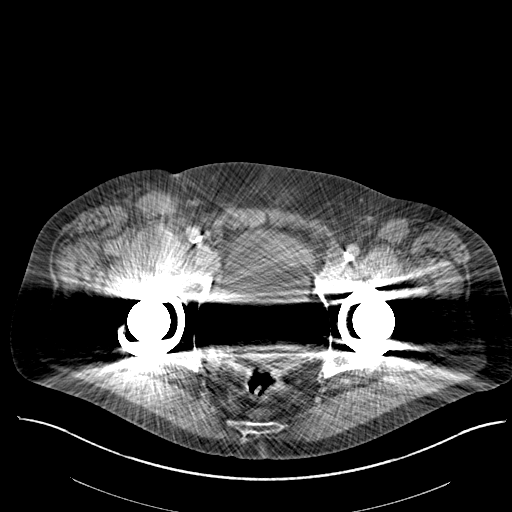
[im 41/109  soft-tissue]
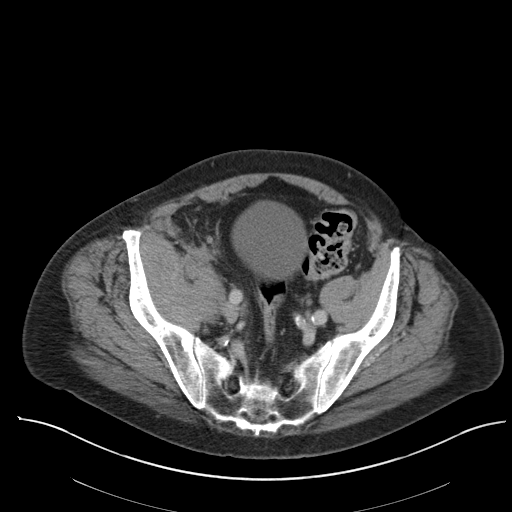
[im 48/109  soft-tissue]
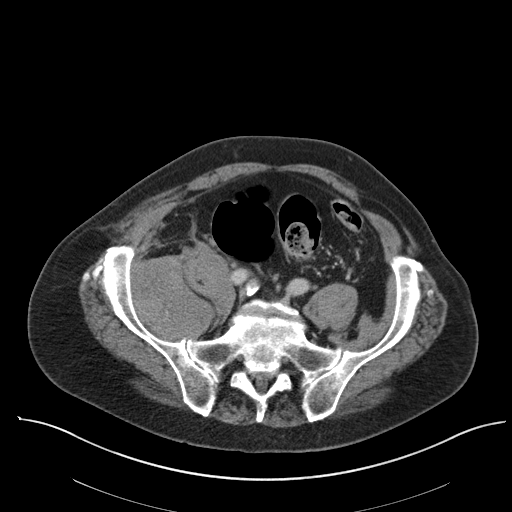
[im 61/109  soft-tissue]
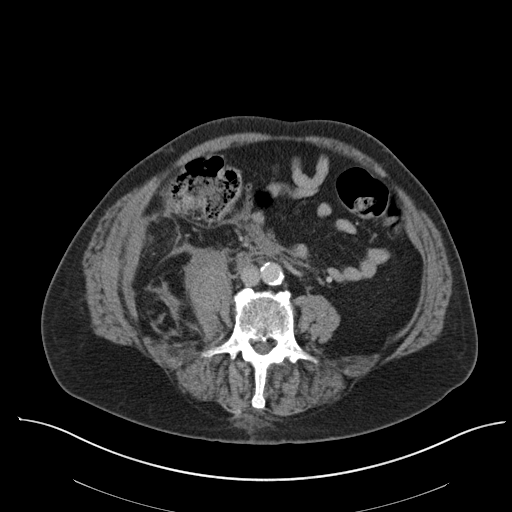
[im 68/109  soft-tissue]
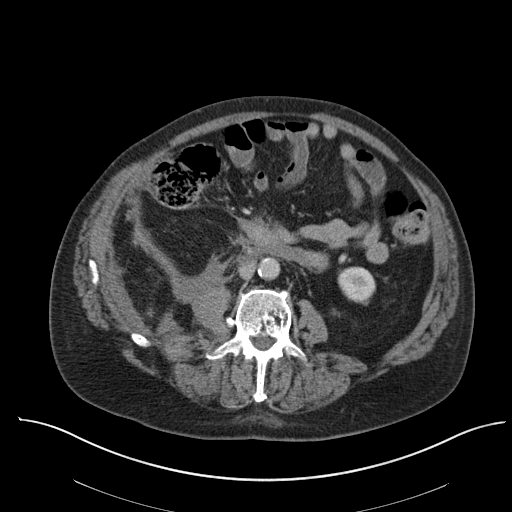
[im 82/109  soft-tissue]
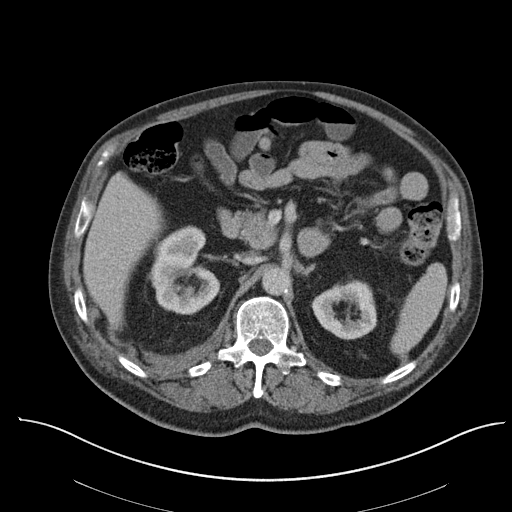
[im 88/109  soft-tissue]
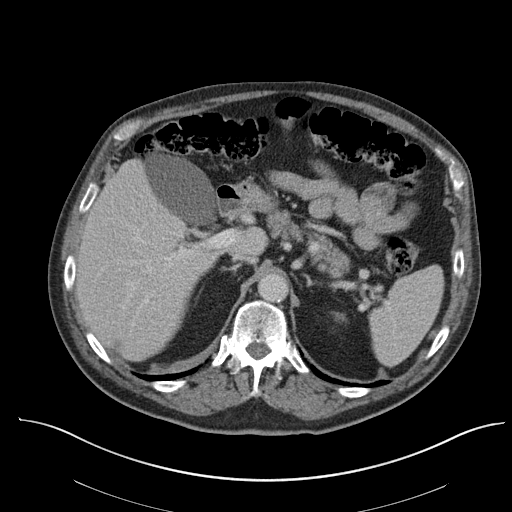
[im 88/109  bone]
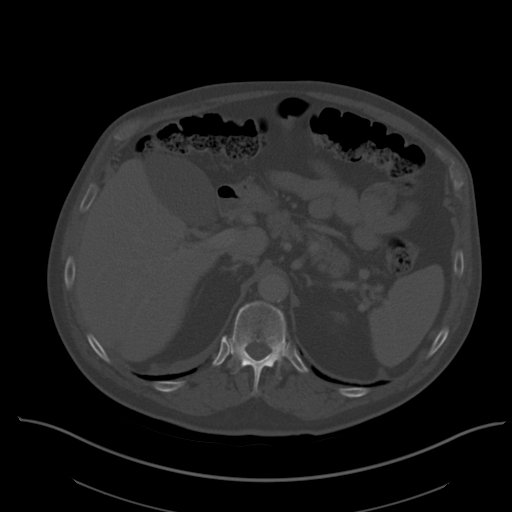
[im 102/109  soft-tissue]
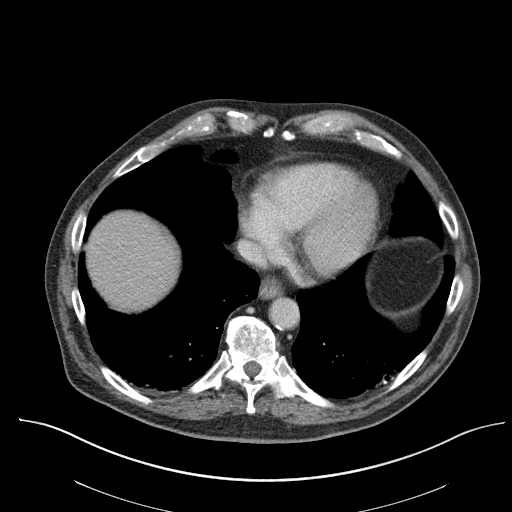

[Series 6: coronal st · coronal · 0.82mm/px · 3 of 168 slices shown]
[im 56/168  soft-tissue]
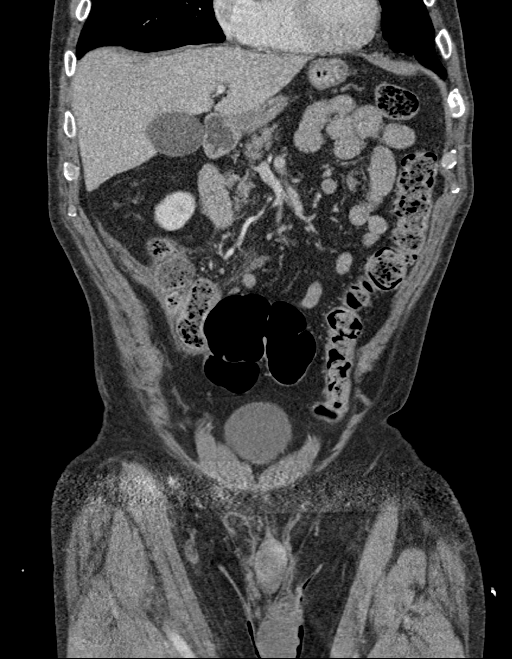
[im 75/168  soft-tissue]
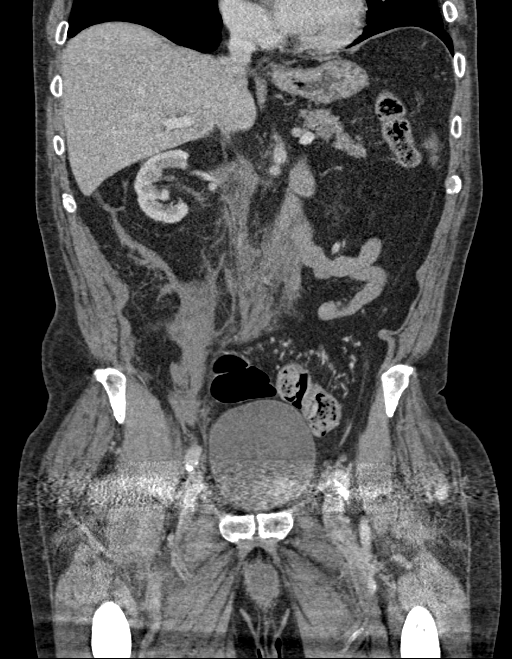
[im 93/168  soft-tissue]
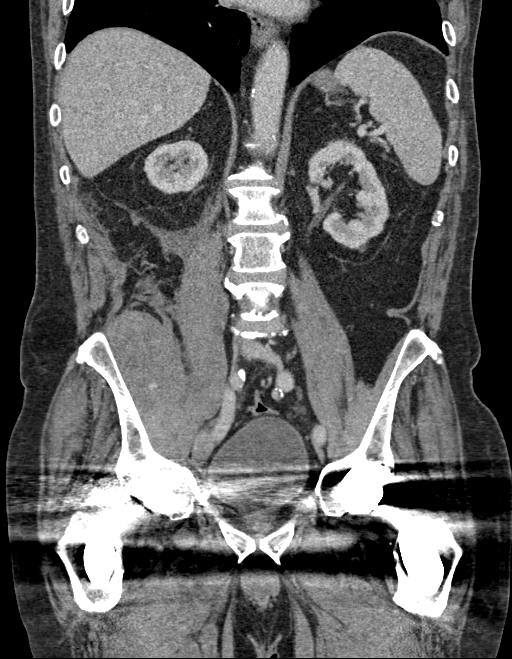

[13 of 46 positions shown; findings below may reference images not displayed]

FINDINGS: Lower chest: Bandlike areas of opacity in the lung bases, likely
atelectasis or scarring. No consolidation or effusion is seen.
Normal heart size. No pericardial effusion.

Hepatobiliary: No worrisome focal liver lesions. Smooth liver
surface contour. Normal hepatic attenuation. Physiologic distension
of the gallbladder. No calcified gallstones or pericholecystic
inflammation. No biliary ductal dilatation.

Pancreas: Partial fatty replacement of the pancreas. No pancreatic
ductal dilatation or surrounding inflammatory changes.

Spleen: Normal in size. No concerning splenic lesions.

Adrenals/Urinary Tract: Normal adrenal glands. Kidneys are normally
located with symmetric enhancement and excretion. No suspicious
renal lesion, urolithiasis or hydronephrosis. Moderate distention of
the urinary bladder without other gross abnormality.

Stomach/Bowel: Small sliding-type hiatal hernia. Distal stomach is
unremarkable. Intermediate attenuation fluid along the duodenal
sweep may be redistributed hemorrhage within the retroperitoneum. No
focal bowel wall and abnormality in this vicinity. No extraluminal
air. No small bowel thickening or dilatation. No colonic dilatation
or wall thickening. Trace free fluid is seen in the right paracolic
gutter, possibly redistributed as well. A normal appendix is
visualized. No colonic dilatation or wall thickening.

Vascular/Lymphatic: There are multiple serpiginous blushes of
hyperattenuation within the right iliacus musculature concerning for
sites of active contrast extravasation. No other sites concerning
for active contrast extravasation. No direct vascular injury is
identified. Atherosclerotic calcifications within the abdominal
aorta and branch vessels. No aneurysm or ectasia. No enlarged
abdominopelvic lymph nodes.

Reproductive: Prostate appears borderline enlarged with
heterogeneous calcifications though largely obscured by streak
artifact from bilateral hip prostheses. Trace right hydrocele is
noted. No other acute abnormality of the external genitalia.

Other: There is hemorrhage and fluid predominantly throughout the
right retroperitoneum which likely arises from the sites of active
extravasation in the right iliacus as detailed above. This fluid in
hemorrhage does appear to redistributed into the posterior and
anterior pararenal space as well as about the duodenum as denoted
above.

Musculoskeletal: There sites of contrast blush within the right
iliacus muscle with additional heterogeneous hemorrhage along the
psoas more proximally as well as what is likely a layering
intramuscular hemorrhage of the more inferior iliopsoas muscle
though partially obscured by adjacent streak artifact from the right
hip prosthesis, series 2, image 87.

Bilateral total hip arthroplasties in expected alignment. And
acetabular component fixation screw in the right stands proud of the
superior acetabular cortex (6/113) similarly, a fixation screw in
the left closely approximates the cortex along the left ilium
(6/100). No other acute or worrisome abnormalities of bony pelvis.

Dedicated lumbar reconstructions are generated and dictated
separately. Please see report for further details. In brief, there
appears to be multilevel discogenic and facet degenerative changes
as well as several small vertebral body hemangiomata but without
acute osseous injury or suspicious osseous lesion.
IMPRESSION: 1. Multiple serpiginous blushes of hyperattenuation within the right
iliacus muscle concerning for sites of active contrast extravasation
with extensive thickening and intramuscular hemorrhage. Layering
intramuscular hematoma is seen within the iliopsoas muscle of the
anterior proximal thigh as well as additional retroperitoneal
hemorrhage tracking superiorly along the psoas and distributed
throughout the right retroperitoneal spaces.
2. Hyperdense hemorrhage is present in the retroperitoneum along the
duodenal sweep though this is favored to be redistributed without
acute abnormality of the bowel or extraluminal gas.
3. Trace intraperitoneal fluid/hemorrhage in the right paracolic
gutter and deep pelvis, possibly redistributed as well.
4. No other acute abnormality in the abdomen or pelvis.
Specifically, the appendix is normal.
5. Prior bilateral total hip arthroplasties with resulting streak
across the pelvis and proximal thighs.
6. Dedicated lumbar reconstructions are generated and dictated
separately.
7. Aortic Atherosclerosis (65K1T-HOP.P).

These results were called by telephone at the time of interpretation
on 07/03/2020 at [DATE] to provider Dr Nalini, who verbally
acknowledged these results.

## 2022-01-19 ENCOUNTER — Encounter: Payer: Self-pay | Admitting: Vascular Surgery

## 2022-01-20 LAB — SERIAL MONITORING

## 2022-01-22 LAB — INFLIXIMAB+AB (SERIAL MONITOR)
Anti-Infliximab Antibody: <22 ng/mL
Infliximab Drug Level: 40 ug/mL

## 2022-01-25 ENCOUNTER — Telehealth: Payer: Self-pay

## 2022-01-25 NOTE — Telephone Encounter (Signed)
-----  Message from Mauri Pole, MD sent at 01/24/2022  3:49 PM EDT ----- ?Can you please check if this was a true drug trough or was it a random drug level? ?He is above  therapeutic range and no detectable antibodies. ?ESR is mildly elevated ?Please check if he submitted stool sample to exclude C. difficile colitis before we consider increasing dose of Remicade though at this point seems less likely given drug level is adequate ?

## 2022-01-25 NOTE — Telephone Encounter (Signed)
[facility] Rheumatology. Spoke with Rosann Auerbach. Patient was last infused with Remicade 5 mg/ kg on 12/29/21. He is infused every 8 weeks. ?Drug level was drawn on 01/17/22. ?I have reached out to the patient asking he submit the stool specimen. I am waiting for him to respond.  ?

## 2022-01-26 ENCOUNTER — Encounter: Payer: Self-pay | Admitting: Vascular Surgery

## 2022-01-26 ENCOUNTER — Other Ambulatory Visit: Payer: Self-pay

## 2022-01-26 DIAGNOSIS — K5100 Ulcerative (chronic) pancolitis without complications: Secondary | ICD-10-CM

## 2022-01-26 DIAGNOSIS — Z5181 Encounter for therapeutic drug level monitoring: Secondary | ICD-10-CM

## 2022-01-27 ENCOUNTER — Other Ambulatory Visit: Payer: Medicare Other

## 2022-01-27 DIAGNOSIS — K5100 Ulcerative (chronic) pancolitis without complications: Secondary | ICD-10-CM

## 2022-01-28 LAB — CLOSTRIDIUM DIFFICILE TOXIN B, QUALITATIVE, REAL-TIME PCR: Toxigenic C. Difficile by PCR: NOT DETECTED

## 2022-01-28 NOTE — Telephone Encounter (Signed)
Ok thank you.  He needs to have a true infliximab drug trough 1 day prior to next infusion.  Plan to keep the dose of infliximab at 5 mg/kg.  Await stool specimen to exclude C. difficile or acute infectious etiology. ?

## 2022-02-02 LAB — CALPROTECTIN, FECAL: Calprotectin, Fecal: 726 ug/g — ABNORMAL HIGH (ref 0–120)

## 2022-02-03 ENCOUNTER — Encounter: Payer: Self-pay | Admitting: Vascular Surgery

## 2022-02-04 NOTE — Telephone Encounter (Signed)
Fecal cal protectin is elevated consistent with uncontrolled IBD. If he is willing, please start Budesonide '9mg'$  daily X 30 days followed by taper '6mg'$  daily X 2 weeks and '3mg'$  daily X 2 weeks to help until we can get drug trough ? ?

## 2022-02-07 ENCOUNTER — Other Ambulatory Visit: Payer: Self-pay

## 2022-02-07 MED ORDER — BUDESONIDE 3 MG PO CPEP: ORAL_CAPSULE | ORAL | 0 refills | Status: DC

## 2022-02-07 NOTE — Telephone Encounter (Signed)
Communicated with the patient. He is agreeable to starting budesonide. Rx to [facility]. ?

## 2022-02-14 ENCOUNTER — Encounter: Payer: Self-pay | Admitting: Hematology and Oncology

## 2022-02-16 ENCOUNTER — Telehealth: Payer: Self-pay | Admitting: Hematology and Oncology

## 2022-02-16 NOTE — Telephone Encounter (Signed)
.  Called patient to schedule appointment per 5/16 inbasket, patient is aware of date and time.   ?

## 2022-02-17 ENCOUNTER — Other Ambulatory Visit: Payer: Medicare Other

## 2022-02-17 DIAGNOSIS — K5100 Ulcerative (chronic) pancolitis without complications: Secondary | ICD-10-CM

## 2022-02-17 DIAGNOSIS — Z5181 Encounter for therapeutic drug level monitoring: Secondary | ICD-10-CM

## 2022-02-21 ENCOUNTER — Inpatient Hospital Stay: Payer: Medicare Other | Admitting: Hematology and Oncology

## 2022-02-24 NOTE — Progress Notes (Signed)
Reviewed and agree with documentation and assessment and plan. K. Veena Zimere Dunlevy , MD   

## 2022-02-25 LAB — SERIAL MONITORING

## 2022-02-25 LAB — SPECIMEN STATUS REPORT

## 2022-02-25 LAB — INFLIXIMAB+AB (SERIAL MONITOR)
Anti-Infliximab Antibody: <22 ng/mL
Infliximab Drug Level: 13 ug/mL

## 2022-03-02 ENCOUNTER — Encounter: Payer: Self-pay | Admitting: Vascular Surgery

## 2022-03-02 ENCOUNTER — Ambulatory Visit (INDEPENDENT_AMBULATORY_CARE_PROVIDER_SITE_OTHER): Payer: Medicare Other | Admitting: Vascular Surgery

## 2022-03-02 VITALS — BP 126/73 | HR 71 | Temp 98.2°F | Resp 20 | Ht 73.0 in | Wt 204.0 lb

## 2022-03-02 DIAGNOSIS — I872 Venous insufficiency (chronic) (peripheral): Secondary | ICD-10-CM

## 2022-03-02 NOTE — Progress Notes (Signed)
Patient ID: Paul Luna, male   DOB: 20-Jul-1941, 81 y.o.   MRN: [id number]  Reason for Consult: Follow-up   Referred by Lilian Coma., MD  Subjective:     HPI:  Paul Luna is a 81 y.o. male without significant vascular history was recently seen for bilateral lower extremity swelling.  He has been laying and wearing compression stockings.  Is also lost 20 pounds intentionally states that his legs are much improved.  He does have residual swelling in the left lower extremity mostly has resolved in the right lower extremity.  No skin changes.  No ulceration.  Past Medical History:  Diagnosis Date   Adenomatous colon polyp    Anxiety    chronic   Arthritis    Bladder cancer ([place]) 1991   Clotting disorder ([place]) 2017   following hip surgery   HLD (hyperlipidemia)    Hypertension    UC (ulcerative colitis) ([place])    Family History  Problem Relation Age of Onset   Hypertension Mother    Heart disease Mother 25   Hypertension Father    Arthritis Father    Stroke Father 25   Colon cancer Brother 61   Colon polyps Brother    Heart disease Brother 53   Esophageal cancer Neg Hx    Rectal cancer Neg Hx    Stomach cancer Neg Hx    Liver cancer Neg Hx    Past Surgical History:  Procedure Laterality Date   bladder cancer surgery  1991   managed by urologist   COLONOSCOPY  2021   Hx of many colon's; last 6 months ago, todays is a follow up to the    COLONOSCOPY W/ BIOPSIES AND POLYPECTOMY     Hx: of   LUMBAR LAMINECTOMY/DECOMPRESSION MICRODISCECTOMY Right 04/08/2013   Procedure: LUMBAR FOUR-FIVE LUMBAR LAMINECTOMY/DECOMPRESSION MICRODISCECTOMY 1 LEVEL;  Surgeon: Charlie Pitter, MD;  Location: [facility];  Service: Neurosurgery;  Laterality: Right;   TOTAL HIP ARTHROPLASTY Right    TOTAL HIP ARTHROPLASTY Left     Short Social History:  Social History   Tobacco Use   Smoking status: Former    Types: Cigarettes    Quit date: 10/03/1989    Years since quitting: 32.4    Smokeless tobacco: Never  Substance Use Topics   Alcohol use: Yes    Comment: occ    No Known Allergies  Current Outpatient Medications  Medication Sig Dispense Refill   ascorbic acid (VITAMIN C) 500 MG tablet Take 500 mg by mouth daily.     budesonide (ENTOCORT EC) 3 MG 24 hr capsule 9 mg x 30 day then 6 mg x 14 days then 3 mg x 14 days 132 capsule 0   Calcium Carbonate (CALCIUM 600 PO) Take 1 tablet by mouth daily.     Cholecalciferol (VITAMIN D-3 PO) Take 1 tablet by mouth daily.     ezetimibe (ZETIA) 10 MG tablet Take 1 tablet by mouth daily.     folic acid (FOLVITE) 1 MG tablet Take 1 tablet by mouth daily.     furosemide (LASIX) 40 MG tablet Take 1 tablet (40 mg total) by mouth daily. 30 tablet    inFLIXimab (REMICADE) 100 MG injection See admin instructions.     losartan (COZAAR) 50 MG tablet Take 1 tablet (50 mg total) by mouth daily.     methotrexate 2.5 MG tablet TAKE 10 TABLETS BY MOUTH ONCE PER WEEK     rivaroxaban (XARELTO) 20 MG TABS tablet  20 mg daily.     sertraline (ZOLOFT) 100 MG tablet Take 200 mg by mouth daily.     simvastatin (ZOCOR) 40 MG tablet Take 1 tablet by mouth daily.     No current facility-administered medications for this visit.    Review of Systems  Constitutional:  Constitutional negative.      Intentional 20 pound weight loss.  HENT: HENT negative.  Eyes: Eyes negative.  Cardiovascular: Positive for leg swelling.  GI: Gastrointestinal negative.  Musculoskeletal: Musculoskeletal negative.  Skin: Skin negative.  Neurological: Neurological negative. Hematologic: Hematologic/lymphatic negative.  Psychiatric: Psychiatric negative.       Objective:  Objective   Vitals:   03/02/22 1010  BP: 126/73  Pulse: 71  Resp: 20  Temp: 98.2 F (36.8 C)  SpO2: 96%  Weight: 204 lb (92.5 kg)  Height: '6\' 1"'$  (1.854 m)   Body mass index is 26.91 kg/m.  Physical Exam HENT:     Head: Normocephalic.     Nose: Nose normal.  Eyes:     Pupils:  Pupils are equal, round, and reactive to light.  Cardiovascular:     Rate and Rhythm: Normal rate.     Pulses: Normal pulses.  Pulmonary:     Effort: Pulmonary effort is normal.  Abdominal:     General: Abdomen is flat.  Musculoskeletal:     Cervical back: Normal range of motion.     Right lower leg: No edema.     Left lower leg: Edema present.  Skin:    General: Skin is warm and dry.     Capillary Refill: Capillary refill takes less than 2 seconds.  Neurological:     General: No focal deficit present.     Mental Status: He is alert.  Psychiatric:        Mood and Affect: Mood normal.    Data: LEFT          Reflux NoRefluxReflux TimeDiameter cmsComments                           Yes                                    +--------------+---------+------+-----------+------------+---------+  CFV                     yes   >1 second                        +--------------+---------+------+-----------+------------+---------+  FV mid                  yes   >1 second                        +--------------+---------+------+-----------+------------+---------+  Popliteal               yes   >1 second                        +--------------+---------+------+-----------+------------+---------+  GSV at SFJ              yes    >500 ms      0.76               +--------------+---------+------+-----------+------------+---------+  GSV prox thigh          yes    >  500 ms      0.62               +--------------+---------+------+-----------+------------+---------+  GSV mid thigh           yes    >500 ms      0.60               +--------------+---------+------+-----------+------------+---------+  GSV dist thigh          yes    >500 ms      0.64               +--------------+---------+------+-----------+------------+---------+  GSV at knee             yes    >500 ms      0.61                +--------------+---------+------+-----------+------------+---------+  GSV prox calf           yes    >500 ms      0.55               +--------------+---------+------+-----------+------------+---------+  GSV mid calf            yes    >500 ms      0.46               +--------------+---------+------+-----------+------------+---------+  SSV Pop Fossa                                       too small  +--------------+---------+------+-----------+------------+---------+  SSV prox calf                                       too small  +--------------+---------+------+-----------+------------+---------+  SSV mid calf  no                            0.32               +--------------+---------+------+-----------+------------+---------+  AASV          no                            0.19               +--------------+---------+------+-----------+------------+---------+        Summary:  Left:  - No evidence of deep vein thrombosis seen in the left lower extremity,  from the common femoral through the popliteal veins.  - No evidence of superficial venous thrombosis in the left lower  extremity.  - 3.85 x 1.63 fluid collection visualized in the popliteal fossa.      Assessment/Plan:    81 year old male with known left greater saphenous vein reflux and swelling with much improvement in the right lower extremity and left lower extremity swelling since losing 20 pounds intentionally.  At this time given that his symptoms are minimal and skin all appears healthy and intact we do not need any laser ablation of his great saphenous vein but should he have recurrent pain or worsening swelling we could consider great saphenous vein ablation.  He will remain compliant with compression stockings I will see him on an as-needed basis     Waynetta Sandy MD [facility]

## 2022-03-11 NOTE — Telephone Encounter (Signed)
Patient has been seen in office

## 2022-03-24 ENCOUNTER — Other Ambulatory Visit: Payer: Self-pay | Admitting: Gastroenterology

## 2022-03-30 ENCOUNTER — Ambulatory Visit: Payer: Medicare Other | Admitting: Vascular Surgery

## 2022-04-15 ENCOUNTER — Ambulatory Visit: Payer: Medicare Other | Admitting: Physician Assistant

## 2022-04-20 ENCOUNTER — Ambulatory Visit: Payer: Medicare Other | Admitting: Physician Assistant

## 2022-04-27 ENCOUNTER — Encounter: Payer: Self-pay | Admitting: Physician Assistant

## 2022-04-27 ENCOUNTER — Ambulatory Visit (INDEPENDENT_AMBULATORY_CARE_PROVIDER_SITE_OTHER): Payer: Medicare Other | Admitting: Physician Assistant

## 2022-04-27 VITALS — BP 106/64 | HR 67 | Ht 73.0 in | Wt 189.0 lb

## 2022-04-27 DIAGNOSIS — K51018 Ulcerative (chronic) pancolitis with other complication: Secondary | ICD-10-CM | POA: Diagnosis not present

## 2022-04-27 MED ORDER — BUDESONIDE 3 MG PO CPEP: 9.0000 mg | ORAL_CAPSULE | Freq: Every day | ORAL | 1 refills | Status: DC

## 2022-04-27 NOTE — Progress Notes (Signed)
Chief Complaint: Follow up U.C with flare  HPI:   Mr. Rocchi is a 81 year old male, known to Dr. Silverio Decamp, with a past medical history of ulcerative pancolitis, colon polyps, diverticulosis, prostate cancer, DVT/PE on Xarelto and RA, who presents to clinic today for follow-up of U.C. with flare.      01/17/2022 patient saw Tye Savoy in clinic.  At that time noted he had been changed to Remicade by rheumatology to concurrently treat his RA in November 2022 he was having diarrhea at the time.  It was deemed that he was not getting a high enough dose of Remicade to treat his UC.  A C. difficile test was done and negative and fecal calprotectin was elevated.  Plan was to keep his Remicade 5 mg/kg.  He was started on Budesonide 9 mg x 30 days followed by a taper 6 mg x 2 weeks and then 3 mg x 2 weeks.    04/06/2022 patient reported that he had had an increased amount of diarrhea.    Today, the patient tells me that he has had a lot of difficulty with his UC.  He feels like before he was switched to Remicade things were under slightly better control.  Most recently since seeing Nevin Bloodgood in clinic he has had difficulty with diarrhea.  Tells me initially when he was started on the Budesonide 9 mg a day he thinks this maybe helped a little bit, but currently is down on 6 mg/day as he was told to taper off and is having a liquid loose bowel movement that is very urgent about every hour.  Tells me the only way to avoid this is to completely not eat.  Within the last month he has had 3 episodes of fecal incontinence as he could not make it to the bathroom, and that was while being at home close to the bathroom.    To complicate his picture patient tells me that he was switched to Remicade initially for his rheumatoid arthritis but they just did lab testing with an RA "prism test", which apparently tells them how well his body is reacting/if the medication is working for him and it was only at 10%.  Due to this they  stopped his Remicade which was due a few days ago.  Currently he is not on anything but the Budesonide.  They were going to communicate with Dr. Silverio Decamp in regards to what agent to use next.    Patient is from [place].  He and his wife met while he was vacationing in the [place].    Denies fever, chills, weight loss or blood in the stool.  Past Medical History:  Diagnosis Date   Adenomatous colon polyp    Anxiety    chronic   Arthritis    Bladder cancer ([place]) 1991   Clotting disorder ([place]) 2017   following hip surgery   HLD (hyperlipidemia)    Hypertension    UC (ulcerative colitis) ([place])     Past Surgical History:  Procedure Laterality Date   bladder cancer surgery  1991   managed by urologist   COLONOSCOPY  2021   Hx of many colon's; last 6 months ago, todays is a follow up to the    COLONOSCOPY W/ BIOPSIES AND POLYPECTOMY     Hx: of   LUMBAR LAMINECTOMY/DECOMPRESSION MICRODISCECTOMY Right 04/08/2013   Procedure: LUMBAR FOUR-FIVE LUMBAR LAMINECTOMY/DECOMPRESSION MICRODISCECTOMY 1 LEVEL;  Surgeon: Charlie Pitter, MD;  Location: [facility];  Service: Neurosurgery;  Laterality: Right;   TOTAL HIP ARTHROPLASTY Right    TOTAL HIP ARTHROPLASTY Left     Current Outpatient Medications  Medication Sig Dispense Refill   ascorbic acid (VITAMIN C) 500 MG tablet Take 500 mg by mouth daily.     budesonide (ENTOCORT EC) 3 MG 24 hr capsule TAKE 3 CAPSULES BY MOUTH DAILY FOR 30 DAYS, THEN TAKE 2 CAPSULES BY MOUTH DAILY FOR 14 DAYS, THEN TAKE 1 CAPSULE DAILY FOR 14 DAYS 132 capsule 0   Calcium Carbonate (CALCIUM 600 PO) Take 1 tablet by mouth daily.     Cholecalciferol (VITAMIN D-3 PO) Take 1 tablet by mouth daily.     ezetimibe (ZETIA) 10 MG tablet Take 1 tablet by mouth daily.     folic acid (FOLVITE) 1 MG tablet Take 1 tablet by mouth daily.     furosemide (LASIX) 40 MG tablet Take 1 tablet (40 mg total) by mouth daily. 30 tablet    inFLIXimab (REMICADE) 100 MG injection See admin  instructions.     losartan (COZAAR) 50 MG tablet Take 1 tablet (50 mg total) by mouth daily.     methotrexate 2.5 MG tablet TAKE 10 TABLETS BY MOUTH ONCE PER WEEK     rivaroxaban (XARELTO) 20 MG TABS tablet 20 mg daily.     sertraline (ZOLOFT) 100 MG tablet Take 200 mg by mouth daily.     simvastatin (ZOCOR) 40 MG tablet Take 1 tablet by mouth daily.     No current facility-administered medications for this visit.    Allergies as of 04/27/2022   (No Known Allergies)    Family History  Problem Relation Age of Onset   Hypertension Mother    Heart disease Mother 54   Hypertension Father    Arthritis Father    Stroke Father 60   Colon cancer Brother 94   Colon polyps Brother    Heart disease Brother 21   Esophageal cancer Neg Hx    Rectal cancer Neg Hx    Stomach cancer Neg Hx    Liver cancer Neg Hx     Social History   Socioeconomic History   Marital status: Married    Spouse name: Not on file   Number of children: 2   Years of education: Not on file   Highest education level: Not on file  Occupational History   Occupation: retired  Tobacco Use   Smoking status: Former    Types: Cigarettes    Quit date: 10/03/1989    Years since quitting: 32.5   Smokeless tobacco: Never  Vaping Use   Vaping Use: Never used  Substance and Sexual Activity   Alcohol use: Yes    Comment: occ   Drug use: No   Sexual activity: Yes    Partners: Male, Male    Birth control/protection: None  Other Topics Concern   Not on file  Social History Narrative   Not on file   Social Determinants of Health   Financial Resource Strain: Not on file  Food Insecurity: Not on file  Transportation Needs: Not on file  Physical Activity: Not on file  Stress: Not on file  Social Connections: Not on file  Intimate Partner Violence: Not on file    Review of Systems:    Constitutional: No weight loss, fever or chills Cardiovascular: No chest pain Respiratory: No SOB Gastrointestinal: See HPI  and otherwise negative   Physical Exam:  Vital signs: BP 106/64   Pulse 67   Ht 6'  1" (1.854 m)   Wt 189 lb (85.7 kg)   BMI 24.94 kg/m    Constitutional:   Very Pleasant Caucasian male appears to be in NAD, Well developed, Well nourished, alert and cooperative Respiratory: Respirations even and unlabored. Lungs clear to auscultation bilaterally.   No wheezes, crackles, or rhonchi.  Cardiovascular: Normal S1, S2. No MRG. Regular rate and rhythm. No peripheral edema, cyanosis or pallor.  Gastrointestinal:  Soft, nondistended, nontender. No rebound or guarding. Normal bowel sounds. No appreciable masses or hepatomegaly. Rectal:  Not performed.  Psychiatric: Demonstrates good judgement and reason without abnormal affect or behaviors.  See HPI for recent labs.  Assessment: 1.  Ulcerative pancolitis with flare: Patient has had trouble since late April when initial stool studies for C. difficile were negative, fecal calprotectin elevated, Remicade trough level was acceptable and patient was started on Budesonide 9 mg with a taper, currently still having trouble with uncontrollable urgent diarrhea 2.  Rheumatoid arthritis: Rheumatologist just stopped his Remicade as he was not responding to it.  They have not started another agent yet.  Plan: 1.  While we are waiting on an answer for what to use with the patient going forward will increase Budesonide back to 9 mg daily.  This seems to be helping somewhat.  Sent in a new prescription to the pharmacy. 2.  We will communicate with Dr. Silverio Decamp and have her reach out to the rheumatologist to agree on what to do going forward. 3.  Discussed that at one point Dr. Silverio Decamp had wanted to repeat a colonoscopy but the patient is currently not on any maintenance medicine and I am not sure how helpful this would be. 4.  Patient to follow in clinic per recommendations from Dr. Silverio Decamp.  Ellouise Newer, PA-C [facility] 04/27/2022, 1:39  PM  Cc: Lilian Coma., MD

## 2022-04-27 NOTE — Patient Instructions (Signed)
We have sent the following medications to your pharmacy for you to pick up at your convenience: Budesonide 9 mg daily.    If you are age 79 or older, your body mass index should be between 23-30. Your Body mass index is 24.94 kg/m. If this is out of the aforementioned range listed, please consider follow up with your Primary Care Provider.  If you are age 77 or younger, your body mass index should be between 19-25. Your Body mass index is 24.94 kg/m. If this is out of the aformentioned range listed, please consider follow up with your Primary Care Provider.   ________________________________________________________  The [facility] providers would like to encourage you to use [facility] to communicate with providers for non-urgent requests or questions.  Due to long hold times on the telephone, sending your provider a message by [facility] may be a faster and more efficient way to get a response.  Please allow 48 business hours for a response.  Please remember that this is for non-urgent requests.  _______________________________________________________

## 2022-05-12 ENCOUNTER — Other Ambulatory Visit: Payer: Self-pay | Admitting: Gastroenterology

## 2022-05-12 DIAGNOSIS — K51011 Ulcerative (chronic) pancolitis with rectal bleeding: Secondary | ICD-10-CM

## 2022-05-16 ENCOUNTER — Telehealth: Payer: Medicare Other | Admitting: Pharmacy Technician

## 2022-05-16 ENCOUNTER — Other Ambulatory Visit (HOSPITAL_COMMUNITY): Payer: Self-pay

## 2022-05-16 ENCOUNTER — Encounter: Payer: Self-pay | Admitting: Gastroenterology

## 2022-05-16 ENCOUNTER — Other Ambulatory Visit: Payer: Self-pay

## 2022-05-16 NOTE — Telephone Encounter (Signed)
Auth Submission: No PA required Payer: Medicare A&B / BCBS Supplement Medication & CPT/J Code(s) submitted: Skyrizi Infusion W0379 Approval from: 05/16/22 to 10/02/22 at [facility].  Specialty pharmacy team to obtain authoriation and approval for on body injector maintenance dose.

## 2022-05-17 NOTE — Telephone Encounter (Signed)
[place], Thanks! Prescription can be sent to [facility] when you guys are ready.

## 2022-05-18 ENCOUNTER — Other Ambulatory Visit (HOSPITAL_COMMUNITY): Payer: Self-pay

## 2022-05-18 ENCOUNTER — Other Ambulatory Visit: Payer: Self-pay

## 2022-05-18 MED ORDER — SKYRIZI 360 MG/2.4ML ~~LOC~~ SOCT
360.0000 mg | SUBCUTANEOUS | 6 refills | Status: DC
  Filled 2022-05-18: qty 2.4, fill #0

## 2022-05-18 NOTE — Telephone Encounter (Signed)
He will need Skyrizi induction dose 600 mg 0,4 and 8 weeks followed by maintenance dose 360 mg 12 weeks, thereafter every 8 weeks moderate to severe inflammatory bowel disease and also has history of inflammatory arthritis.  Thank you

## 2022-05-19 ENCOUNTER — Other Ambulatory Visit (HOSPITAL_COMMUNITY): Payer: Self-pay

## 2022-05-19 ENCOUNTER — Other Ambulatory Visit: Payer: Self-pay

## 2022-05-19 MED ORDER — SKYRIZI 360 MG/2.4ML ~~LOC~~ SOCT
360.0000 mg | SUBCUTANEOUS | 6 refills | Status: DC
  Filled 2022-05-19: qty 2.4, fill #0

## 2022-05-20 NOTE — Telephone Encounter (Signed)
Patient has been scheduled for his 3 infusions.

## 2022-05-22 NOTE — Progress Notes (Signed)
Patient Care Team: Lilian Coma., MD as PCP - General (Internal Medicine) Calvert Cantor, MD as Consulting Physician (Ophthalmology) Earnie Larsson, MD as Consulting Physician (Neurosurgery) Everardo Pacific, MD (Urology) Laurence Spates, MD (Inactive) as Consulting Physician (Gastroenterology)  DIAGNOSIS: No diagnosis found.  SUMMARY OF ONCOLOGIC HISTORY: Oncology History   No history exists.    CHIEF COMPLIANT: Follow-up of recurrent DVT    INTERVAL HISTORY: Paul Luna is a 81 y.o. with above-mentioned history of DVT and PE, and retroperitoneal hematoma, currently on Coumadin. He presents to the clinic today for follow-up.   ALLERGIES:  has No Known Allergies.  MEDICATIONS:  Current Outpatient Medications  Medication Sig Dispense Refill   ascorbic acid (VITAMIN C) 500 MG tablet Take 500 mg by mouth daily.     budesonide (ENTOCORT EC) 3 MG 24 hr capsule Take 3 capsules (9 mg total) by mouth daily. 90 capsule 1   Calcium Carbonate (CALCIUM 600 PO) Take 1 tablet by mouth daily.     Cholecalciferol (VITAMIN D-3 PO) Take 1 tablet by mouth daily.     ezetimibe (ZETIA) 10 MG tablet Take 1 tablet by mouth daily.     folic acid (FOLVITE) 1 MG tablet Take 1 tablet by mouth daily.     furosemide (LASIX) 40 MG tablet Take 1 tablet (40 mg total) by mouth daily. (Patient not taking: Reported on 04/27/2022) 30 tablet    losartan (COZAAR) 50 MG tablet Take 1 tablet (50 mg total) by mouth daily.     methotrexate 2.5 MG tablet TAKE 10 TABLETS BY MOUTH ONCE PER WEEK     rivaroxaban (XARELTO) 20 MG TABS tablet 20 mg daily.     sertraline (ZOLOFT) 100 MG tablet Take 200 mg by mouth daily.     simvastatin (ZOCOR) 40 MG tablet Take 1 tablet by mouth daily.     SKYRIZI 360 MG/2.4ML SOCT Inject 360 mg into the skin every 8 (eight) weeks. Dx K51.90 Ulcerative colitis F40.231 Trypanophobia 2.4 mL 6   No current facility-administered medications for this visit.    PHYSICAL EXAMINATION: ECOG  PERFORMANCE STATUS: {CHL ONC ECOG [phone]}  There were no vitals filed for this visit. There were no vitals filed for this visit.  BREAST:*** No palpable masses or nodules in either right or left breasts. No palpable axillary supraclavicular or infraclavicular adenopathy no breast tenderness or nipple discharge. (exam performed in the presence of a chaperone)  LABORATORY DATA:  I have reviewed the data as listed    Latest Ref Rng & Units 09/14/2020    8:06 AM 07/07/2020    4:03 AM 07/06/2020    4:19 AM  CMP  Glucose 70 - 99 mg/dL 89  130  131   BUN 6 - 23 mg/dL 19  28  19    Creatinine 0.40 - 1.50 mg/dL 1.01  0.96  0.80   Sodium 135 - 145 mEq/L 143  139  141   Potassium 3.5 - 5.1 mEq/L 3.6  4.1  3.7   Chloride 96 - 112 mEq/L 103  102  105   CO2 19 - 32 mEq/L 29  26  26    Calcium 8.4 - 10.5 mg/dL 9.2  8.4  8.1   Total Protein 6.0 - 8.3 g/dL 6.9     Total Bilirubin 0.2 - 1.2 mg/dL 0.5     Alkaline Phos 39 - 117 U/L 63     AST 0 - 37 U/L 14     ALT 0 - 53  U/L 19       Lab Results  Component Value Date   WBC 6.2 09/14/2020   HGB 15.9 09/14/2020   HCT 47.6 09/14/2020   MCV 91.6 09/14/2020   PLT 207.0 09/14/2020   NEUTROABS 4.4 09/14/2020    ASSESSMENT & PLAN:  No problem-specific Assessment & Plan notes found for this encounter.    No orders of the defined types were placed in this encounter.  The patient has a good understanding of the overall plan. he agrees with it. he will call with any problems that may develop before the next visit here. Total time spent: 30 mins including face to face time and time spent for planning, charting and co-ordination of care   Suzzette Righter, [place] 05/22/22    I Gardiner Coins am scribing for Dr. Lindi Adie  ***

## 2022-05-24 ENCOUNTER — Other Ambulatory Visit (HOSPITAL_COMMUNITY): Payer: Self-pay

## 2022-05-24 NOTE — Telephone Encounter (Signed)
Submitted a Prior Authorization request to [facility] for [place] via CoverMyMeds. Will update once we receive a response.   KEY: [id number]

## 2022-05-25 ENCOUNTER — Other Ambulatory Visit: Payer: Self-pay

## 2022-05-25 ENCOUNTER — Other Ambulatory Visit (HOSPITAL_COMMUNITY): Payer: Self-pay

## 2022-05-25 ENCOUNTER — Inpatient Hospital Stay: Payer: Medicare Other | Attending: Hematology and Oncology | Admitting: Hematology and Oncology

## 2022-05-25 DIAGNOSIS — Z86718 Personal history of other venous thrombosis and embolism: Secondary | ICD-10-CM | POA: Insufficient documentation

## 2022-05-25 DIAGNOSIS — I825Z9 Chronic embolism and thrombosis of unspecified deep veins of unspecified distal lower extremity: Secondary | ICD-10-CM | POA: Diagnosis not present

## 2022-05-25 DIAGNOSIS — Z86711 Personal history of pulmonary embolism: Secondary | ICD-10-CM | POA: Insufficient documentation

## 2022-05-25 DIAGNOSIS — Z7901 Long term (current) use of anticoagulants: Secondary | ICD-10-CM | POA: Insufficient documentation

## 2022-05-25 NOTE — Assessment & Plan Note (Deleted)
History of DVT on Coumadin previously Hospitalization October 2021: Retroperitoneal bleed extending into the thigh Severe intractable pain in the leg: Improved with muscle relaxants and discontinuation of Coumadin. 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  No evidence of common femoral vein occlusion on the right.  Leg swelling: I asked him to double up on his Lasix to 80 mg a day, leg elevation, started him on antibiotics with Keflex to prevent any cellulitis.  He will need to remain on anticoagulation for life. RTC in 1 year

## 2022-05-26 ENCOUNTER — Other Ambulatory Visit (HOSPITAL_COMMUNITY): Payer: Self-pay

## 2022-05-26 NOTE — Telephone Encounter (Signed)
Reached out and spoke with pt regarding PAP paperwork, explained that I would send it to their preferred email ([email or web address]). Confirmed email address.  Pt expressed understanding. Will f/u once paperwork has been signed and returned.

## 2022-05-26 NOTE — Telephone Encounter (Signed)
Patient Advocate Encounter  Prior Authorization for Paul Luna has been approved.   Effective: 10/03/2021 to 05/24/2023  Clista Bernhardt, CPhT Rx Patient Advocate Specialist Phone: [phone]

## 2022-05-31 NOTE — Telephone Encounter (Signed)
Spoke with pt on the phone just now. Pt is going to bring in financial documentation and Skrizi application to 3rd floor for [employer]. Please fax over all documents and completed application once received.

## 2022-06-02 ENCOUNTER — Ambulatory Visit (INDEPENDENT_AMBULATORY_CARE_PROVIDER_SITE_OTHER): Payer: Medicare Other

## 2022-06-02 VITALS — BP 118/71 | HR 67 | Temp 97.8°F | Resp 16 | Ht 73.0 in | Wt 189.8 lb

## 2022-06-02 DIAGNOSIS — K51011 Ulcerative (chronic) pancolitis with rectal bleeding: Secondary | ICD-10-CM

## 2022-06-02 MED ORDER — RISANKIZUMAB-RZAA 600 MG/10ML IV SOLN
600.0000 mg | Freq: Once | INTRAVENOUS | Status: AC
  Administered 2022-06-02: 600 mg via INTRAVENOUS
  Filled 2022-06-02: qty 10

## 2022-06-02 NOTE — Progress Notes (Signed)
Diagnosis: Ulcerative Pancolitis  Provider:  Marshell Garfinkel MD  Procedure: Infusion  IV Type: Peripheral, IV Location: R Antecubital  Skyrizi, Dose: 600  Infusion Start Time: 1046  Infusion Stop Time: 3149  Post Infusion IV Care: Observation period completed and Peripheral IV Discontinued  Discharge: Condition: Good, Destination: Home . AVS provided to patient.   Performed by:  Cleophus Molt, RN

## 2022-06-02 NOTE — Patient Instructions (Signed)
Risankizumab Injection What is this medication? RISANKIZUMAB (RIS an KIZ ue mab) treats autoimmune conditions, such as psoriasis, arthritis, and Crohn's disease. It works by slowing down an overactive immune system. It is a monoclonal antibody. This medicine may be used for other purposes; ask your health care provider or pharmacist if you have questions. COMMON BRAND NAME(S): Skyrizi What should I tell my care team before I take this medication? They need to know if you have any of these conditions: Hepatic disease Immune system problems Infection, such as tuberculosis (TB), bacterial, fungal or viral infections Recent or upcoming vaccine An unusual or allergic reaction to risankizumab, other medications, foods, dyes, or preservatives Pregnant or trying to get pregnant Breast-feeding How should I use this medication? This medication is injected into a vein or under the skin. It is given by your care team in a hospital or clinic setting. It may also be given at home. If you get this medication at home, you will be taught how to prepare and give it. Use exactly as directed. Take it as directed on the prescription label. Keep taking it unless your care team tells you to stop. If you use a pen, be sure to take off the outer needle cover before using the dose. It is important that you put your used needles and syringes in a special sharps container. Do not put them in a trash can. If you do not have a sharps container, call your pharmacist or care team to get one. A special MedGuide will be given to you by the pharmacist with each prescription and refill. Be sure to read this information carefully each time. This medication comes with INSTRUCTIONS FOR USE. Ask your pharmacist for directions on how to use this medication. Read the information carefully. Talk to your pharmacist or care team if you have questions. Talk to your care team about the use of this medication in children. Special care may be  needed. Overdosage: If you think you have taken too much of this medicine contact a poison control center or emergency room at once. NOTE: This medicine is only for you. Do not share this medicine with others. What if I miss a dose? It is important not to miss any doses. Talk to your care team about what to do if you miss a dose. What may interact with this medication? Do not take this medication with any of the following: Live vaccines This list may not describe all possible interactions. Give your health care provider a list of all the medicines, herbs, non-prescription drugs, or dietary supplements you use. Also tell them if you smoke, drink alcohol, or use illegal drugs. Some items may interact with your medicine. What should I watch for while using this medication? Visit your care team for regular checks on your progress. Tell your care team if your symptoms do not start to get better or if they get worse. You will be tested for tuberculosis (TB) before you start this medication. If your care team prescribes any medication for TB, you should start taking the TB medication before starting this medication. Make sure to finish the full course of TB medication. This medication may increase your risk of getting an infection. Call your care team for advice if you get a fever, chills, sore throat, or other symptoms of a cold or flu. Do not treat yourself. Try to avoid being around people who are sick. This medication can decrease the response to a vaccine. If you need to get  vaccinated, tell your care team if you have received this medication. Extra booster doses may be needed. Talk to your care team to see if a different vaccination schedule is needed. What side effects may I notice from receiving this medication? Side effects that you should report to your care team as soon as possible: Allergic reactions--skin rash, itching, hives, swelling of the face, lips, tongue, or throat Infection--fever,  chills, cough, sore throat, wounds that don't heal, pain or trouble when passing urine, general feeling of discomfort or being unwell Liver injury--right upper belly pain, loss of appetite, nausea, light-colored stool, dark yellow or brown urine, yellowing skin or eyes, unusual weakness or fatigue Side effects that usually do not require medical attention (report to your care team if they continue or are bothersome): Fatigue Headache Pain, redness, or irritation at injection site Runny or stuffy nose Sore throat This list may not describe all possible side effects. Call your doctor for medical advice about side effects. You may report side effects to FDA at 1-800-FDA-1088. Where should I keep my medication? Keep out of the reach of children and pets. Store in a refrigerator. Do not freeze. Protect from light. Keep it in the original carton until you are ready to take it. See product for storage information. Each product may have different instructions. Remove the dose from the carton about 30 to 45 minutes before it is time for you to take it. Get rid of any unused medication after the expiration date. To get rid of medications that are no longer needed or have expired: Take the medication to a medication take-back program. Check with your pharmacy or law enforcement to find a location. If you cannot return the medication, ask your pharmacist or care team how to get rid of this medication safely. NOTE: This sheet is a summary. It may not cover all possible information. If you have questions about this medicine, talk to your doctor, pharmacist, or health care provider.  2023 Elsevier/Gold Standard (2021-12-01 00:00:00)

## 2022-06-02 NOTE — Telephone Encounter (Addendum)
Financial documents have been received from patient. Application has been sent to [employer]. Will follow up with patient when application has been processed.

## 2022-06-03 ENCOUNTER — Encounter: Payer: Self-pay | Admitting: Physician Assistant

## 2022-06-03 ENCOUNTER — Other Ambulatory Visit (HOSPITAL_COMMUNITY): Payer: Self-pay

## 2022-06-03 NOTE — Telephone Encounter (Addendum)
.  A user error has taken place: error

## 2022-06-07 ENCOUNTER — Other Ambulatory Visit (HOSPITAL_COMMUNITY): Payer: Self-pay

## 2022-06-13 NOTE — Telephone Encounter (Signed)
Received a fax from  [place] regarding an approval for [facility] patient assistance from 9.11.23 to 12.31.24.   Phone number: [phone]

## 2022-06-30 ENCOUNTER — Ambulatory Visit (INDEPENDENT_AMBULATORY_CARE_PROVIDER_SITE_OTHER): Payer: Medicare Other

## 2022-06-30 VITALS — BP 114/52 | HR 66 | Temp 98.5°F | Resp 16 | Ht 73.0 in | Wt 189.2 lb

## 2022-06-30 DIAGNOSIS — K51011 Ulcerative (chronic) pancolitis with rectal bleeding: Secondary | ICD-10-CM | POA: Diagnosis not present

## 2022-06-30 MED ORDER — RISANKIZUMAB-RZAA 600 MG/10ML IV SOLN
600.0000 mg | Freq: Once | INTRAVENOUS | Status: AC
  Administered 2022-06-30: 600 mg via INTRAVENOUS
  Filled 2022-06-30: qty 10

## 2022-06-30 NOTE — Progress Notes (Signed)
Diagnosis: Ulcerative Colitis  Provider:  Marshell Garfinkel MD  Procedure: Infusion  IV Type: Peripheral, IV Location: L Forearm  Skyrizi, Dose: 652m  Infusion Start Time: 14932 Infusion Stop Time: 14199 Post Infusion IV Care: Peripheral IV Discontinued  Discharge: Condition: Good, Destination: Home . AVS provided to patient.   Performed by:  CKoren Shiver RN

## 2022-07-04 ENCOUNTER — Encounter: Payer: Self-pay | Admitting: Gastroenterology

## 2022-07-06 ENCOUNTER — Other Ambulatory Visit (HOSPITAL_COMMUNITY): Payer: Self-pay

## 2022-07-12 ENCOUNTER — Other Ambulatory Visit (HOSPITAL_COMMUNITY): Payer: Self-pay

## 2022-07-20 MED ORDER — BUDESONIDE 3 MG PO CPEP: 9.0000 mg | ORAL_CAPSULE | Freq: Every day | ORAL | 0 refills | Status: DC

## 2022-07-28 ENCOUNTER — Ambulatory Visit: Payer: Medicare Other

## 2022-08-01 ENCOUNTER — Ambulatory Visit (INDEPENDENT_AMBULATORY_CARE_PROVIDER_SITE_OTHER): Payer: Medicare Other

## 2022-08-01 VITALS — BP 128/74 | HR 66 | Temp 98.4°F | Resp 16 | Ht 73.0 in | Wt 192.2 lb

## 2022-08-01 DIAGNOSIS — K51011 Ulcerative (chronic) pancolitis with rectal bleeding: Secondary | ICD-10-CM | POA: Diagnosis not present

## 2022-08-01 MED ORDER — RISANKIZUMAB-RZAA 600 MG/10ML IV SOLN
600.0000 mg | Freq: Once | INTRAVENOUS | Status: AC
  Administered 2022-08-01: 600 mg via INTRAVENOUS
  Filled 2022-08-01: qty 10

## 2022-08-01 NOTE — Progress Notes (Signed)
Diagnosis: Ulcerative pancolitits with rectal bleeding   Provider:  Marshell Garfinkel MD  Procedure: Infusion  IV Type: Peripheral, IV Location: L Forearm  Skyrizi (risankizumab-rzaa), Dose: 600 mg  Infusion Start Time: 1947  Infusion Stop Time: 1252  Post Infusion IV Care: Peripheral IV Discontinued  Discharge: Condition: Good, Destination: Home . AVS provided to patient.   Performed by:  Adelina Mings, LPN

## 2022-08-22 ENCOUNTER — Other Ambulatory Visit: Payer: Self-pay

## 2022-08-22 MED ORDER — BUDESONIDE 3 MG PO CPEP: 9.0000 mg | ORAL_CAPSULE | Freq: Every day | ORAL | 0 refills | Status: DC

## 2022-08-24 ENCOUNTER — Encounter: Payer: Self-pay | Admitting: Gastroenterology

## 2022-08-29 ENCOUNTER — Ambulatory Visit: Payer: Medicare Other | Admitting: Gastroenterology

## 2022-08-29 NOTE — Progress Notes (Deleted)
Paul Luna    [id number]    1941-07-24  Primary Care Physician:[name], [name], MD  Referring Physician: Lilian Coma., MD [address] Kristeen Mans [place],  [place] [address]   Chief complaint:  ***  HPI:  81 year old very pleasant gentleman here for follow-up visit for ulcerative pancolitis He is doing overall well.  His symptoms are stable.  He is currently taking budesonide 3 mg daily   Denies any nausea, vomiting, abdominal pain, melena or bright red blood per rectum   He has acute DVT on left lower extremity.  Currently on warfarin   Colonoscopy June 11, 2020: Showed pancolitis, biopsies showed mildly active ulcerative colitis in the right colon and moderately active ulcerative colitis in the left colon.  Sigmoid diverticulosis and internal hemorrhoids   He has high out-of-pocket expense with Entyvio infusion, try to switch to Humira which is currently not covered under his prescription benefits and actually turned out to be more expensive.  Plan to continue Entyvio infusions for now.   He was  hospitalized in October 2021 with retroperitoneal bleed extending into the thigh, developed severe intractable pain in the leg.  Improved after holding Coumadin.  Developed new left lower extremity DVT and treated with Lovenox and restarted on low-dose Coumadin.  Lower extremity swelling has improved and he is doing better   Last office visit by Amy in July 2021, HPI below: He relocated from [place] to [place], and had been followed by Dr. Georgiann Mccoy in [place].  He was diagnosed with ulcerative colitis in 2016, had been treated with steroids initially and then budesonide which she has found helpful.  He was maintained on budesonide 3 mg/day.  He underwent follow-up colonoscopy in January 2021 with finding of Mayo score 1-2 pancolitis and also had several polyps removed.  Biopsies showed inflammatory polyps or serrated polyps stains were done for dysplasia  which returned showing 3 serrated polyps and 2 inflamed serrated polyps no dysplasia but felt possibility of dysplasia significant.  Follow-up was recommended in 6 months.  Path from the remainder of the colon showed moderate colitis .  He was advised at that time to start biologic therapy and initiated Entyvio in February 2021.  His last infusion was 03/26/2020. He says he had been relatively asymptomatic prior to starting the [facility] and has not noticed any real change in symptoms as he had not been having any abdominal pain diarrhea or bleeding.  He continues to feel well. Last labs on 03/19/2020 c-Met unremarkable and CBC in March 2021 within normal limits. Patient has history of recurrent DVT and PE and has been maintained on Coumadin for several years.  Also with history of hypertension, osteoarthritis, anxiety, history of bladder cancer, and has had 2 hip repairs. He is recently established with Dr. Quillian Quince [name] in [place] for primary care. Family history is positive for colon cancer in his brother.     Outpatient Encounter Medications as of 08/29/2022  Medication Sig   ascorbic acid (VITAMIN C) 500 MG tablet Take 500 mg by mouth daily.   budesonide (ENTOCORT EC) 3 MG 24 hr capsule Take 3 capsules (9 mg total) by mouth daily.   Calcium Carbonate (CALCIUM 600 PO) Take 1 tablet by mouth daily.   Cholecalciferol (VITAMIN D-3 PO) Take 1 tablet by mouth daily.   ezetimibe (ZETIA) 10 MG tablet Take 1 tablet by mouth daily.   folic acid (FOLVITE) 1 MG tablet Take 1 tablet  by mouth daily.   losartan (COZAAR) 50 MG tablet Take 1 tablet (50 mg total) by mouth daily.   methotrexate 2.5 MG tablet TAKE 10 TABLETS BY MOUTH ONCE PER WEEK   Risankizumab-rzaa (SKYRIZI) 360 MG/2.4ML SOCT Inject into the skin.   sertraline (ZOLOFT) 100 MG tablet Take 200 mg by mouth daily.   simvastatin (ZOCOR) 40 MG tablet Take 1 tablet by mouth daily.   No facility-administered encounter medications on file as of  08/29/2022.    Allergies as of 08/29/2022   (No Known Allergies)    Past Medical History:  Diagnosis Date   Adenomatous colon polyp    Anxiety    chronic   Arthritis    Bladder cancer ([place]) 1991   Clotting disorder ([place]) 2017   following hip surgery   HLD (hyperlipidemia)    Hypertension    Prostate cancer ([place]) 06/2021   UC (ulcerative colitis) ([place])     Past Surgical History:  Procedure Laterality Date   bladder cancer surgery  1991   managed by urologist   COLONOSCOPY  2021   Hx of many colon's; last 6 months ago, todays is a follow up to the    COLONOSCOPY W/ BIOPSIES AND POLYPECTOMY     Hx: of   LUMBAR LAMINECTOMY/DECOMPRESSION MICRODISCECTOMY Right 04/08/2013   Procedure: LUMBAR FOUR-FIVE LUMBAR LAMINECTOMY/DECOMPRESSION MICRODISCECTOMY 1 LEVEL;  Surgeon: Charlie Pitter, MD;  Location: [facility];  Service: Neurosurgery;  Laterality: Right;   TOTAL HIP ARTHROPLASTY Right    TOTAL HIP ARTHROPLASTY Left     Family History  Problem Relation Age of Onset   Hypertension Mother    Heart disease Mother 29   Hypertension Father    Arthritis Father    Stroke Father 105   Colon cancer Brother 40   Colon polyps Brother    Heart disease Brother 64   Esophageal cancer Neg Hx    Rectal cancer Neg Hx    Stomach cancer Neg Hx    Liver cancer Neg Hx     Social History   Socioeconomic History   Marital status: Married    Spouse name: Not on file   Number of children: 2   Years of education: Not on file   Highest education level: Not on file  Occupational History   Occupation: retired  Tobacco Use   Smoking status: Former    Types: Cigarettes    Quit date: 10/03/1989    Years since quitting: 32.9   Smokeless tobacco: Never  Vaping Use   Vaping Use: Never used  Substance and Sexual Activity   Alcohol use: Yes    Comment: occ   Drug use: No   Sexual activity: Yes    Partners: Male, Male    Birth control/protection: None  Other Topics Concern   Not on file   Social History Narrative   Not on file   Social Determinants of Health   Financial Resource Strain: Not on file  Food Insecurity: Not on file  Transportation Needs: Not on file  Physical Activity: Not on file  Stress: Not on file  Social Connections: Not on file  Intimate Partner Violence: Not on file      Review of systems: All other review of systems negative except as mentioned in the HPI.   Physical Exam: There were no vitals filed for this visit. There is no height or weight on file to calculate BMI. Gen:      No acute distress HEENT:  sclera  anicteric Abd:      soft, non-tender; no palpable masses, no distension Ext:    No edema Neuro: alert and oriented x 3 Psych: normal mood and affect  Data Reviewed:  Reviewed labs, radiology imaging, old records and pertinent past GI work up   Assessment and Plan/Recommendations:  ***  This visit required *** minutes of patient care (this includes precharting, chart review, review of results, face-to-face time used for counseling as well as treatment plan and follow-up. The patient was provided an opportunity to ask questions and all were answered. The patient agreed with the plan and demonstrated an understanding of the instructions.  Damaris Hippo , MD    CC: Lilian Coma., MD

## 2022-08-30 ENCOUNTER — Other Ambulatory Visit: Payer: Self-pay

## 2022-08-30 MED ORDER — BUDESONIDE 3 MG PO CPEP: 9.0000 mg | ORAL_CAPSULE | Freq: Every day | ORAL | 1 refills | Status: AC

## 2023-06-01 ENCOUNTER — Other Ambulatory Visit (HOSPITAL_COMMUNITY): Payer: Self-pay

## 2023-09-09 ENCOUNTER — Other Ambulatory Visit: Payer: Self-pay | Admitting: Gastroenterology
